# Patient Record
Sex: Female | Born: 1985
Health system: Southern US, Community
[De-identification: ages and names within clinical notes are randomized; demographics above are authoritative.]

## PROBLEM LIST (undated history)

## (undated) DIAGNOSIS — F4310 Post-traumatic stress disorder, unspecified: Secondary | ICD-10-CM

## (undated) DIAGNOSIS — Z9189 Other specified personal risk factors, not elsewhere classified: Secondary | ICD-10-CM

## (undated) DIAGNOSIS — IMO0002 Reserved for concepts with insufficient information to code with codable children: Secondary | ICD-10-CM

## (undated) DIAGNOSIS — F329 Major depressive disorder, single episode, unspecified: Secondary | ICD-10-CM

## (undated) DIAGNOSIS — F32A Depression, unspecified: Secondary | ICD-10-CM

## (undated) DIAGNOSIS — N12 Tubulo-interstitial nephritis, not specified as acute or chronic: Secondary | ICD-10-CM

## (undated) DIAGNOSIS — F419 Anxiety disorder, unspecified: Secondary | ICD-10-CM

## (undated) DIAGNOSIS — R51 Headache: Secondary | ICD-10-CM

## (undated) DIAGNOSIS — R6250 Unspecified lack of expected normal physiological development in childhood: Secondary | ICD-10-CM

## (undated) DIAGNOSIS — F909 Attention-deficit hyperactivity disorder, unspecified type: Secondary | ICD-10-CM

## (undated) DIAGNOSIS — K219 Gastro-esophageal reflux disease without esophagitis: Secondary | ICD-10-CM

## (undated) DIAGNOSIS — G4730 Sleep apnea, unspecified: Secondary | ICD-10-CM

## (undated) HISTORY — DX: Unspecified lack of expected normal physiological development in childhood: R62.50

## (undated) HISTORY — DX: Depression, unspecified: F32.A

## (undated) HISTORY — DX: Gastro-esophageal reflux disease without esophagitis: K21.9

## (undated) HISTORY — DX: Anxiety disorder, unspecified: F41.9

## (undated) HISTORY — DX: Sleep apnea, unspecified: G47.30

## (undated) HISTORY — DX: Major depressive disorder, single episode, unspecified: F32.9

## (undated) HISTORY — PX: CHOLECYSTECTOMY: SHX55

## (undated) HISTORY — DX: Post-traumatic stress disorder, unspecified: F43.10

## (undated) HISTORY — PX: UMBILICAL HERNIA REPAIR: SHX196

## (undated) HISTORY — PX: WISDOM TOOTH EXTRACTION: SHX21

## (undated) HISTORY — DX: Reserved for concepts with insufficient information to code with codable children: IMO0002

## (undated) HISTORY — DX: Headache: R51

## (undated) HISTORY — DX: Other specified personal risk factors, not elsewhere classified: Z91.89

## (undated) HISTORY — DX: Tubulo-interstitial nephritis, not specified as acute or chronic: N12

## (undated) HISTORY — DX: Attention-deficit hyperactivity disorder, unspecified type: F90.9

## (undated) HISTORY — PX: ENDOMETRIAL ABLATION: SHX621

## (undated) HISTORY — PX: TONSILLECTOMY: SUR1361

---
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
## 2005-08-12 ENCOUNTER — Ambulatory Visit: Payer: Self-pay

## 2005-08-20 ENCOUNTER — Ambulatory Visit: Payer: Self-pay

## 2005-09-27 ENCOUNTER — Emergency Department: Payer: Self-pay | Admitting: Emergency Medicine

## 2005-10-01 ENCOUNTER — Emergency Department: Payer: Self-pay | Admitting: Unknown Physician Specialty

## 2005-11-13 ENCOUNTER — Emergency Department: Payer: Self-pay | Admitting: Emergency Medicine

## 2005-11-15 ENCOUNTER — Emergency Department: Payer: Self-pay | Admitting: Internal Medicine

## 2006-11-29 ENCOUNTER — Emergency Department: Payer: Self-pay | Admitting: Internal Medicine

## 2008-04-23 ENCOUNTER — Emergency Department (HOSPITAL_COMMUNITY): Admission: EM | Admit: 2008-04-23 | Discharge: 2008-04-23 | Payer: Self-pay | Admitting: Emergency Medicine

## 2008-06-12 ENCOUNTER — Emergency Department (HOSPITAL_COMMUNITY): Admission: EM | Admit: 2008-06-12 | Discharge: 2008-06-13 | Payer: Self-pay | Admitting: Emergency Medicine

## 2009-07-17 ENCOUNTER — Ambulatory Visit: Payer: Self-pay | Admitting: Gastroenterology

## 2010-09-06 ENCOUNTER — Emergency Department: Payer: Self-pay | Admitting: Emergency Medicine

## 2010-09-10 DIAGNOSIS — K5909 Other constipation: Secondary | ICD-10-CM | POA: Insufficient documentation

## 2010-10-02 DIAGNOSIS — R635 Abnormal weight gain: Secondary | ICD-10-CM | POA: Insufficient documentation

## 2011-01-08 ENCOUNTER — Ambulatory Visit: Payer: Self-pay | Admitting: Nurse Practitioner

## 2011-01-10 DIAGNOSIS — K449 Diaphragmatic hernia without obstruction or gangrene: Secondary | ICD-10-CM | POA: Insufficient documentation

## 2011-01-27 ENCOUNTER — Ambulatory Visit: Payer: Self-pay | Admitting: Gastroenterology

## 2011-01-31 ENCOUNTER — Ambulatory Visit: Payer: Self-pay | Admitting: Gastroenterology

## 2011-04-01 ENCOUNTER — Ambulatory Visit: Payer: Self-pay | Admitting: Gastroenterology

## 2011-04-03 LAB — PATHOLOGY REPORT

## 2011-07-22 ENCOUNTER — Emergency Department: Payer: Self-pay | Admitting: *Deleted

## 2011-09-03 LAB — URINE MICROSCOPIC-ADD ON

## 2011-09-03 LAB — COMPREHENSIVE METABOLIC PANEL
ALT: 19
Alkaline Phosphatase: 82
BUN: 14
CO2: 25
Calcium: 9.9
GFR calc non Af Amer: >60
Glucose, Bld: 109 — ABNORMAL HIGH
Potassium: 4.7
Sodium: 136

## 2011-09-03 LAB — LIPASE, BLOOD: Lipase: 17

## 2011-09-03 LAB — CBC
HCT: 41.8
Hemoglobin: 14.9
MCHC: 35.5
RBC: 4.36

## 2011-09-03 LAB — URINALYSIS, ROUTINE W REFLEX MICROSCOPIC
Ketones, ur: 40 — AB
Leukocytes, UA: NEGATIVE
Nitrite: NEGATIVE
Protein, ur: 30 — AB
pH: 5.5

## 2011-09-03 LAB — DIFFERENTIAL
Basophils Absolute: 0
Basophils Relative: 0
Eosinophils Absolute: 0
Neutro Abs: 13.9 — ABNORMAL HIGH
Neutrophils Relative %: 86 — ABNORMAL HIGH

## 2011-09-04 LAB — POCT PREGNANCY, URINE
Operator id: 211881
Preg Test, Ur: NEGATIVE

## 2011-10-09 ENCOUNTER — Ambulatory Visit: Payer: Self-pay | Admitting: Otolaryngology

## 2011-10-21 DIAGNOSIS — F4310 Post-traumatic stress disorder, unspecified: Secondary | ICD-10-CM | POA: Insufficient documentation

## 2011-12-12 DIAGNOSIS — E229 Hyperfunction of pituitary gland, unspecified: Secondary | ICD-10-CM | POA: Diagnosis not present

## 2011-12-18 DIAGNOSIS — F330 Major depressive disorder, recurrent, mild: Secondary | ICD-10-CM | POA: Diagnosis not present

## 2011-12-18 DIAGNOSIS — F4310 Post-traumatic stress disorder, unspecified: Secondary | ICD-10-CM | POA: Diagnosis not present

## 2011-12-18 DIAGNOSIS — F70 Mild intellectual disabilities: Secondary | ICD-10-CM | POA: Diagnosis not present

## 2011-12-19 DIAGNOSIS — E229 Hyperfunction of pituitary gland, unspecified: Secondary | ICD-10-CM | POA: Diagnosis not present

## 2011-12-19 DIAGNOSIS — E669 Obesity, unspecified: Secondary | ICD-10-CM | POA: Diagnosis not present

## 2011-12-25 ENCOUNTER — Ambulatory Visit: Payer: Self-pay

## 2011-12-25 DIAGNOSIS — E229 Hyperfunction of pituitary gland, unspecified: Secondary | ICD-10-CM | POA: Diagnosis not present

## 2011-12-25 DIAGNOSIS — Z3200 Encounter for pregnancy test, result unknown: Secondary | ICD-10-CM | POA: Diagnosis not present

## 2011-12-25 LAB — HCG, QUANTITATIVE, PREGNANCY: Beta Hcg, Quant.: <1 m[IU]/mL — ABNORMAL LOW

## 2011-12-30 DIAGNOSIS — F4310 Post-traumatic stress disorder, unspecified: Secondary | ICD-10-CM | POA: Diagnosis not present

## 2011-12-30 DIAGNOSIS — F70 Mild intellectual disabilities: Secondary | ICD-10-CM | POA: Diagnosis not present

## 2011-12-30 DIAGNOSIS — F330 Major depressive disorder, recurrent, mild: Secondary | ICD-10-CM | POA: Diagnosis not present

## 2012-01-06 DIAGNOSIS — R350 Frequency of micturition: Secondary | ICD-10-CM | POA: Diagnosis not present

## 2012-01-06 DIAGNOSIS — R609 Edema, unspecified: Secondary | ICD-10-CM | POA: Diagnosis not present

## 2012-01-06 DIAGNOSIS — R0989 Other specified symptoms and signs involving the circulatory and respiratory systems: Secondary | ICD-10-CM | POA: Diagnosis not present

## 2012-01-06 DIAGNOSIS — R52 Pain, unspecified: Secondary | ICD-10-CM | POA: Diagnosis not present

## 2012-01-06 DIAGNOSIS — R0602 Shortness of breath: Secondary | ICD-10-CM | POA: Diagnosis not present

## 2012-01-06 DIAGNOSIS — R0609 Other forms of dyspnea: Secondary | ICD-10-CM | POA: Diagnosis not present

## 2012-01-06 DIAGNOSIS — R000 Tachycardia, unspecified: Secondary | ICD-10-CM | POA: Diagnosis not present

## 2012-01-06 DIAGNOSIS — R069 Unspecified abnormalities of breathing: Secondary | ICD-10-CM | POA: Diagnosis not present

## 2012-01-09 DIAGNOSIS — E669 Obesity, unspecified: Secondary | ICD-10-CM | POA: Diagnosis not present

## 2012-01-09 DIAGNOSIS — L600 Ingrowing nail: Secondary | ICD-10-CM | POA: Diagnosis not present

## 2012-01-09 DIAGNOSIS — E229 Hyperfunction of pituitary gland, unspecified: Secondary | ICD-10-CM | POA: Diagnosis not present

## 2012-01-09 DIAGNOSIS — N915 Oligomenorrhea, unspecified: Secondary | ICD-10-CM | POA: Diagnosis not present

## 2012-01-09 DIAGNOSIS — Z7721 Contact with and (suspected) exposure to potentially hazardous body fluids: Secondary | ICD-10-CM | POA: Diagnosis not present

## 2012-01-12 DIAGNOSIS — R0989 Other specified symptoms and signs involving the circulatory and respiratory systems: Secondary | ICD-10-CM | POA: Diagnosis not present

## 2012-01-12 DIAGNOSIS — R609 Edema, unspecified: Secondary | ICD-10-CM | POA: Diagnosis not present

## 2012-01-12 DIAGNOSIS — I471 Supraventricular tachycardia, unspecified: Secondary | ICD-10-CM | POA: Diagnosis not present

## 2012-01-12 DIAGNOSIS — R635 Abnormal weight gain: Secondary | ICD-10-CM | POA: Diagnosis not present

## 2012-01-12 DIAGNOSIS — R209 Unspecified disturbances of skin sensation: Secondary | ICD-10-CM | POA: Diagnosis not present

## 2012-01-12 DIAGNOSIS — R0609 Other forms of dyspnea: Secondary | ICD-10-CM | POA: Diagnosis not present

## 2012-01-14 DIAGNOSIS — I5022 Chronic systolic (congestive) heart failure: Secondary | ICD-10-CM | POA: Diagnosis not present

## 2012-01-14 DIAGNOSIS — R0602 Shortness of breath: Secondary | ICD-10-CM | POA: Diagnosis not present

## 2012-01-14 DIAGNOSIS — G4730 Sleep apnea, unspecified: Secondary | ICD-10-CM | POA: Diagnosis not present

## 2012-01-14 DIAGNOSIS — I119 Hypertensive heart disease without heart failure: Secondary | ICD-10-CM | POA: Diagnosis not present

## 2012-01-16 DIAGNOSIS — F70 Mild intellectual disabilities: Secondary | ICD-10-CM | POA: Diagnosis not present

## 2012-01-16 DIAGNOSIS — F4310 Post-traumatic stress disorder, unspecified: Secondary | ICD-10-CM | POA: Diagnosis not present

## 2012-01-16 DIAGNOSIS — F330 Major depressive disorder, recurrent, mild: Secondary | ICD-10-CM | POA: Diagnosis not present

## 2012-01-27 ENCOUNTER — Encounter: Payer: Self-pay | Admitting: Internal Medicine

## 2012-01-27 ENCOUNTER — Ambulatory Visit (INDEPENDENT_AMBULATORY_CARE_PROVIDER_SITE_OTHER): Payer: Medicare Other | Admitting: Internal Medicine

## 2012-01-27 DIAGNOSIS — IMO0001 Reserved for inherently not codable concepts without codable children: Secondary | ICD-10-CM | POA: Diagnosis not present

## 2012-01-27 DIAGNOSIS — J309 Allergic rhinitis, unspecified: Secondary | ICD-10-CM | POA: Diagnosis not present

## 2012-01-27 DIAGNOSIS — R6250 Unspecified lack of expected normal physiological development in childhood: Secondary | ICD-10-CM | POA: Diagnosis not present

## 2012-01-27 DIAGNOSIS — H6690 Otitis media, unspecified, unspecified ear: Secondary | ICD-10-CM | POA: Diagnosis not present

## 2012-01-27 DIAGNOSIS — H6693 Otitis media, unspecified, bilateral: Secondary | ICD-10-CM

## 2012-01-27 DIAGNOSIS — M791 Myalgia, unspecified site: Secondary | ICD-10-CM | POA: Insufficient documentation

## 2012-01-27 MED ORDER — AZITHROMYCIN 250 MG PO TABS: ORAL_TABLET | ORAL | Status: AC

## 2012-01-27 MED ORDER — DIPHENHYDRAMINE HCL 25 MG PO TABS: 25.0000 mg | ORAL_TABLET | Freq: Every evening | ORAL | Status: DC | PRN

## 2012-01-27 NOTE — Assessment & Plan Note (Signed)
Parents description of sneezing at home seems most consistent with allergic rhinitis. They report no improvement with the use of Claritin or Zyrtec. She has also tried nasal steroids with no improvement. Will try Benadryl at bedtime to see if this helps. Followup in 2 weeks.
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
## 2012-01-27 NOTE — Progress Notes (Signed)
Subjective:    Patient ID: Jamie Ross, female    DOB: 07-01-1986, 26 y.o.   MRN: [id number]  HPI 26 year old female with history of developmental delay presents to establish care. She is with her adoptive parents today. They report that she has had complains of diffuse myalgia over the last 5-6 months. It was initially thought that she might have influenza, but testing for this is negative. She was then evaluated by both endocrinology and cardiology and they report that workup was unremarkable. They question whether some of her medications, particularly her psychiatric medications may be contributing. They're unsure what lab work has been completed to date. She has not had fever or chills. She has not had weight loss.  She also complains today of a several day history of bilateral ear pain and sore throat. Her parents note that she frequently coughs, but this cough is nonproductive. They also notes frequent sneezing at night time. They have tried using Claritin and Zyrtec with no improvement. She has not had any recent fever or chills. They note that she has had recurrent ear infections in the past and has responded well to treatment with azithromycin.  Outpatient Encounter Prescriptions as of 01/27/2012  Medication Sig Dispense Refill  . azithromycin (ZITHROMAX Z-PAK) 250 MG tablet Take 2 pills day 1, then 1 pill daily days 2-5  6 each  0  . benzonatate (TESSALON) 200 MG capsule Take 100-200 mg by mouth 3 (three) times daily as needed.      . chlorproMAZINE (THORAZINE) 200 MG tablet Take 200 mg by mouth at bedtime.      . chlorproMAZINE (THORAZINE) 25 MG tablet Take by mouth as directed. 2 tab in AM and 4 tabs at bedtime      . clonazePAM (KLONOPIN) 0.5 MG tablet Take 0.5 mg by mouth 2 (two) times daily.      . diphenhydrAMINE (BENADRYL) 25 MG tablet Take 1 tablet (25 mg total) by mouth at bedtime as needed for allergies.  30 tablet  0  . drospirenone-ethinyl estradiol (LORYNA) 3-0.02 MG  tablet Take 1 tablet by mouth daily.      . DULoxetine (CYMBALTA) 60 MG capsule Take 60 mg by mouth daily.      . furosemide (LASIX) 40 MG tablet Take 40 mg by mouth daily.      . hydrochlorothiazide (MICROZIDE) 12.5 MG capsule Take 12.5 mg by mouth daily.      . pantoprazole (PROTONIX) 40 MG tablet Take 40 mg by mouth 2 (two) times daily.      . polyethylene glycol (MIRALAX / GLYCOLAX) packet Take 17 g by mouth 2 (two) times daily.      . potassium chloride SA (K-DUR,KLOR-CON) 20 MEQ tablet Take 20 mEq by mouth daily.      . prazosin (MINIPRESS) 2 MG capsule Take 2 mg by mouth at bedtime.      . psyllium (METAMUCIL) 58.6 % powder Take 1 packet by mouth 2 (two) times daily.      Marland Kitchen topiramate (TOPAMAX) 25 MG capsule Take 25 mg by mouth 2 (two) times daily.      . traMADol (ULTRAM) 50 MG tablet Take 50 mg by mouth every 6 (six) hours as needed.        Review of Systems  Constitutional: Negative for fever, chills, appetite change, fatigue and unexpected weight change.  HENT: Positive for ear pain and sore throat. Negative for congestion, trouble swallowing, neck pain, voice change and sinus pressure.   Eyes:  Negative for visual disturbance.  Respiratory: Positive for cough. Negative for shortness of breath, wheezing and stridor.   Cardiovascular: Negative for chest pain, palpitations and leg swelling.  Gastrointestinal: Negative for nausea, vomiting, abdominal pain, diarrhea, constipation, blood in stool, abdominal distention and anal bleeding.  Genitourinary: Negative for dysuria and flank pain.  Musculoskeletal: Negative for myalgias, arthralgias and gait problem.  Skin: Negative for color change and rash.  Neurological: Negative for dizziness and headaches.  Hematological: Negative for adenopathy. Does not bruise/bleed easily.  Psychiatric/Behavioral: Positive for behavioral problems, confusion, decreased concentration and agitation. Negative for suicidal ideas, sleep disturbance and  dysphoric mood. The patient is not nervous/anxious.    BP 128/82  Pulse 144  Temp(Src) 98 F (36.7 C) (Oral)  Ht 5' 1.5" (1.562 m)  Wt 208 lb (94.348 kg)  BMI 38.66 kg/m2  SpO2 95%     Objective:   Physical Exam  Constitutional: She is oriented to person, place, and time. She appears well-developed and well-nourished. No distress.  HENT:  Head: Normocephalic and atraumatic.  Right Ear: External ear normal. Tympanic membrane is bulging. Tympanic membrane is not erythematous. A middle ear effusion is present.  Left Ear: External ear normal. Tympanic membrane is bulging. Tympanic membrane is not erythematous. A middle ear effusion is present.  Nose: Nose normal.  Mouth/Throat: Oropharynx is clear and moist. No oropharyngeal exudate.  Eyes: Conjunctivae are normal. Pupils are equal, round, and reactive to light. Right eye exhibits no discharge. Left eye exhibits no discharge. No scleral icterus.  Neck: Normal range of motion. Neck supple. No tracheal deviation present. No thyromegaly present.  Cardiovascular: Normal rate, regular rhythm, normal heart sounds and intact distal pulses.  Exam reveals no gallop and no friction rub.   No murmur heard. Pulmonary/Chest: Effort normal and breath sounds normal. No respiratory distress. She has no wheezes. She has no rales. She exhibits no tenderness.  Musculoskeletal: Normal range of motion. She exhibits no edema and no tenderness.  Lymphadenopathy:    She has no cervical adenopathy.  Neurological: She is alert and oriented to person, place, and time. No cranial nerve deficit. She exhibits normal muscle tone. Coordination normal.  Skin: Skin is warm and dry. No rash noted. She is not diaphoretic. No erythema. No pallor.  Psychiatric: She has a normal mood and affect. Her behavior is normal. Judgment and thought content normal.          Assessment & Plan:

## 2012-01-27 NOTE — Assessment & Plan Note (Signed)
Parents describe symptoms of myalgia which have been persistent over 5-6 months. They note that extensive workup has been completed for this by her endocrinologist and cardiologist. Will request records on this. Question if some of her medications might be contributing. Additional workup to be determined by previous evaluation.

## 2012-01-27 NOTE — Assessment & Plan Note (Signed)
Symptoms and exam consistent with bilateral otitis media. Will treat with azithromycin. Patient will followup in 2 weeks. Her parents will call sooner if any worsening of symptoms.

## 2012-02-02 ENCOUNTER — Encounter: Payer: Self-pay | Admitting: *Deleted

## 2012-02-04 ENCOUNTER — Telehealth: Payer: Self-pay | Admitting: Internal Medicine

## 2012-02-04 NOTE — Telephone Encounter (Signed)
Pt mother called wanting to get ms Guinta an appointment for a mammogram they heard on tv that people 25 should get a mammogram.  Someone called from [employer] called stated that ms Cheese  should get a mammogram they didn't know the person name or what company they were from [phone]

## 2012-02-05 DIAGNOSIS — F330 Major depressive disorder, recurrent, mild: Secondary | ICD-10-CM | POA: Diagnosis not present

## 2012-02-05 DIAGNOSIS — F70 Mild intellectual disabilities: Secondary | ICD-10-CM | POA: Diagnosis not present

## 2012-02-05 DIAGNOSIS — F4310 Post-traumatic stress disorder, unspecified: Secondary | ICD-10-CM | POA: Diagnosis not present

## 2012-02-06 NOTE — Telephone Encounter (Signed)
Mother informed.

## 2012-02-06 NOTE — Telephone Encounter (Signed)
Mammograms are not currently recommended for women aged 25YO.  Current guidelines recommend starting at age 13 or 56.  There are some rare exceptions to this, such as for patients who have had cancer, but these would not apply to her.

## 2012-02-12 ENCOUNTER — Telehealth: Payer: Self-pay | Admitting: *Deleted

## 2012-02-12 ENCOUNTER — Encounter: Payer: Self-pay | Admitting: Internal Medicine

## 2012-02-12 ENCOUNTER — Ambulatory Visit (INDEPENDENT_AMBULATORY_CARE_PROVIDER_SITE_OTHER): Payer: Medicare Other | Admitting: Internal Medicine

## 2012-02-12 VITALS — BP 118/72 | HR 150 | Temp 97.8°F | Wt 212.0 lb

## 2012-02-12 DIAGNOSIS — R079 Chest pain, unspecified: Secondary | ICD-10-CM | POA: Diagnosis not present

## 2012-02-12 DIAGNOSIS — N6459 Other signs and symptoms in breast: Secondary | ICD-10-CM | POA: Diagnosis not present

## 2012-02-12 DIAGNOSIS — D510 Vitamin B12 deficiency anemia due to intrinsic factor deficiency: Secondary | ICD-10-CM | POA: Diagnosis not present

## 2012-02-12 DIAGNOSIS — R000 Tachycardia, unspecified: Secondary | ICD-10-CM | POA: Diagnosis not present

## 2012-02-12 DIAGNOSIS — G589 Mononeuropathy, unspecified: Secondary | ICD-10-CM | POA: Diagnosis not present

## 2012-02-12 DIAGNOSIS — M791 Myalgia, unspecified site: Secondary | ICD-10-CM

## 2012-02-12 DIAGNOSIS — R6250 Unspecified lack of expected normal physiological development in childhood: Secondary | ICD-10-CM

## 2012-02-12 DIAGNOSIS — E039 Hypothyroidism, unspecified: Secondary | ICD-10-CM

## 2012-02-12 DIAGNOSIS — G629 Polyneuropathy, unspecified: Secondary | ICD-10-CM | POA: Insufficient documentation

## 2012-02-12 LAB — CBC WITH DIFFERENTIAL/PLATELET
Basophils Relative: 0.2 % (ref 0.0–3.0)
Eosinophils Relative: 2.2 % (ref 0.0–5.0)
HCT: 36.5 % (ref 36.0–46.0)
Hemoglobin: 12.1 g/dL (ref 12.0–15.0)
MCV: 95.8 fl (ref 78.0–100.0)
Monocytes Absolute: 0.5 10*3/uL (ref 0.1–1.0)
Neutro Abs: 5.5 10*3/uL (ref 1.4–7.7)
Neutrophils Relative %: 66.3 % (ref 43.0–77.0)
RBC: 3.81 Mil/uL — ABNORMAL LOW (ref 3.87–5.11)
WBC: 8.3 10*3/uL (ref 4.5–10.5)

## 2012-02-12 LAB — COMPREHENSIVE METABOLIC PANEL
AST: 24 U/L (ref 0–37)
Alkaline Phosphatase: 85 U/L (ref 39–117)
BUN: 15 mg/dL (ref 6–23)
Calcium: 9.4 mg/dL (ref 8.4–10.5)
Creatinine, Ser: 0.9 mg/dL (ref 0.4–1.2)
Total Bilirubin: 0 mg/dL — ABNORMAL LOW (ref 0.3–1.2)

## 2012-02-12 MED ORDER — GABAPENTIN 100 MG PO CAPS: 100.0000 mg | ORAL_CAPSULE | Freq: Three times a day (TID) | ORAL | Status: DC

## 2012-02-12 NOTE — Assessment & Plan Note (Signed)
Patient was noted by her mother to have inverted nipples. Her mother reports this is new. She has chronic galactorrhea. Breast exam was normal today. Patient has a history of hyperprolactinemia and I suspect this is the cause. Will get ultrasound of both of her breasts. Will then order a mammogram. Followup 2 weeks.

## 2012-02-12 NOTE — Progress Notes (Signed)
Subjective:    Patient ID: Jamie Ross, female    DOB: October 29, 1986, 26 y.o.   MRN: [id number]  HPI 26 year old female with history of developmental delay presents for an acute visit complaining of a month long history of burning and numbness in both of her legs and hands. Her mother reports that this burning pain keeps her awake at night. It is not improved with Tylenol or ibuprofen. She denies any weakness in her legs. She reports that the pain in her legs seems to be worse with the use of Cymbalta. Her family notes that she is planning to discuss this with her psychiatrist at a visit tomorrow. She has not had any fever or chills. Family notes that they do have well water at their home. However, they report this to screen for metals including lead.  Their second concern today is bilateral galactorrhea and change in her nipples. Her mother notes that her nipples have become inverted. She is concerned about the possibility of breast cancer. She has had galactorrhea for many months and has been evaluated by endocrinologist who found hyperprolactinemia. This is thought to be secondary to medications. However, her mother reports the inversion of her nipples is a new finding. She has never had imaging of her breasts. She denies any known palpable masses in her breasts. She denies any other overlying skin changes.  Outpatient Encounter Prescriptions as of 02/12/2012  Medication Sig Dispense Refill  . benzonatate (TESSALON) 200 MG capsule Take 100-200 mg by mouth 3 (three) times daily as needed.      . chlorproMAZINE (THORAZINE) 200 MG tablet Take 200 mg by mouth at bedtime.      . chlorproMAZINE (THORAZINE) 25 MG tablet Take by mouth as directed. 2 tab in AM and 4 tabs at bedtime      . clonazePAM (KLONOPIN) 0.5 MG tablet Take 0.5 mg by mouth 2 (two) times daily.      . diphenhydrAMINE (BENADRYL) 25 MG tablet Take 1 tablet (25 mg total) by mouth at bedtime as needed for allergies.  30 tablet  0  .  drospirenone-ethinyl estradiol (LORYNA) 3-0.02 MG tablet Take 1 tablet by mouth daily.      . DULoxetine (CYMBALTA) 60 MG capsule Take 60 mg by mouth daily.      . furosemide (LASIX) 40 MG tablet Take 40 mg by mouth daily.      . hydrochlorothiazide (MICROZIDE) 12.5 MG capsule Take 12.5 mg by mouth daily.      . pantoprazole (PROTONIX) 40 MG tablet Take 40 mg by mouth 2 (two) times daily.      . polyethylene glycol (MIRALAX / GLYCOLAX) packet Take 17 g by mouth 2 (two) times daily.      . potassium chloride SA (K-DUR,KLOR-CON) 20 MEQ tablet Take 20 mEq by mouth daily.      . prazosin (MINIPRESS) 2 MG capsule Take 2 mg by mouth at bedtime.      . psyllium (METAMUCIL) 58.6 % powder Take 1 packet by mouth 2 (two) times daily.      Marland Kitchen topiramate (TOPAMAX) 25 MG capsule Take 25 mg by mouth 2 (two) times daily.      . traMADol (ULTRAM) 50 MG tablet Take 50 mg by mouth every 6 (six) hours as needed.      . gabapentin (NEURONTIN) 100 MG capsule Take 1 capsule (100 mg total) by mouth 3 (three) times daily.  90 capsule  3    Review of Systems  Constitutional: Positive  for fatigue. Negative for fever, chills, appetite change and unexpected weight change.  HENT: Negative for ear pain, congestion, sore throat, trouble swallowing, neck pain, voice change and sinus pressure.   Eyes: Negative for visual disturbance.  Respiratory: Negative for cough, shortness of breath, wheezing and stridor.   Cardiovascular: Negative for chest pain, palpitations and leg swelling.  Gastrointestinal: Negative for nausea, vomiting, abdominal pain, diarrhea, constipation, blood in stool, abdominal distention and anal bleeding.  Genitourinary: Negative for dysuria and flank pain.  Musculoskeletal: Positive for myalgias. Negative for arthralgias and gait problem.  Skin: Negative for color change and rash.  Neurological: Positive for numbness. Negative for dizziness, weakness and headaches.  Hematological: Negative for adenopathy.  Does not bruise/bleed easily.  Psychiatric/Behavioral: Positive for behavioral problems and dysphoric mood. Negative for suicidal ideas and sleep disturbance. The patient is not nervous/anxious.    BP 118/72  Pulse 150  Temp(Src) 97.8 F (36.6 C) (Oral)  Wt 212 lb (96.163 kg)  SpO2 96%     Objective:   Physical Exam  Constitutional: She is oriented to person, place, and time. She appears well-developed and well-nourished. No distress.  HENT:  Head: Normocephalic and atraumatic.  Right Ear: External ear normal.  Left Ear: External ear normal.  Nose: Nose normal.  Mouth/Throat: Oropharynx is clear and moist. No oropharyngeal exudate.  Eyes: Conjunctivae are normal. Pupils are equal, round, and reactive to light. Right eye exhibits no discharge. Left eye exhibits no discharge. No scleral icterus.  Neck: Normal range of motion. Neck supple. No tracheal deviation present. No thyromegaly present.  Cardiovascular: Normal rate, regular rhythm, normal heart sounds and intact distal pulses.  Exam reveals no gallop and no friction rub.   No murmur heard. Pulmonary/Chest: Effort normal and breath sounds normal. No respiratory distress. She has no wheezes. She has no rales. She exhibits no tenderness.  Musculoskeletal: Normal range of motion. She exhibits no edema and no tenderness.  Lymphadenopathy:    She has no cervical adenopathy.  Neurological: She is alert and oriented to person, place, and time. No cranial nerve deficit. She exhibits normal muscle tone. Coordination normal.  Skin: Skin is warm and dry. No rash noted. She is not diaphoretic. No erythema. No pallor.  Psychiatric: Thought content normal. Her speech is delayed. She is withdrawn. She exhibits a depressed mood. She exhibits abnormal recent memory and abnormal remote memory.          Assessment & Plan:

## 2012-02-12 NOTE — Assessment & Plan Note (Signed)
Patient was noted be tachycardic on exam today. EKG showed sinus tachycardia. Suspect that this is related to pain from neuropathy. Will check TSH, CMP, CBC with labs. We'll have her return to clinic in 2 weeks.

## 2012-02-12 NOTE — Assessment & Plan Note (Signed)
Unclear etiology. Exam is difficult because patient complains of diffuse numbness and is not able to delineate location of symptoms well. Will check TSH, B12, CBC, electrolytes. We will also screen for lead level and check urine for heavy metals. Question of her medications may be contributing to symptoms. Family is planning to meet with her psychiatrist tomorrow to see if any reductions in medications or possible. Given that patient is having difficulty sleeping because of burning pain in her legs and hands, will start Neurontin 100 mg. She will first start this at night and then advance to 3 times daily. She will followup in 2 weeks.

## 2012-02-12 NOTE — Telephone Encounter (Signed)
Spoke with [facility] regarding Lead Level whole blood- they can do lab test out of lavender tube. I called and spoke with Darl Pikes at [facility] and they are to send lavender tube from the [facility] that was sent to them over to [facility] for the lead level. I have already faxed [facility] order req to [facility]. Will you check tomorrow to see if it has been done. Thanks.

## 2012-02-13 ENCOUNTER — Other Ambulatory Visit: Payer: Self-pay | Admitting: *Deleted

## 2012-02-13 DIAGNOSIS — F70 Mild intellectual disabilities: Secondary | ICD-10-CM | POA: Diagnosis not present

## 2012-02-13 DIAGNOSIS — F330 Major depressive disorder, recurrent, mild: Secondary | ICD-10-CM | POA: Diagnosis not present

## 2012-02-13 DIAGNOSIS — F4310 Post-traumatic stress disorder, unspecified: Secondary | ICD-10-CM | POA: Diagnosis not present

## 2012-02-13 NOTE — Telephone Encounter (Signed)
Our lab was closed before I was able to check due to heavy pt volume today. Pt is coming in for [facility] Monday for potassium recheck if you need to draw another lead level at that time.

## 2012-02-16 ENCOUNTER — Other Ambulatory Visit (INDEPENDENT_AMBULATORY_CARE_PROVIDER_SITE_OTHER): Payer: Medicare Other | Admitting: *Deleted

## 2012-02-16 ENCOUNTER — Telehealth: Payer: Self-pay | Admitting: Internal Medicine

## 2012-02-16 ENCOUNTER — Ambulatory Visit: Payer: Self-pay | Admitting: Internal Medicine

## 2012-02-16 DIAGNOSIS — D649 Anemia, unspecified: Secondary | ICD-10-CM | POA: Diagnosis not present

## 2012-02-16 DIAGNOSIS — R928 Other abnormal and inconclusive findings on diagnostic imaging of breast: Secondary | ICD-10-CM | POA: Diagnosis not present

## 2012-02-16 DIAGNOSIS — G589 Mononeuropathy, unspecified: Secondary | ICD-10-CM | POA: Diagnosis not present

## 2012-02-16 DIAGNOSIS — N6459 Other signs and symptoms in breast: Secondary | ICD-10-CM | POA: Diagnosis not present

## 2012-02-16 LAB — LEAD, BLOOD: Lead-Whole Blood: 0.1 ug/dL (ref <10.0)

## 2012-02-16 NOTE — Telephone Encounter (Signed)
Waiting on lab results to call pt/family

## 2012-02-16 NOTE — Telephone Encounter (Signed)
[place] of breasts was normal.

## 2012-02-17 ENCOUNTER — Telehealth: Payer: Self-pay | Admitting: Internal Medicine

## 2012-02-17 DIAGNOSIS — G629 Polyneuropathy, unspecified: Secondary | ICD-10-CM

## 2012-02-17 LAB — BASIC METABOLIC PANEL
CO2: 21 mEq/L (ref 19–32)
Calcium: 8.9 mg/dL (ref 8.4–10.5)
Sodium: 139 mEq/L (ref 135–145)

## 2012-02-17 LAB — PROLACTIN: Prolactin: 89.1 ng/mL

## 2012-02-17 MED ORDER — GABAPENTIN 100 MG PO CAPS: 200.0000 mg | ORAL_CAPSULE | Freq: Three times a day (TID) | ORAL | Status: DC

## 2012-02-17 NOTE — Telephone Encounter (Signed)
Father informed.

## 2012-02-17 NOTE — Telephone Encounter (Signed)
[phone] Dad called wanting to get refill on ms Eades gabapintin [facility] [address] Please call pt when this is called in

## 2012-02-17 NOTE — Telephone Encounter (Signed)
Spoke w/pt's dad. Pt has been taking gabapentin tid x 6 days w/no improvement. OK to increase to 200 mg tid per MD. Father informed and will call back if no improvement.

## 2012-02-18 ENCOUNTER — Ambulatory Visit: Payer: Medicare Other | Admitting: Internal Medicine

## 2012-02-23 ENCOUNTER — Telehealth: Payer: Self-pay | Admitting: Internal Medicine

## 2012-02-23 NOTE — Telephone Encounter (Signed)
Didn't we have them increase Neurontin to 200mg  tid last week? We can have her increase to 300mg  tid. If no improvement, we can set up eval at the [facility] pain clinic.

## 2012-02-23 NOTE — Telephone Encounter (Signed)
Jamie Ross is having a lot of pain in her back , the father was told it was neuropathy . He wants to now if there is something that can be called in for her.

## 2012-02-24 ENCOUNTER — Encounter: Payer: Self-pay | Admitting: Internal Medicine

## 2012-02-24 NOTE — Telephone Encounter (Signed)
Patient and her father came in the office today and her father was informed of Dr. Dan Humphreys instructions.

## 2012-02-25 ENCOUNTER — Encounter: Payer: Self-pay | Admitting: Internal Medicine

## 2012-02-25 ENCOUNTER — Ambulatory Visit (INDEPENDENT_AMBULATORY_CARE_PROVIDER_SITE_OTHER)
Admission: RE | Admit: 2012-02-25 | Discharge: 2012-02-25 | Disposition: A | Discharge status: Home / Self Care | Payer: Medicare Other | Source: Ambulatory Visit | Attending: Internal Medicine | Admitting: Internal Medicine

## 2012-02-25 ENCOUNTER — Ambulatory Visit (INDEPENDENT_AMBULATORY_CARE_PROVIDER_SITE_OTHER): Payer: Medicare Other | Admitting: Internal Medicine

## 2012-02-25 VITALS — BP 120/82 | HR 120 | Temp 97.9°F | Resp 16 | Wt 208.8 lb

## 2012-02-25 DIAGNOSIS — M5126 Other intervertebral disc displacement, lumbar region: Secondary | ICD-10-CM | POA: Diagnosis not present

## 2012-02-25 DIAGNOSIS — M5430 Sciatica, unspecified side: Secondary | ICD-10-CM | POA: Diagnosis not present

## 2012-02-25 DIAGNOSIS — M549 Dorsalgia, unspecified: Secondary | ICD-10-CM | POA: Diagnosis not present

## 2012-02-25 DIAGNOSIS — M5440 Lumbago with sciatica, unspecified side: Secondary | ICD-10-CM | POA: Insufficient documentation

## 2012-02-25 MED ORDER — HYDROCODONE-ACETAMINOPHEN 7.5-750 MG PO TABS: 1.0000 | ORAL_TABLET | Freq: Four times a day (QID) | ORAL | Status: AC | PRN

## 2012-02-25 MED ORDER — PREDNISONE 10 MG PO TABS: ORAL_TABLET | ORAL | Status: DC

## 2012-02-25 NOTE — Patient Instructions (Signed)
Your back pain may be coming from a herniated disk.    I am sending you to the [facility] for you rx rays of your spine.   Take the prednisone for the next 8 days.,  Start with 6 tablets daily for the first 3 days,  Then decrease to 5 tablets for Day 4,  4 tablets on Day 5, etc until gone   You may use vicodin or tramadol (up to 4 vicodin daily) for your pain as needed  Take a laxative daily to prevent constipation.

## 2012-02-25 NOTE — Assessment & Plan Note (Addendum)
Progressive , fr the last several weeks radiating to both legs down to the knees.. she has no history of recent trauma or change in activity level. I treated her with prednisone Vicodin and sent her for a plain lumbar spine films which did not show any acute fractures. There was mild spondylosis of L4 on L5 but no evidence of severe severe disc disease.  Wil  recommend that she followup with Dr. Dan Humphreys to 3 weeks and consider physical therapy at that time patient if symptoms are still present.
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
## 2012-02-26 LAB — HEAVY METALS SCREEN, URINE: Mercury 24 Hr Urine: <2 mcg/L (ref <21)

## 2012-03-01 ENCOUNTER — Ambulatory Visit (INDEPENDENT_AMBULATORY_CARE_PROVIDER_SITE_OTHER): Payer: Medicare Other | Admitting: Internal Medicine

## 2012-03-01 ENCOUNTER — Encounter: Payer: Self-pay | Admitting: Internal Medicine

## 2012-03-01 VITALS — BP 138/82 | HR 130 | Temp 98.2°F | Ht 61.5 in | Wt 206.0 lb

## 2012-03-01 DIAGNOSIS — M5430 Sciatica, unspecified side: Secondary | ICD-10-CM | POA: Diagnosis not present

## 2012-03-01 DIAGNOSIS — N390 Urinary tract infection, site not specified: Secondary | ICD-10-CM | POA: Diagnosis not present

## 2012-03-01 DIAGNOSIS — M545 Low back pain, unspecified: Secondary | ICD-10-CM | POA: Diagnosis not present

## 2012-03-01 DIAGNOSIS — G589 Mononeuropathy, unspecified: Secondary | ICD-10-CM | POA: Diagnosis not present

## 2012-03-01 DIAGNOSIS — G629 Polyneuropathy, unspecified: Secondary | ICD-10-CM

## 2012-03-01 DIAGNOSIS — M5440 Lumbago with sciatica, unspecified side: Secondary | ICD-10-CM

## 2012-03-01 LAB — POCT URINALYSIS DIPSTICK
Bilirubin, UA: NEGATIVE
Glucose, UA: 100
Leukocytes, UA: NEGATIVE
Nitrite, UA: NEGATIVE

## 2012-03-01 MED ORDER — CYCLOBENZAPRINE HCL 5 MG PO TABS: 5.0000 mg | ORAL_TABLET | Freq: Three times a day (TID) | ORAL | Status: AC | PRN

## 2012-03-01 MED ORDER — CIPROFLOXACIN HCL 500 MG PO TABS: 500.0000 mg | ORAL_TABLET | Freq: Two times a day (BID) | ORAL | Status: AC

## 2012-03-01 MED ORDER — MELOXICAM 15 MG PO TABS: 15.0000 mg | ORAL_TABLET | Freq: Every day | ORAL | Status: DC

## 2012-03-01 NOTE — Assessment & Plan Note (Signed)
Some improvement in symptoms with use of neurontin. Will continue.

## 2012-03-01 NOTE — Assessment & Plan Note (Signed)
UA pos for blood. Will send for culture and treat empirically with Cipro.

## 2012-03-01 NOTE — Assessment & Plan Note (Signed)
No improvement with prednisone and vicodin. Plain xray was normal.  Will try adding meloxicam and prn flexeril.  Will set up for PT and with Dr. Yves Dill at [facility] for evaluation. Follow up 1 month.

## 2012-03-01 NOTE — Progress Notes (Signed)
Addended by: Jobie Quaker on: 03/01/2012 02:32 PM   Modules accepted: Orders

## 2012-03-01 NOTE — Progress Notes (Signed)
Subjective:    Patient ID: Jamie Ross, female    DOB: 11/13/1986, 26 y.o.   MRN: [id number]  HPI 25YO female with developmental delay presents to follow up lower back pain.  Pt reports no improvement with prednisone and prn vicodin. Also taking Tramadol and Neurontin with no improvement. Pain is located in lower back, radiates to both hips.  Waking her up from sleep several times per night.  No falls, weakness. No fever, chills. Pt does not dysuria.  Outpatient Encounter Prescriptions as of 03/01/2012  Medication Sig Dispense Refill  . chlorproMAZINE (THORAZINE) 25 MG tablet Take by mouth as directed. 2 tab in AM and 4 tabs at bedtime      . clonazePAM (KLONOPIN) 0.5 MG tablet Take 0.5 mg by mouth 2 (two) times daily.      . drospirenone-ethinyl estradiol (LORYNA) 3-0.02 MG tablet Take 1 tablet by mouth daily.      . DULoxetine (CYMBALTA) 60 MG capsule Take 60 mg by mouth daily.      Marland Kitchen gabapentin (NEURONTIN) 100 MG capsule Take 300 mg by mouth 3 (three) times daily.      Marland Kitchen HYDROcodone-acetaminophen (VICODIN ES) 7.5-750 MG per tablet Take 1 tablet by mouth every 6 (six) hours as needed for pain.  30 tablet  0  . pantoprazole (PROTONIX) 40 MG tablet Take 40 mg by mouth 2 (two) times daily.      . polyethylene glycol (MIRALAX / GLYCOLAX) packet Take 17 g by mouth 2 (two) times daily.      . prazosin (MINIPRESS) 2 MG capsule Take 2 mg by mouth at bedtime.      . predniSONE (DELTASONE) 10 MG tablet 6 tablets   Daily for 3 days, then begin to taper by 1 tablet daily until gone ( 5-4-3-2-1-) total of  8 days  32 tablet  0  . topiramate (TOPAMAX) 25 MG capsule Take 25 mg by mouth 2 (two) times daily.      Marland Kitchen DISCONTD: benzonatate (TESSALON) 200 MG capsule Take 100-200 mg by mouth 3 (three) times daily as needed.      Marland Kitchen DISCONTD: traMADol (ULTRAM) 50 MG tablet Take 50 mg by mouth every 6 (six) hours as needed.      . cyclobenzaprine (FLEXERIL) 5 MG tablet Take 1 tablet (5 mg total) by mouth 3  (three) times daily as needed for muscle spasms.  30 tablet  1  . meloxicam (MOBIC) 15 MG tablet Take 1 tablet (15 mg total) by mouth daily.  30 tablet  0    Review of Systems  Constitutional: Negative for fever, chills, appetite change, fatigue and unexpected weight change.  HENT: Negative for neck pain.   Eyes: Negative for visual disturbance.  Respiratory: Negative for shortness of breath.   Cardiovascular: Negative for chest pain, palpitations and leg swelling.  Gastrointestinal: Negative for nausea, vomiting, abdominal pain, diarrhea, constipation, blood in stool, abdominal distention and anal bleeding.  Genitourinary: Positive for dysuria. Negative for urgency, hematuria, flank pain, difficulty urinating and pelvic pain.  Musculoskeletal: Positive for myalgias, back pain and arthralgias. Negative for gait problem.  Skin: Negative for color change and rash.  Neurological: Negative for dizziness, weakness and headaches.  Hematological: Negative for adenopathy. Does not bruise/bleed easily.  Psychiatric/Behavioral: Positive for behavioral problems and decreased concentration. Negative for suicidal ideas, sleep disturbance and dysphoric mood. The patient is nervous/anxious.    BP 138/82  Pulse 130  Temp(Src) 98.2 F (36.8 C) (Oral)  Ht 5' 1.5" (1.562  m)  Wt 206 lb (93.441 kg)  BMI 38.29 kg/m2  SpO2 98%     Objective:   Physical Exam  Constitutional: She is oriented to person, place, and time. She appears well-developed and well-nourished. No distress.  HENT:  Head: Normocephalic and atraumatic.  Right Ear: External ear normal.  Left Ear: External ear normal.  Nose: Nose normal.  Mouth/Throat: Oropharynx is clear and moist. No oropharyngeal exudate.  Eyes: Conjunctivae are normal. Pupils are equal, round, and reactive to light. Right eye exhibits no discharge. Left eye exhibits no discharge. No scleral icterus.  Neck: Normal range of motion. Neck supple. No tracheal deviation  present. No thyromegaly present.  Cardiovascular: Normal rate, regular rhythm, normal heart sounds and intact distal pulses.  Exam reveals no gallop and no friction rub.   No murmur heard. Pulmonary/Chest: Effort normal and breath sounds normal. No respiratory distress. She has no wheezes. She has no rales. She exhibits no tenderness.  Musculoskeletal: She exhibits no edema and no tenderness.       Lumbar back: She exhibits decreased range of motion, tenderness, pain and spasm.  Lymphadenopathy:    She has no cervical adenopathy.  Neurological: She is alert and oriented to person, place, and time. No cranial nerve deficit. She exhibits normal muscle tone. Coordination normal.  Skin: Skin is warm and dry. No rash noted. She is not diaphoretic. No erythema. No pallor.  Psychiatric: She has a normal mood and affect. Her behavior is normal. Judgment and thought content normal.          Assessment & Plan:

## 2012-03-02 ENCOUNTER — Encounter: Payer: Self-pay | Admitting: Internal Medicine

## 2012-03-03 LAB — URINE CULTURE

## 2012-03-04 ENCOUNTER — Other Ambulatory Visit (INDEPENDENT_AMBULATORY_CARE_PROVIDER_SITE_OTHER): Payer: Medicare Other | Admitting: *Deleted

## 2012-03-04 DIAGNOSIS — N390 Urinary tract infection, site not specified: Secondary | ICD-10-CM | POA: Diagnosis not present

## 2012-03-04 LAB — POCT URINALYSIS DIPSTICK
Protein, UA: NEGATIVE
Spec Grav, UA: 1.01
Urobilinogen, UA: 0.2
pH, UA: 6

## 2012-03-08 ENCOUNTER — Telehealth: Payer: Self-pay | Admitting: Internal Medicine

## 2012-03-08 ENCOUNTER — Ambulatory Visit: Payer: Medicare Other | Admitting: Internal Medicine

## 2012-03-08 NOTE — Telephone Encounter (Signed)
Patient has a head cold and her dad would like something called in.  I advised him we didn't typically call anything in, however he states patient has been in recently.  No fever just congestion in her chest.

## 2012-03-08 NOTE — Telephone Encounter (Signed)
I spoke with Jamie Ross and gave him the information that Dr. Dan Humphreys stated about cipro.

## 2012-03-08 NOTE — Telephone Encounter (Signed)
The cipro that we just put her on would generally cover a sinus infection or mild bronchitis.  If symptoms persistent on this, she will need to be seen, as she may need CXR.

## 2012-03-08 NOTE — Telephone Encounter (Signed)
Urinalysis showed blood, but urine culture was negative. Has pt been seen by urology? We should set up referral for evaluation of hematuria.

## 2012-03-12 ENCOUNTER — Telehealth: Payer: Self-pay | Admitting: *Deleted

## 2012-03-12 DIAGNOSIS — N390 Urinary tract infection, site not specified: Secondary | ICD-10-CM

## 2012-03-12 NOTE — Telephone Encounter (Signed)
Referral

## 2012-03-18 DIAGNOSIS — M5137 Other intervertebral disc degeneration, lumbosacral region: Secondary | ICD-10-CM | POA: Diagnosis not present

## 2012-03-18 DIAGNOSIS — IMO0002 Reserved for concepts with insufficient information to code with codable children: Secondary | ICD-10-CM | POA: Diagnosis not present

## 2012-03-22 ENCOUNTER — Telehealth: Payer: Self-pay | Admitting: Internal Medicine

## 2012-03-22 ENCOUNTER — Ambulatory Visit: Payer: Self-pay | Admitting: Physical Medicine and Rehabilitation

## 2012-03-22 DIAGNOSIS — M545 Low back pain, unspecified: Secondary | ICD-10-CM | POA: Diagnosis not present

## 2012-03-22 DIAGNOSIS — M79609 Pain in unspecified limb: Secondary | ICD-10-CM | POA: Diagnosis not present

## 2012-03-22 NOTE — Telephone Encounter (Signed)
i checking with [facility] about [facility] mammogram schedule at same time as her ultra sound.  Per scheduling they didn't do mammogram because no significant mass was found can we cancel order

## 2012-03-22 NOTE — Telephone Encounter (Signed)
Benadryl is available over the counter.  If it is cheaper to get by prescription, we can call in Benadryl 25mg  po tid prn cough, allergies.  She should NOT take this at the same time as atarax (which she was taking in the past) as they are similar medications.

## 2012-03-22 NOTE — Telephone Encounter (Signed)
Spoke w/father. Pt c/o clear sinus drainage and cough. I advised benadryl and or claritin/zyrtec/allegra for allergies and drainage. Also advised she try robitussin or delsym for cough. He will bring pt in or call w/any change in symptoms.

## 2012-03-22 NOTE — Telephone Encounter (Signed)
[phone] Dad called stated pt had cold/cough he wanted to know if you could call in benadryll for her.  He would like to pick this up today  [facility] [address]

## 2012-03-22 NOTE — Telephone Encounter (Signed)
That is fine 

## 2012-03-22 NOTE — Telephone Encounter (Signed)
Left mess to call office back.   

## 2012-03-24 ENCOUNTER — Telehealth: Payer: Self-pay | Admitting: Internal Medicine

## 2012-03-24 NOTE — Telephone Encounter (Signed)
I do not yet have the results of this MRI. Can we request it?

## 2012-03-24 NOTE — Telephone Encounter (Signed)
[phone] Mr silverio wanted to know if the results of the mri done on her back 4/15 [facility]

## 2012-03-25 NOTE — Telephone Encounter (Signed)
Patient's father wanting to know results before you leave today.

## 2012-03-25 NOTE — Telephone Encounter (Signed)
MRI lumbar spine was normal.

## 2012-03-26 DIAGNOSIS — F4310 Post-traumatic stress disorder, unspecified: Secondary | ICD-10-CM | POA: Diagnosis not present

## 2012-03-26 DIAGNOSIS — F330 Major depressive disorder, recurrent, mild: Secondary | ICD-10-CM | POA: Diagnosis not present

## 2012-03-26 DIAGNOSIS — F70 Mild intellectual disabilities: Secondary | ICD-10-CM | POA: Diagnosis not present

## 2012-03-26 NOTE — Telephone Encounter (Signed)
Left detailed message notifying father.

## 2012-03-29 ENCOUNTER — Ambulatory Visit (INDEPENDENT_AMBULATORY_CARE_PROVIDER_SITE_OTHER): Payer: Medicare Other | Admitting: Internal Medicine

## 2012-03-29 ENCOUNTER — Ambulatory Visit: Payer: Self-pay | Admitting: Internal Medicine

## 2012-03-29 ENCOUNTER — Encounter: Payer: Self-pay | Admitting: Internal Medicine

## 2012-03-29 VITALS — BP 118/79 | HR 120 | Temp 97.8°F | Ht 61.5 in | Wt 211.0 lb

## 2012-03-29 DIAGNOSIS — H9209 Otalgia, unspecified ear: Secondary | ICD-10-CM | POA: Diagnosis not present

## 2012-03-29 DIAGNOSIS — R109 Unspecified abdominal pain: Secondary | ICD-10-CM | POA: Diagnosis not present

## 2012-03-29 DIAGNOSIS — R110 Nausea: Secondary | ICD-10-CM | POA: Diagnosis not present

## 2012-03-29 LAB — POCT URINALYSIS DIPSTICK
Bilirubin, UA: NEGATIVE
Glucose, UA: NEGATIVE
Ketones, UA: NEGATIVE
Leukocytes, UA: NEGATIVE
Spec Grav, UA: 1.015

## 2012-03-29 MED ORDER — ONDANSETRON 8 MG PO TBDP: 8.0000 mg | ORAL_TABLET | Freq: Three times a day (TID) | ORAL | Status: AC | PRN

## 2012-03-29 MED ORDER — HYDROCODONE-ACETAMINOPHEN 7.5-325 MG PO TABS: 0.5000 | ORAL_TABLET | Freq: Two times a day (BID) | ORAL | Status: DC | PRN

## 2012-03-29 NOTE — Assessment & Plan Note (Signed)
Likely secondary to viral gastroenteritis given father recently had similar symptoms. Will treat supportively with anti-emetics, increased po fluid as tolerated.  Follow up prn if symptoms persist, worsen, or unable to keep down liquids.

## 2012-03-29 NOTE — Assessment & Plan Note (Signed)
Exam is normal. Perhaps increased middle ear pressure with vomiting and viral syndrome. Will use prn tylenol.

## 2012-03-29 NOTE — Assessment & Plan Note (Signed)
Persistent lower back and bilateral flank pain. Urinalysis remarkable for blood. Will get stat CT abdomen without contrast to look for stone.

## 2012-03-29 NOTE — Progress Notes (Signed)
Subjective:    Patient ID: Jamie Ross, female    DOB: 1986-02-10, 26 y.o.   MRN: [id number]  HPI 26 year old female with developmental delay presents for acute visit complaining of approximately one week history of nausea and vomiting. Her father has been ill with similar symptoms. Her symptoms have been most severe yesterday and today. She has been vomiting food particles with some clear emesis this morning. Her parents deny any blood in her emesis. Her stool is watery and nonbloody. She has not had fever. She does not have abdominal pain. She has been complaining of some clear nasal drainage and bilateral ear pain. She continues to complain of bilateral flank pain and some dysuria. She has not been taking any medications for this.  In regards to her chronic back pain recent MRI of the lumbar spine was normal. Her parents report minimal improvement in her symptoms with use of meloxicam.  Outpatient Encounter Prescriptions as of 03/29/2012  Medication Sig Dispense Refill  . chlorproMAZINE (THORAZINE) 25 MG tablet Take by mouth as directed. 2 tab in AM and 4 tabs at bedtime      . clonazePAM (KLONOPIN) 0.5 MG tablet Take 0.5 mg by mouth 2 (two) times daily.      . diphenhydrAMINE (BENADRYL) 25 MG tablet Take 1 tablet (25 mg total) by mouth at bedtime as needed for allergies.  30 tablet  0  . drospirenone-ethinyl estradiol (LORYNA) 3-0.02 MG tablet Take 1 tablet by mouth daily.      . DULoxetine (CYMBALTA) 60 MG capsule Take 60 mg by mouth daily.      Marland Kitchen gabapentin (NEURONTIN) 100 MG capsule Take 200 mg by mouth 3 (three) times daily.       Marland Kitchen HYDROcodone-acetaminophen (NORCO) 7.5-325 MG per tablet Take 0.5-1 tablets by mouth 2 (two) times daily as needed.  60 tablet  0  . meloxicam (MOBIC) 15 MG tablet Take 1 tablet (15 mg total) by mouth daily.  30 tablet  0  . pantoprazole (PROTONIX) 40 MG tablet Take 40 mg by mouth 2 (two) times daily.      . polyethylene glycol (MIRALAX / GLYCOLAX)  packet Take 17 g by mouth 2 (two) times daily.      . prazosin (MINIPRESS) 2 MG capsule Take 3 mg by mouth at bedtime.       . topiramate (TOPAMAX) 25 MG capsule Take 25 mg by mouth 2 (two) times daily.      Marland Kitchen DISCONTD: HYDROcodone-acetaminophen (NORCO) 7.5-325 MG per tablet Take 0.5-1 tablets by mouth 2 (two) times daily as needed.      . ondansetron (ZOFRAN-ODT) 8 MG disintegrating tablet Take 1 tablet (8 mg total) by mouth every 8 (eight) hours as needed for nausea.  20 tablet  0  . DISCONTD: predniSONE (DELTASONE) 10 MG tablet 6 tablets   Daily for 3 days, then begin to taper by 1 tablet daily until gone ( 5-4-3-2-1-) total of  8 days  32 tablet  0    Review of Systems  Constitutional: Negative for fever, chills, appetite change, fatigue and unexpected weight change.  HENT: Positive for ear pain, congestion, rhinorrhea and postnasal drip. Negative for sore throat, trouble swallowing, neck pain, voice change and sinus pressure.   Eyes: Negative for visual disturbance.  Respiratory: Negative for cough, shortness of breath, wheezing and stridor.   Cardiovascular: Negative for chest pain, palpitations and leg swelling.  Gastrointestinal: Positive for nausea, vomiting and diarrhea. Negative for abdominal pain, constipation, blood in  stool, abdominal distention and anal bleeding.  Genitourinary: Positive for flank pain. Negative for dysuria.  Musculoskeletal: Positive for back pain. Negative for myalgias, arthralgias and gait problem.  Skin: Negative for color change and rash.  Neurological: Negative for dizziness and headaches.  Hematological: Negative for adenopathy. Does not bruise/bleed easily.  Psychiatric/Behavioral: Negative for suicidal ideas, sleep disturbance and dysphoric mood. The patient is not nervous/anxious.    BP 118/79  Pulse 120  Temp(Src) 97.8 F (36.6 C) (Oral)  Ht 5' 1.5" (1.562 m)  Wt 211 lb (95.709 kg)  BMI 39.22 kg/m2     Objective:   Physical Exam    Constitutional: She is oriented to person, place, and time. She appears well-developed and well-nourished. No distress.  HENT:  Head: Normocephalic and atraumatic.  Right Ear: External ear normal.  Left Ear: External ear normal.  Nose: Nose normal.  Mouth/Throat: Oropharynx is clear and moist. No oropharyngeal exudate.  Eyes: Conjunctivae are normal. Pupils are equal, round, and reactive to light. Right eye exhibits no discharge. Left eye exhibits no discharge. No scleral icterus.  Neck: Normal range of motion. Neck supple. No tracheal deviation present. No thyromegaly present.  Cardiovascular: Normal rate, regular rhythm, normal heart sounds and intact distal pulses.  Exam reveals no gallop and no friction rub.   No murmur heard. Pulmonary/Chest: Effort normal and breath sounds normal. No respiratory distress. She has no wheezes. She has no rales. She exhibits no tenderness.  Abdominal: Soft. Bowel sounds are normal. She exhibits no distension. There is no tenderness.  Musculoskeletal: Normal range of motion. She exhibits no edema and no tenderness.       Lumbar back: She exhibits tenderness and pain.  Lymphadenopathy:    She has no cervical adenopathy.  Neurological: She is alert and oriented to person, place, and time. No cranial nerve deficit. She exhibits normal muscle tone. Coordination normal.  Skin: Skin is warm and dry. No rash noted. She is not diaphoretic. No erythema. No pallor.  Psychiatric: Thought content normal. Her mood appears anxious. She is withdrawn. Cognition and memory are impaired. She expresses inappropriate judgment.          Assessment & Plan:

## 2012-03-30 ENCOUNTER — Encounter: Payer: Self-pay | Admitting: Internal Medicine

## 2012-03-31 ENCOUNTER — Encounter: Payer: Self-pay | Admitting: Internal Medicine

## 2012-03-31 LAB — URINE CULTURE: Colony Count: 25000

## 2012-04-01 DIAGNOSIS — IMO0002 Reserved for concepts with insufficient information to code with codable children: Secondary | ICD-10-CM | POA: Diagnosis not present

## 2012-04-01 DIAGNOSIS — R110 Nausea: Secondary | ICD-10-CM | POA: Diagnosis not present

## 2012-04-01 DIAGNOSIS — N390 Urinary tract infection, site not specified: Secondary | ICD-10-CM | POA: Diagnosis not present

## 2012-04-01 DIAGNOSIS — M545 Low back pain, unspecified: Secondary | ICD-10-CM | POA: Diagnosis not present

## 2012-04-01 NOTE — Progress Notes (Signed)
Addended by: Jobie Quaker on: 04/01/2012 10:43 AM   Modules accepted: Orders

## 2012-04-02 ENCOUNTER — Telehealth: Payer: Self-pay | Admitting: Internal Medicine

## 2012-04-02 ENCOUNTER — Encounter: Payer: Self-pay | Admitting: Internal Medicine

## 2012-04-02 NOTE — Telephone Encounter (Signed)
[phone] Pt dad called to see if you received results of urine test done 4/25/[facility]

## 2012-04-02 NOTE — Telephone Encounter (Signed)
We have not received this. I left message stating that we will call once we receive results.

## 2012-04-03 LAB — URINE CULTURE

## 2012-04-07 ENCOUNTER — Telehealth: Payer: Self-pay | Admitting: Internal Medicine

## 2012-04-07 ENCOUNTER — Ambulatory Visit: Payer: Medicare Other | Admitting: Internal Medicine

## 2012-04-07 DIAGNOSIS — F70 Mild intellectual disabilities: Secondary | ICD-10-CM | POA: Diagnosis not present

## 2012-04-07 DIAGNOSIS — F4310 Post-traumatic stress disorder, unspecified: Secondary | ICD-10-CM | POA: Diagnosis not present

## 2012-04-07 DIAGNOSIS — F330 Major depressive disorder, recurrent, mild: Secondary | ICD-10-CM | POA: Diagnosis not present

## 2012-04-07 NOTE — Telephone Encounter (Signed)
Pt dad called  Dr [facility] gave them a rx for lidorm patch pt didn't not mg  They are having a hard time getting the approved through insurance and mr aldriged wanted to know if he could use his fental patch 25mg  for vicotoria Please advise

## 2012-04-07 NOTE — Telephone Encounter (Signed)
No (we discussed this at her last visit). It is not safe for her to use a fentanyl patch.

## 2012-04-07 NOTE — Telephone Encounter (Signed)
Patients father notified.

## 2012-04-07 NOTE — Telephone Encounter (Signed)
Patient notified

## 2012-04-09 DIAGNOSIS — N915 Oligomenorrhea, unspecified: Secondary | ICD-10-CM | POA: Diagnosis not present

## 2012-04-09 DIAGNOSIS — E229 Hyperfunction of pituitary gland, unspecified: Secondary | ICD-10-CM | POA: Diagnosis not present

## 2012-04-09 DIAGNOSIS — Z3200 Encounter for pregnancy test, result unknown: Secondary | ICD-10-CM | POA: Diagnosis not present

## 2012-04-13 ENCOUNTER — Ambulatory Visit: Payer: Self-pay | Admitting: Physical Medicine and Rehabilitation

## 2012-04-13 DIAGNOSIS — M545 Low back pain, unspecified: Secondary | ICD-10-CM | POA: Diagnosis not present

## 2012-04-13 DIAGNOSIS — M79609 Pain in unspecified limb: Secondary | ICD-10-CM | POA: Diagnosis not present

## 2012-04-14 ENCOUNTER — Ambulatory Visit: Payer: Medicare Other | Admitting: Internal Medicine

## 2012-04-14 ENCOUNTER — Telehealth: Payer: Self-pay | Admitting: Internal Medicine

## 2012-04-14 DIAGNOSIS — R319 Hematuria, unspecified: Secondary | ICD-10-CM | POA: Diagnosis not present

## 2012-04-14 NOTE — Telephone Encounter (Signed)
[phone] Pt dad checking on ct scan report done yesterday [facility] location

## 2012-04-14 NOTE — Telephone Encounter (Signed)
i faxed a request to [facility] to get results

## 2012-04-14 NOTE — Telephone Encounter (Signed)
Have you see the results?

## 2012-04-14 NOTE — Telephone Encounter (Signed)
I have not seen this report.

## 2012-04-15 NOTE — Telephone Encounter (Signed)
We received the report, but it was from April and they were already notified of these results. I called and spoke with Dad and he said that this was ordered by a Dr. At [facility] clinic. I advised that he call their office for the results.

## 2012-04-16 ENCOUNTER — Encounter: Payer: Self-pay | Admitting: *Deleted

## 2012-04-16 DIAGNOSIS — N390 Urinary tract infection, site not specified: Secondary | ICD-10-CM | POA: Insufficient documentation

## 2012-04-19 DIAGNOSIS — E669 Obesity, unspecified: Secondary | ICD-10-CM | POA: Diagnosis not present

## 2012-04-19 DIAGNOSIS — N915 Oligomenorrhea, unspecified: Secondary | ICD-10-CM | POA: Diagnosis not present

## 2012-04-19 DIAGNOSIS — E229 Hyperfunction of pituitary gland, unspecified: Secondary | ICD-10-CM | POA: Diagnosis not present

## 2012-04-20 ENCOUNTER — Encounter: Payer: Self-pay | Admitting: Internal Medicine

## 2012-04-20 ENCOUNTER — Ambulatory Visit (INDEPENDENT_AMBULATORY_CARE_PROVIDER_SITE_OTHER): Payer: Medicare Other | Admitting: Internal Medicine

## 2012-04-20 VITALS — BP 110/75 | HR 104 | Temp 98.3°F | Resp 16 | Wt 215.8 lb

## 2012-04-20 DIAGNOSIS — R609 Edema, unspecified: Secondary | ICD-10-CM | POA: Diagnosis not present

## 2012-04-20 DIAGNOSIS — E669 Obesity, unspecified: Secondary | ICD-10-CM | POA: Diagnosis not present

## 2012-04-20 DIAGNOSIS — M5430 Sciatica, unspecified side: Secondary | ICD-10-CM | POA: Diagnosis not present

## 2012-04-20 DIAGNOSIS — J309 Allergic rhinitis, unspecified: Secondary | ICD-10-CM | POA: Diagnosis not present

## 2012-04-20 DIAGNOSIS — R197 Diarrhea, unspecified: Secondary | ICD-10-CM

## 2012-04-20 DIAGNOSIS — M5440 Lumbago with sciatica, unspecified side: Secondary | ICD-10-CM

## 2012-04-20 DIAGNOSIS — J302 Other seasonal allergic rhinitis: Secondary | ICD-10-CM | POA: Insufficient documentation

## 2012-04-20 MED ORDER — LORATADINE 10 MG PO TABS: 10.0000 mg | ORAL_TABLET | Freq: Every day | ORAL | Status: DC

## 2012-04-20 MED ORDER — FUROSEMIDE 20 MG PO TABS: 20.0000 mg | ORAL_TABLET | Freq: Every day | ORAL | Status: DC

## 2012-04-20 MED ORDER — TOPIRAMATE 25 MG PO CPSP: 50.0000 mg | ORAL_CAPSULE | Freq: Two times a day (BID) | ORAL | Status: DC

## 2012-04-20 NOTE — Assessment & Plan Note (Signed)
Likely secondary to chronic venous insufficiency, exacerbated by obesity and medications. Review of recent echocardiogram is normal. Will add Lasix daily for the next 3 days. Her parents will monitor her weight closely. We will recheck electrolytes in 3 days.

## 2012-04-20 NOTE — Assessment & Plan Note (Signed)
Chronic. Will get report on recent CT of the abdomen. Will set up followup with her GI physician. Question if she may need colonoscopy for further evaluation.

## 2012-04-20 NOTE — Progress Notes (Signed)
Subjective:    Patient ID: Jamie Ross, female    DOB: August 28, 1986, 26 y.o.   MRN: [id number]  HPI 26 year old female with history of developmental delay presents for followup. Her parents has several concerns today. First, they report that her back pain has been persistent. She underwent MRI of the lumbar spine which was normal. She is now being followed by orthopedic specialist which has tried some new medications to help with pain. She reports minimal improvement with hydrocodone. She complains of pain with minimal exertion such as walking.  They are also concerned about progressive swelling in her lower extremities. He reports that this is worse in the afternoons. They're concerned about congestive heart failure, however we've reviewed recent echocardiogram which was normal. She does not have shortness of breath. She does not have chest pain.  There also concerned about persistent diarrhea. He reports that she frequently has loose stools. Her stools are nonbloody. She has been evaluated by GI physician who performed CT of the abdomen which was normal. She has never had a colonoscopy. She occasionally has some abdominal distention and bloating.  Outpatient Encounter Prescriptions as of 04/20/2012  Medication Sig Dispense Refill  . albuterol (PROVENTIL HFA;VENTOLIN HFA) 108 (90 BASE) MCG/ACT inhaler Inhale 2 puffs into the lungs every 6 (six) hours as needed.      Marland Kitchen albuterol (PROVENTIL) (2.5 MG/3ML) 0.083% nebulizer solution Take 2.5 mg by nebulization every 6 (six) hours as needed.      . chlorproMAZINE (THORAZINE) 25 MG tablet Take by mouth as directed. 2 tab in AM and 4 tabs at bedtime      . clonazePAM (KLONOPIN) 0.5 MG tablet Take 0.5 mg by mouth 2 (two) times daily.      . diclofenac (FLECTOR) 1.3 % PTCH Place 1 patch onto the skin as directed.      . diphenhydrAMINE (BENADRYL) 25 MG tablet Take 1 tablet (25 mg total) by mouth at bedtime as needed for allergies.  30 tablet  0  .  drospirenone-ethinyl estradiol (LORYNA) 3-0.02 MG tablet Take 1 tablet by mouth daily.      . DULoxetine (CYMBALTA) 60 MG capsule Take 60 mg by mouth daily.      Marland Kitchen gabapentin (NEURONTIN) 100 MG capsule Take 200 mg by mouth 3 (three) times daily.       Marland Kitchen HYDROcodone-acetaminophen (NORCO) 7.5-325 MG per tablet Take 0.5-1 tablets by mouth 2 (two) times daily as needed.  60 tablet  0  . meloxicam (MOBIC) 15 MG tablet Take 1 tablet (15 mg total) by mouth daily.  30 tablet  0  . pantoprazole (PROTONIX) 40 MG tablet Take 40 mg by mouth 2 (two) times daily.      . polyethylene glycol (MIRALAX / GLYCOLAX) packet Take 17 g by mouth 2 (two) times daily.      . prazosin (MINIPRESS) 2 MG capsule Take 3 mg by mouth at bedtime.       . topiramate (TOPAMAX) 25 MG capsule Take 2 capsules (50 mg total) by mouth 2 (two) times daily.  120 capsule  3  . traMADol (ULTRAM) 50 MG tablet Take 50 mg by mouth every 6 (six) hours as needed.      Marland Kitchen DISCONTD: topiramate (TOPAMAX) 25 MG capsule Take 25 mg by mouth 2 (two) times daily.      . furosemide (LASIX) 20 MG tablet Take 1 tablet (20 mg total) by mouth daily.  30 tablet  3  . loratadine (CLARITIN) 10 MG tablet  Take 1 tablet (10 mg total) by mouth daily.  30 tablet  11   Review of Systems  Constitutional: Negative for fever, chills, appetite change, fatigue and unexpected weight change.  HENT: Negative for ear pain, congestion, sore throat, trouble swallowing, neck pain, voice change and sinus pressure.   Eyes: Negative for visual disturbance.  Respiratory: Negative for cough, shortness of breath, wheezing and stridor.   Cardiovascular: Positive for leg swelling. Negative for chest pain and palpitations.  Gastrointestinal: Positive for diarrhea and abdominal distention. Negative for nausea, vomiting, abdominal pain, constipation, blood in stool and anal bleeding.  Genitourinary: Negative for dysuria and flank pain.  Musculoskeletal: Positive for myalgias, back pain and  arthralgias. Negative for gait problem.  Skin: Negative for color change and rash.  Neurological: Negative for dizziness and headaches.  Hematological: Negative for adenopathy. Does not bruise/bleed easily.  Psychiatric/Behavioral: Negative for suicidal ideas, sleep disturbance and dysphoric mood. The patient is not nervous/anxious.    BP 110/75  Pulse 104  Temp(Src) 98.3 F (36.8 C) (Oral)  Resp 16  Wt 215 lb 12 oz (97.864 kg)  SpO2 98%  LMP 03/13/2012     Objective:   Physical Exam  Constitutional: She is oriented to person, place, and time. She appears well-developed and well-nourished. No distress.  HENT:  Head: Normocephalic and atraumatic.  Right Ear: External ear normal.  Left Ear: External ear normal.  Nose: Nose normal.  Mouth/Throat: Oropharynx is clear and moist. No oropharyngeal exudate.  Eyes: Conjunctivae are normal. Pupils are equal, round, and reactive to light. Right eye exhibits no discharge. Left eye exhibits no discharge. No scleral icterus.  Neck: Normal range of motion. Neck supple. No tracheal deviation present. No thyromegaly present.  Cardiovascular: Normal rate, regular rhythm, normal heart sounds and intact distal pulses.  Exam reveals no gallop and no friction rub.   No murmur heard. Pulmonary/Chest: Effort normal and breath sounds normal. No respiratory distress. She has no wheezes. She has no rales. She exhibits no tenderness.  Abdominal: Soft. Bowel sounds are normal.  Musculoskeletal: Normal range of motion. She exhibits no edema and no tenderness.       Lumbar back: She exhibits tenderness and pain. She exhibits normal range of motion.  Lymphadenopathy:    She has no cervical adenopathy.  Neurological: She is alert and oriented to person, place, and time. No cranial nerve deficit. She exhibits normal muscle tone. Coordination normal.  Skin: Skin is warm and dry. No rash noted. She is not diaphoretic. No erythema. No pallor.  Psychiatric: She has a  normal mood and affect. Her behavior is normal. Thought content normal. Her speech is delayed. Cognition and memory are impaired. She expresses impulsivity and inappropriate judgment. She exhibits abnormal recent memory and abnormal remote memory.          Assessment & Plan:

## 2012-04-20 NOTE — Patient Instructions (Signed)
Start Lasix 20mg  daily. Take in the morning. Monitor weight daily. Call if weight changes more than 3lbs.  Increase Topamax to 50mg  twice daily.  Labwork on Thursday.  Follow up in 2-4 weeks.

## 2012-04-20 NOTE — Assessment & Plan Note (Signed)
Symptoms are persistent despite treatment with nonsteroidals and hydrocodone. Encouraged her to continue to follow with orthopedics specialist. There is no finding on MRI lumbar spine to explain significant pain she complains of with exertion. Given her developmental delay, it is difficult to engage her in a discussion of this. Question if she may be complaining of pain to help avoid physical activity.

## 2012-04-20 NOTE — Assessment & Plan Note (Signed)
Weight continues to be elevated. Parents deny any intake of high sugar or high fat foods. Endocrine evaluation has been unremarkable. Question if she may be getting food from another source. Will try increasing Topamax to 50 mg twice daily to help with appetite. Followup one month.

## 2012-04-21 ENCOUNTER — Telehealth: Payer: Self-pay | Admitting: Internal Medicine

## 2012-04-21 NOTE — Telephone Encounter (Signed)
Scheduled patient with Dr. Marva Panda at [facility] on 6.26.13 @ 10:00. Patient's father is aware of appointment.

## 2012-04-22 ENCOUNTER — Other Ambulatory Visit (INDEPENDENT_AMBULATORY_CARE_PROVIDER_SITE_OTHER): Payer: Medicare Other | Admitting: *Deleted

## 2012-04-22 DIAGNOSIS — R609 Edema, unspecified: Secondary | ICD-10-CM | POA: Diagnosis not present

## 2012-04-22 LAB — BASIC METABOLIC PANEL
BUN: 10 mg/dL (ref 6–23)
CO2: 24 mEq/L (ref 19–32)
Calcium: 9.3 mg/dL (ref 8.4–10.5)
Chloride: 104 mEq/L (ref 96–112)
Creatinine, Ser: 0.9 mg/dL (ref 0.4–1.2)

## 2012-04-23 DIAGNOSIS — F4310 Post-traumatic stress disorder, unspecified: Secondary | ICD-10-CM | POA: Diagnosis not present

## 2012-04-23 DIAGNOSIS — F330 Major depressive disorder, recurrent, mild: Secondary | ICD-10-CM | POA: Diagnosis not present

## 2012-04-23 DIAGNOSIS — F70 Mild intellectual disabilities: Secondary | ICD-10-CM | POA: Diagnosis not present

## 2012-04-26 DIAGNOSIS — Q043 Other reduction deformities of brain: Secondary | ICD-10-CM | POA: Diagnosis not present

## 2012-04-26 DIAGNOSIS — IMO0002 Reserved for concepts with insufficient information to code with codable children: Secondary | ICD-10-CM | POA: Diagnosis not present

## 2012-04-26 DIAGNOSIS — M545 Low back pain, unspecified: Secondary | ICD-10-CM | POA: Diagnosis not present

## 2012-04-27 ENCOUNTER — Ambulatory Visit: Payer: Medicare Other | Admitting: Internal Medicine

## 2012-05-05 DIAGNOSIS — M545 Low back pain, unspecified: Secondary | ICD-10-CM | POA: Diagnosis not present

## 2012-05-07 ENCOUNTER — Ambulatory Visit (INDEPENDENT_AMBULATORY_CARE_PROVIDER_SITE_OTHER): Payer: Medicare Other | Admitting: Internal Medicine

## 2012-05-07 ENCOUNTER — Encounter: Payer: Self-pay | Admitting: Internal Medicine

## 2012-05-07 VITALS — BP 141/89 | HR 100 | Temp 97.7°F | Resp 18 | Wt 208.8 lb

## 2012-05-07 DIAGNOSIS — G589 Mononeuropathy, unspecified: Secondary | ICD-10-CM | POA: Diagnosis not present

## 2012-05-07 DIAGNOSIS — R197 Diarrhea, unspecified: Secondary | ICD-10-CM | POA: Diagnosis not present

## 2012-05-07 DIAGNOSIS — J0100 Acute maxillary sinusitis, unspecified: Secondary | ICD-10-CM | POA: Diagnosis not present

## 2012-05-07 DIAGNOSIS — G629 Polyneuropathy, unspecified: Secondary | ICD-10-CM

## 2012-05-07 MED ORDER — SULFAMETHOXAZOLE-TRIMETHOPRIM 800-160 MG PO TABS: 1.0000 | ORAL_TABLET | Freq: Two times a day (BID) | ORAL | Status: AC

## 2012-05-07 MED ORDER — GABAPENTIN 300 MG PO CAPS: 300.0000 mg | ORAL_CAPSULE | Freq: Three times a day (TID) | ORAL | Status: DC

## 2012-05-07 NOTE — Assessment & Plan Note (Addendum)
Symptoms most consistent with acute maxillary sinusitis. Will treat with Bactrim. Patient will call if symptoms are not improving over the next 72 hours.

## 2012-05-07 NOTE — Patient Instructions (Signed)
1. Start new prescription for Neurontin 300mg  three times daily for nerve pain.  2. We will set up referral to Neurology.  3. Follow up with Dr. Ricki Rodriguez in GI Medicine.  4. Start antibiotics for sinus infection.  5. Follow up here in 1 month.

## 2012-05-07 NOTE — Assessment & Plan Note (Signed)
Recent CT of the abdomen was normal. Will set up a followup with her GI physician. Question if she may need colonoscopy for further evaluation. We'll also try to obtain report on stool culture.

## 2012-05-07 NOTE — Assessment & Plan Note (Signed)
Symptoms of burning in the lower extremities are consistent with neuropathy. Evaluation including B12 level, thyroid function, blood sugar level, and testing for heavy metal toxicity were normal. Will set up urology evaluation. Question if she would benefit from EMG testing. Will increase dose of Neurontin to 300 mg 3 times daily. Followup one month.

## 2012-05-07 NOTE — Progress Notes (Signed)
Subjective:    Patient ID: Jamie Ross, female    DOB: [birth year], 26 y.o.   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 She has several concerns today. First, she reports week long history of nasal congestion, sinus pressure, headache, cough productive of purulent sputum. She denies any shortness of breath, fever, chills. She does have chest tightness with cough. She has not taken any medication for this.  She also has persistent watery diarrhea. She has been evaluated by GI medicine and had CT scan which was normal. She also had stool studies which she reports were normal. She does not have followup scheduled with GI physician. She denies any blood in her stool or abdominal pain. Watery stools typically occur several times per day.  She is also concerned today about persistent burning pain in her feet. This is slightly improved with the use of Neurontin, however continues to bother her. She has not been evaluated by neurology. She has had evaluation including testing for B12 deficiency, thyroid dysfunction, and heavy metal toxicity all of which have been normal.  She also notes some persistent swelling in her legs and hands. This has been unchanged with the use of diuretics.  Outpatient Encounter Prescriptions as of 05/07/2012  Medication Sig Dispense Refill  . albuterol (PROVENTIL HFA;VENTOLIN HFA) 108 (90 BASE) MCG/ACT inhaler Inhale 2 puffs into the lungs every 6 (six) hours as needed.      Marland Kitchen albuterol (PROVENTIL) (2.5 MG/3ML) 0.083% nebulizer solution Take 2.5 mg by nebulization every 6 (six) hours as needed.      . chlorproMAZINE (THORAZINE) 25 MG tablet Take by mouth as directed. 2 tab in AM and 4 tabs at bedtime      . clonazePAM (KLONOPIN) 0.5 MG tablet Take 0.5 mg by mouth 2 (two) times daily.      . diclofenac (FLECTOR) 1.3 % PTCH Place 1 patch onto the skin as  directed.      . diphenhydrAMINE (BENADRYL) 25 MG tablet Take 1 tablet (25 mg total) by mouth at bedtime as needed for allergies.  30 tablet  0  . DULoxetine (CYMBALTA) 60 MG capsule Take 60 mg by mouth daily.      . furosemide (LASIX) 20 MG tablet Take 1 tablet (20 mg total) by mouth daily.  30 tablet  3  . gabapentin (NEURONTIN) 300 MG capsule Take 1 capsule (300 mg total) by mouth 3 (three) times daily.  90 capsule  1  . HYDROcodone-acetaminophen (NORCO) 7.5-325 MG per tablet Take 0.5-1 tablets by mouth 2 (two) times daily as needed.  60 tablet  0  . loratadine (CLARITIN) 10 MG tablet Take 1 tablet (10 mg total) by mouth daily.  30 tablet  11  . meloxicam (MOBIC) 15 MG tablet Take 1 tablet (15 mg total) by mouth daily.  30 tablet  0  . pantoprazole (PROTONIX) 40 MG tablet Take 40 mg by mouth 2 (two) times daily.      . polyethylene glycol (MIRALAX / GLYCOLAX) packet Take 17 g by mouth 2 (two) times daily.      . prazosin (MINIPRESS) 2 MG capsule Take 3 mg by mouth at bedtime.       . topiramate (TOPAMAX) 25 MG capsule Take 2 capsules (50 mg total) by mouth 2 (two) times daily.  120 capsule  3  . traMADol (ULTRAM) 50 MG tablet Take 50 mg by mouth every  6 (six) hours as needed.      Marland Kitchen DISCONTD: gabapentin (NEURONTIN) 100 MG capsule Take 200 mg by mouth 3 (three) times daily.       . drospirenone-ethinyl estradiol (LORYNA) 3-0.02 MG tablet Take 1 tablet by mouth daily.      Marland Kitchen sulfamethoxazole-trimethoprim (BACTRIM DS,SEPTRA DS) 800-160 MG per tablet Take 1 tablet by mouth 2 (two) times daily.  20 tablet  0   BP 141/89  Pulse 100  Temp(Src) 97.7 F (36.5 C) (Oral)  Resp 18  Wt 208 lb 12 oz (94.688 kg)  SpO2 97%  LMP 04/22/2012  Review of Systems  Constitutional: Negative for fever, chills, appetite change, fatigue and unexpected weight change.  HENT: Positive for congestion, rhinorrhea, postnasal drip and sinus pressure. Negative for ear pain, sore throat, trouble swallowing, neck pain and  voice change.   Eyes: Negative for visual disturbance.  Respiratory: Positive for cough. Negative for shortness of breath, wheezing and stridor.   Cardiovascular: Positive for leg swelling. Negative for chest pain and palpitations.  Gastrointestinal: Positive for diarrhea. Negative for nausea, vomiting, abdominal pain, constipation, blood in stool, abdominal distention and anal bleeding.  Genitourinary: Negative for dysuria and flank pain.  Musculoskeletal: Positive for myalgias, back pain and arthralgias. Negative for gait problem.  Skin: Negative for color change and rash.  Neurological: Negative for dizziness and headaches.  Hematological: Negative for adenopathy. Does not bruise/bleed easily.  Psychiatric/Behavioral: Negative for suicidal ideas, sleep disturbance and dysphoric mood. The patient is not nervous/anxious.        Objective:   Physical Exam  Constitutional: She is oriented to person, place, and time. She appears well-developed and well-nourished. No distress.  HENT:  Head: Normocephalic and atraumatic.  Right Ear: External ear normal. Tympanic membrane is erythematous and bulging. A middle ear effusion is present.  Left Ear: External ear normal. Tympanic membrane is erythematous and bulging. A middle ear effusion is present.  Nose: Mucosal edema present.  Mouth/Throat: Oropharynx is clear and moist. No oropharyngeal exudate or posterior oropharyngeal erythema.  Eyes: Conjunctivae are normal. Pupils are equal, round, and reactive to light. Right eye exhibits no discharge. Left eye exhibits no discharge. No scleral icterus.  Neck: Normal range of motion. Neck supple. No tracheal deviation present. No thyromegaly present.  Cardiovascular: Normal rate, regular rhythm, normal heart sounds and intact distal pulses.  Exam reveals no gallop and no friction rub.   No murmur heard. Pulmonary/Chest: Effort normal and breath sounds normal. No respiratory distress. She has no wheezes.  She has no rales. She exhibits no tenderness.  Musculoskeletal: Normal range of motion. She exhibits no edema and no tenderness.  Lymphadenopathy:    She has no cervical adenopathy.  Neurological: She is alert and oriented to person, place, and time. No cranial nerve deficit. She exhibits normal muscle tone. Coordination normal.  Skin: Skin is warm and dry. No rash noted. She is not diaphoretic. No erythema. No pallor.  Psychiatric: She has a normal mood and affect. Her behavior is normal. Judgment and thought content normal.          Assessment & Plan:

## 2012-05-10 DIAGNOSIS — M545 Low back pain, unspecified: Secondary | ICD-10-CM | POA: Diagnosis not present

## 2012-05-17 DIAGNOSIS — M545 Low back pain, unspecified: Secondary | ICD-10-CM | POA: Diagnosis not present

## 2012-05-17 DIAGNOSIS — M47817 Spondylosis without myelopathy or radiculopathy, lumbosacral region: Secondary | ICD-10-CM | POA: Diagnosis not present

## 2012-05-17 DIAGNOSIS — IMO0002 Reserved for concepts with insufficient information to code with codable children: Secondary | ICD-10-CM | POA: Diagnosis not present

## 2012-05-24 DIAGNOSIS — F4310 Post-traumatic stress disorder, unspecified: Secondary | ICD-10-CM | POA: Diagnosis not present

## 2012-05-24 DIAGNOSIS — F330 Major depressive disorder, recurrent, mild: Secondary | ICD-10-CM | POA: Diagnosis not present

## 2012-05-24 DIAGNOSIS — F70 Mild intellectual disabilities: Secondary | ICD-10-CM | POA: Diagnosis not present

## 2012-05-25 DIAGNOSIS — M549 Dorsalgia, unspecified: Secondary | ICD-10-CM | POA: Diagnosis not present

## 2012-05-25 DIAGNOSIS — G20 Parkinson's disease: Secondary | ICD-10-CM | POA: Diagnosis not present

## 2012-05-25 DIAGNOSIS — G609 Hereditary and idiopathic neuropathy, unspecified: Secondary | ICD-10-CM | POA: Diagnosis not present

## 2012-05-25 DIAGNOSIS — R209 Unspecified disturbances of skin sensation: Secondary | ICD-10-CM | POA: Diagnosis not present

## 2012-05-25 DIAGNOSIS — G20A1 Parkinson's disease without dyskinesia, without mention of fluctuations: Secondary | ICD-10-CM | POA: Diagnosis not present

## 2012-05-31 DIAGNOSIS — M545 Low back pain, unspecified: Secondary | ICD-10-CM | POA: Diagnosis not present

## 2012-06-02 DIAGNOSIS — R197 Diarrhea, unspecified: Secondary | ICD-10-CM | POA: Diagnosis not present

## 2012-06-02 DIAGNOSIS — M549 Dorsalgia, unspecified: Secondary | ICD-10-CM | POA: Diagnosis not present

## 2012-06-02 DIAGNOSIS — M79609 Pain in unspecified limb: Secondary | ICD-10-CM | POA: Diagnosis not present

## 2012-06-02 DIAGNOSIS — G20 Parkinson's disease: Secondary | ICD-10-CM | POA: Diagnosis not present

## 2012-06-02 DIAGNOSIS — G20A1 Parkinson's disease without dyskinesia, without mention of fluctuations: Secondary | ICD-10-CM | POA: Diagnosis not present

## 2012-06-07 ENCOUNTER — Encounter: Payer: Self-pay | Admitting: Internal Medicine

## 2012-06-07 ENCOUNTER — Ambulatory Visit: Payer: Medicare Other | Admitting: Internal Medicine

## 2012-06-07 ENCOUNTER — Ambulatory Visit (INDEPENDENT_AMBULATORY_CARE_PROVIDER_SITE_OTHER): Payer: Medicare Other | Admitting: Internal Medicine

## 2012-06-07 VITALS — BP 130/80 | HR 86 | Temp 98.1°F | Ht 61.5 in | Wt 206.5 lb

## 2012-06-07 DIAGNOSIS — S81809A Unspecified open wound, unspecified lower leg, initial encounter: Secondary | ICD-10-CM | POA: Diagnosis not present

## 2012-06-07 DIAGNOSIS — Z1211 Encounter for screening for malignant neoplasm of colon: Secondary | ICD-10-CM | POA: Diagnosis not present

## 2012-06-07 DIAGNOSIS — M545 Low back pain, unspecified: Secondary | ICD-10-CM | POA: Diagnosis not present

## 2012-06-07 DIAGNOSIS — Z23 Encounter for immunization: Secondary | ICD-10-CM | POA: Diagnosis not present

## 2012-06-07 DIAGNOSIS — Z1212 Encounter for screening for malignant neoplasm of rectum: Secondary | ICD-10-CM | POA: Diagnosis not present

## 2012-06-07 IMAGING — US US BREAST BILAT
1 series · 18 of 25 positions shown · non-contrast
Comparison: none

REASON FOR EXAM: BILAT NIPPLE INVERT
COMMENTS:

PROCEDURE:     US  - US BREAST BILATERAL  - February 16, 2012  [DATE]
RESULT:     Bilateral periareolar ultrasound was performed. No mass is seen
on either side. A few ducts are visualized bilaterally but no marked ductal
ectasia is identified on either side. No skin thickening is seen.

[Series 1: us breast bilat · 18 of 31 slices shown]
[im 1/31]
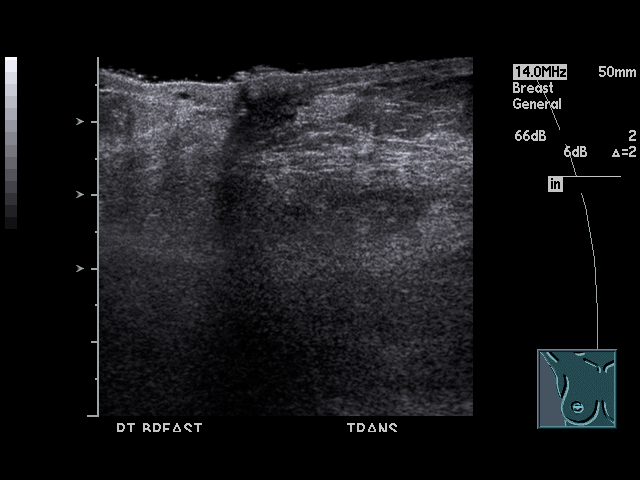
[im 3/31]
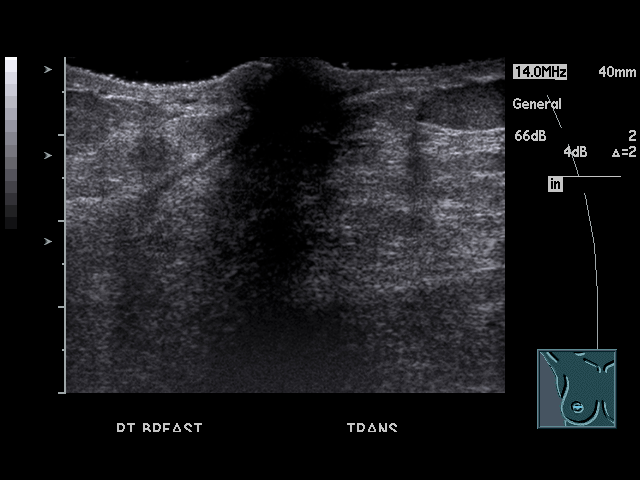
[im 4/31]
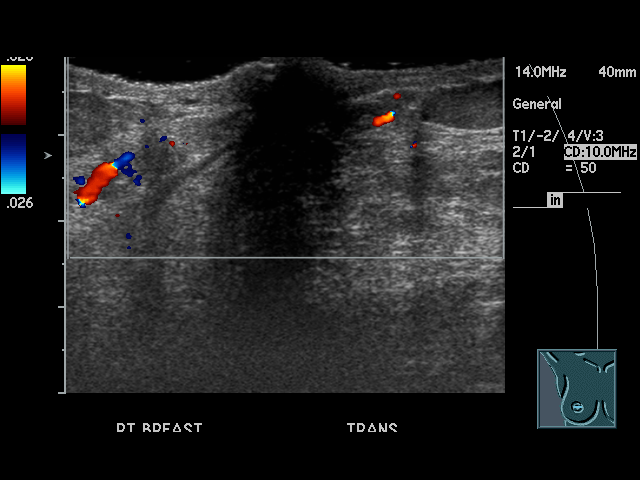
[im 6/31]
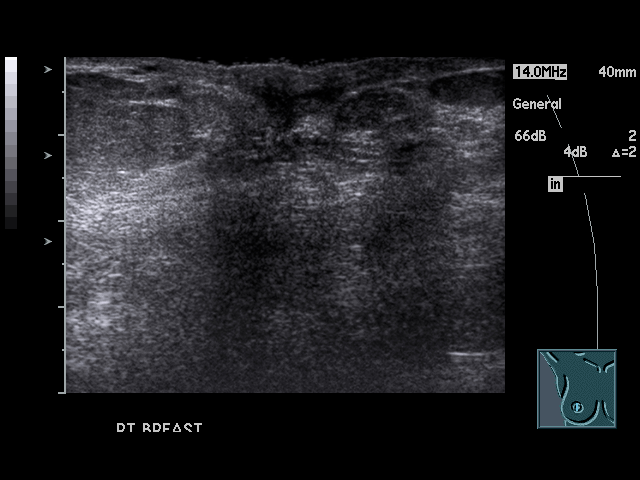
[im 8/31]
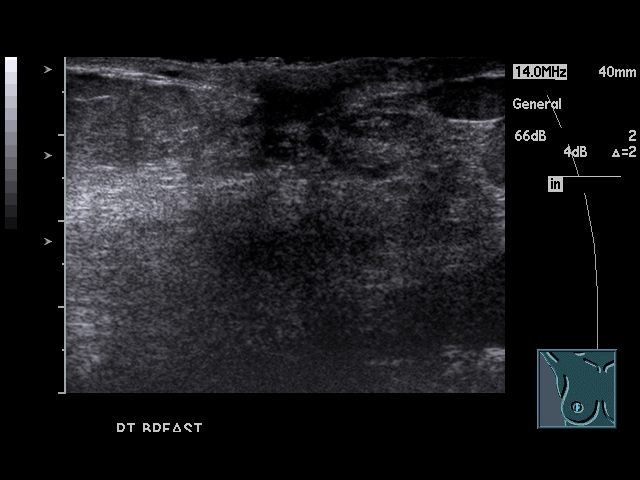
[im 9/31]
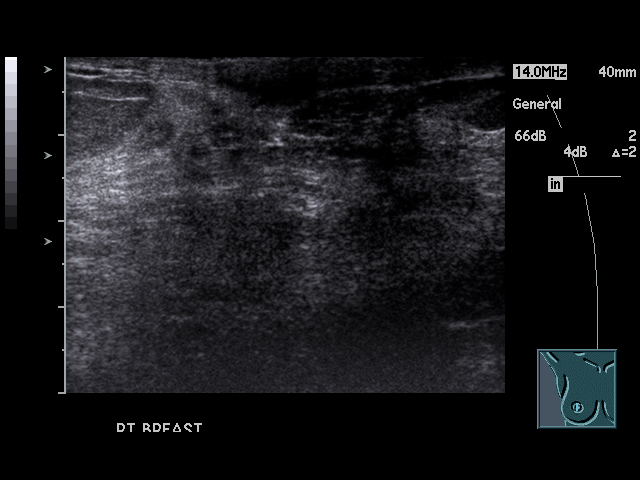
[im 12/31]
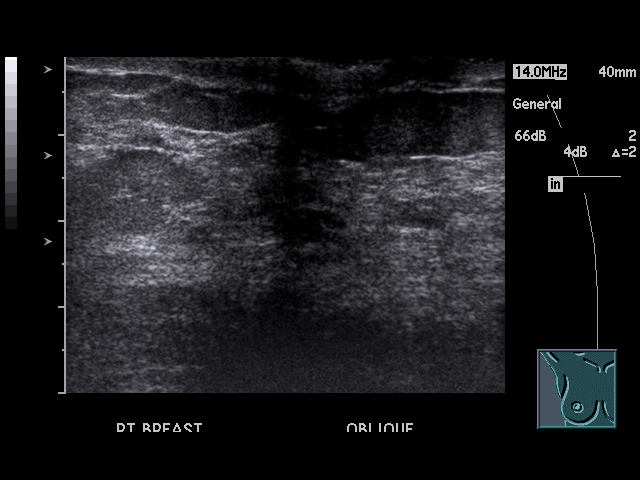
[im 13/31]
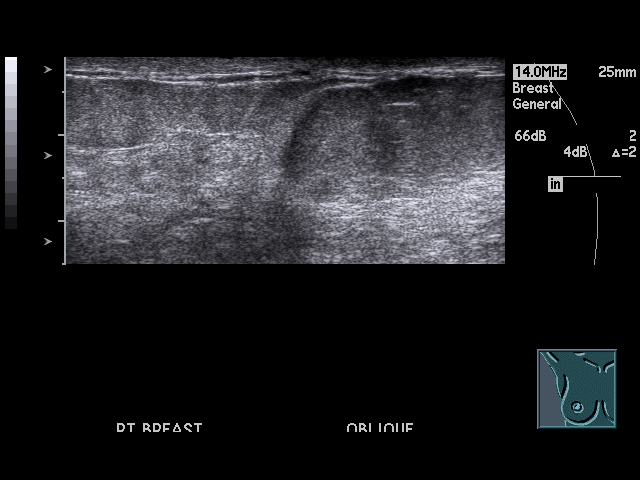
[im 14/31]
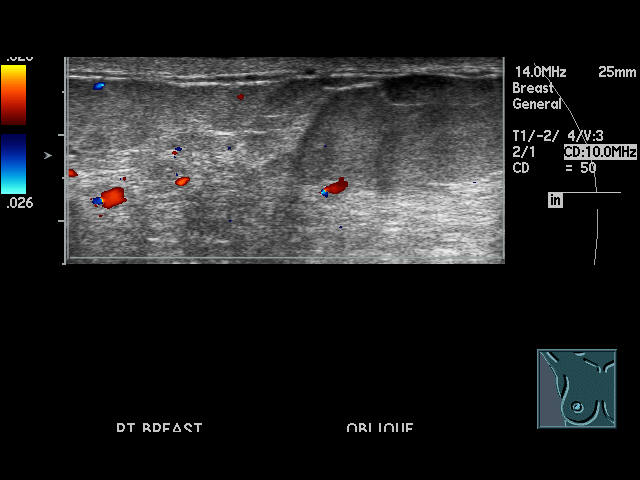
[im 17/31]
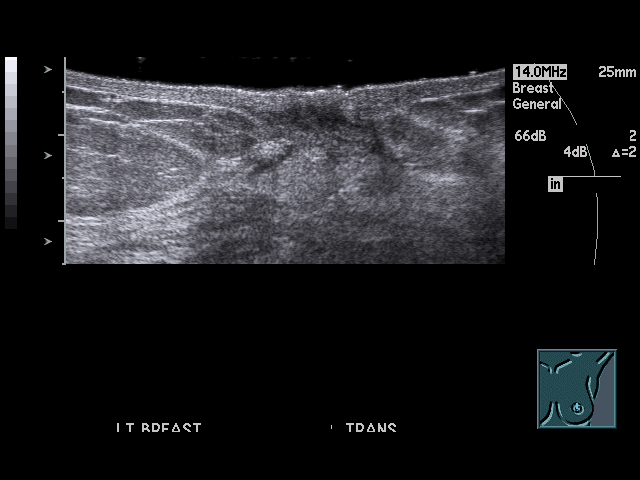
[im 18/31]
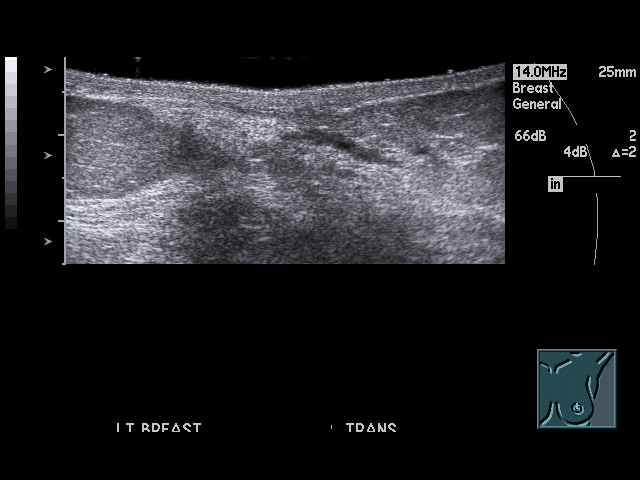
[im 19/31]
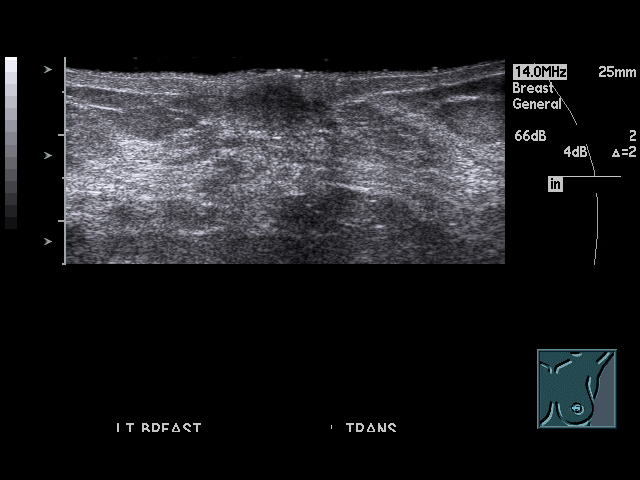
[im 22/31]
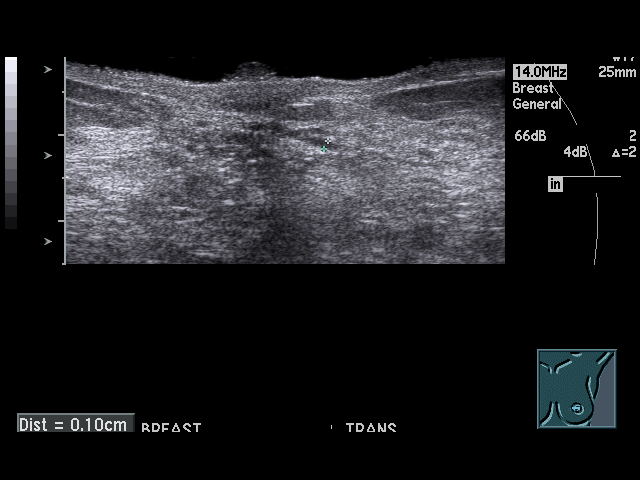
[im 23/31]
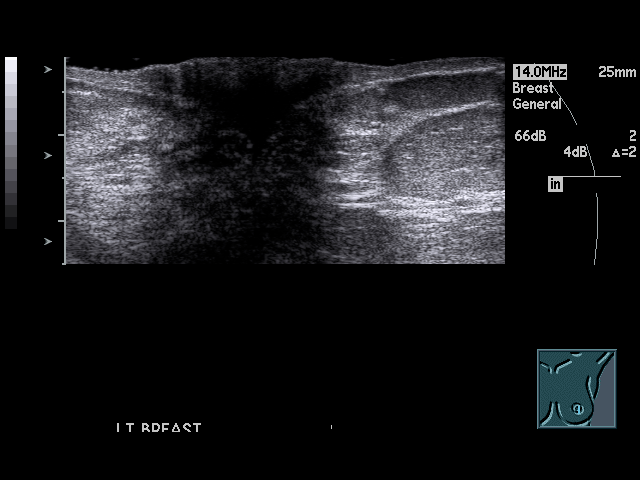
[im 26/31]
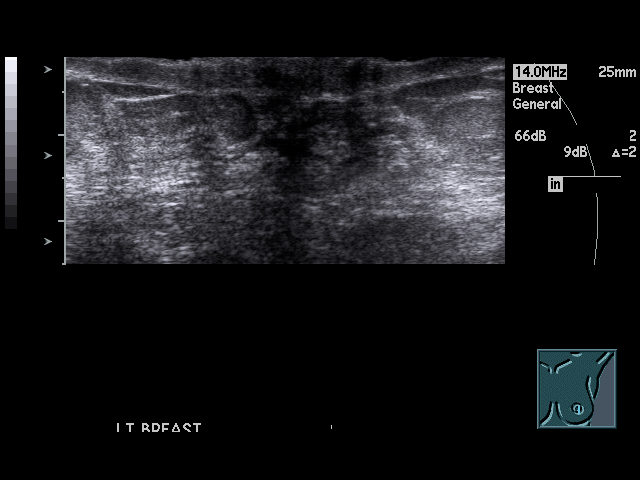
[im 27/31]
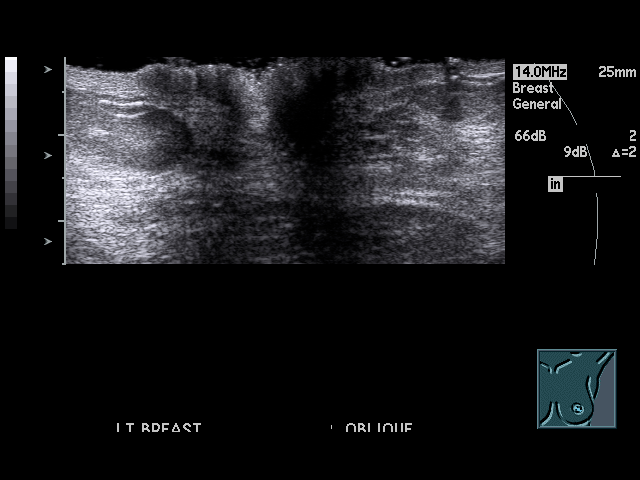
[im 28/31]
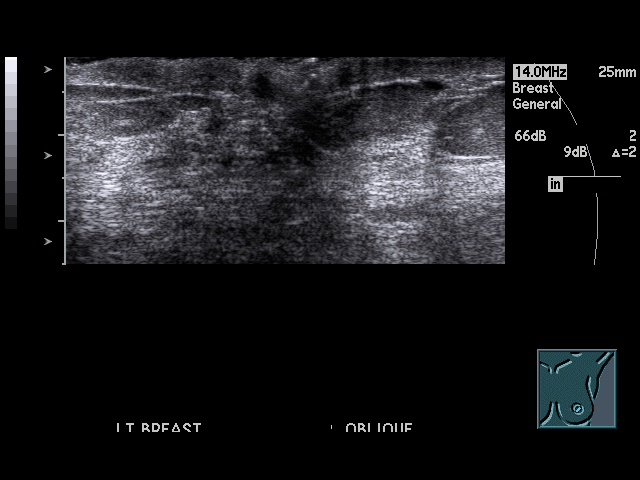
[im 31/31]
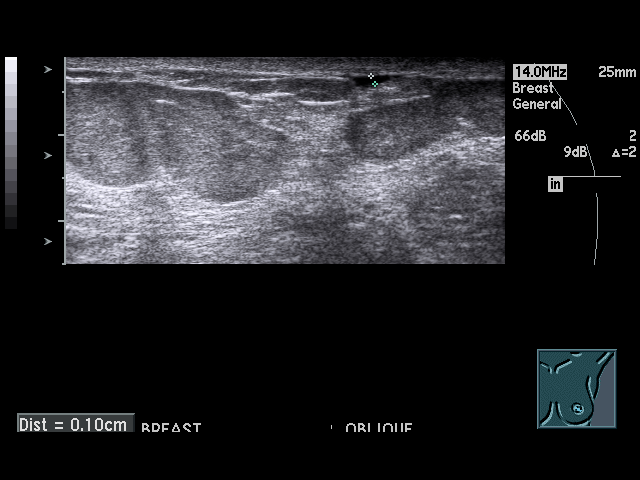

[18 of 25 positions shown; findings below may reference images not displayed]

IMPRESSION: 1.     No significant sonographic abnormalities are identified.

## 2012-06-07 MED ORDER — CIPROFLOXACIN HCL 500 MG PO TABS: 500.0000 mg | ORAL_TABLET | Freq: Two times a day (BID) | ORAL | Status: AC

## 2012-06-07 NOTE — Assessment & Plan Note (Signed)
Wounds of her bilateral lower extremities consistent with abrasions. Applied Telfa and Tegaderm. Will set up with wound healing Center for further care. Tetanus shot given today.

## 2012-06-07 NOTE — Progress Notes (Signed)
Subjective:    Patient ID: Jamie Ross, female    DOB: 02-15-1986, 26 y.o.   MRN: [id number]  HPI 26 YO female with history of cognitive delay, neuropathy, chronic back pain, and chronic diarrhea presents for followup. Her primary concern today is wounds on her knees and ankles. Her parents report that she was walking on a treadmill when she tripped and fell scraping her knees and ankles. They note that this happened Friday, June 26. They report that they have been applying bandages and Ace wraps over the lesions. The lesions have been draining some clear yellow fluid. She has not had any fever or chills. She reports the wounds are painful.  Outpatient Encounter Prescriptions as of 06/07/2012  Medication Sig Dispense Refill  . albuterol (PROVENTIL HFA;VENTOLIN HFA) 108 (90 BASE) MCG/ACT inhaler Inhale 2 puffs into the lungs every 6 (six) hours as needed.      Marland Kitchen albuterol (PROVENTIL) (2.5 MG/3ML) 0.083% nebulizer solution Take 2.5 mg by nebulization every 6 (six) hours as needed.      . chlorproMAZINE (THORAZINE) 25 MG tablet Take by mouth as directed. 2 tab in AM and 4 tabs at bedtime      . clonazePAM (KLONOPIN) 0.5 MG tablet Take 0.5 mg by mouth 2 (two) times daily.      . diclofenac (FLECTOR) 1.3 % PTCH Place 1 patch onto the skin as directed.      . diphenhydrAMINE (BENADRYL) 25 MG tablet Take 1 tablet (25 mg total) by mouth at bedtime as needed for allergies.  30 tablet  0  . drospirenone-ethinyl estradiol (LORYNA) 3-0.02 MG tablet Take 1 tablet by mouth daily.      . DULoxetine (CYMBALTA) 60 MG capsule Take 60 mg by mouth daily.      . furosemide (LASIX) 20 MG tablet Take 1 tablet (20 mg total) by mouth daily.  30 tablet  3  . gabapentin (NEURONTIN) 300 MG capsule Take 1 capsule (300 mg total) by mouth 3 (three) times daily.  90 capsule  1  . HYDROcodone-acetaminophen (NORCO) 7.5-325 MG per tablet Take 0.5-1 tablets by mouth 2 (two) times daily as needed.  60 tablet  0  . loratadine  (CLARITIN) 10 MG tablet Take 1 tablet (10 mg total) by mouth daily.  30 tablet  11  . meloxicam (MOBIC) 15 MG tablet Take 1 tablet (15 mg total) by mouth daily.  30 tablet  0  . pantoprazole (PROTONIX) 40 MG tablet Take 40 mg by mouth 2 (two) times daily.      . polyethylene glycol (MIRALAX / GLYCOLAX) packet Take 17 g by mouth 2 (two) times daily.      . prazosin (MINIPRESS) 2 MG capsule Take 3 mg by mouth at bedtime.       . topiramate (TOPAMAX) 25 MG capsule Take 2 capsules (50 mg total) by mouth 2 (two) times daily.  120 capsule  3  . traMADol (ULTRAM) 50 MG tablet Take 50 mg by mouth every 6 (six) hours as needed.      . ciprofloxacin (CIPRO) 500 MG tablet Take 1 tablet (500 mg total) by mouth 2 (two) times daily.  20 tablet  0    Review of Systems  Constitutional: Negative for fever, chills, appetite change, fatigue and unexpected weight change.  HENT: Negative for ear pain, congestion, sore throat, trouble swallowing, neck pain, voice change and sinus pressure.   Eyes: Negative for visual disturbance.  Respiratory: Negative for cough, shortness of breath, wheezing  and stridor.   Cardiovascular: Negative for chest pain, palpitations and leg swelling.  Gastrointestinal: Positive for diarrhea. Negative for nausea, vomiting, abdominal pain, constipation, blood in stool, abdominal distention and anal bleeding.  Genitourinary: Negative for dysuria and flank pain.  Musculoskeletal: Positive for myalgias, back pain and arthralgias. Negative for gait problem.  Skin: Positive for color change and wound. Negative for rash.  Neurological: Negative for dizziness and headaches.  Hematological: Negative for adenopathy. Does not bruise/bleed easily.  Psychiatric/Behavioral: Negative for suicidal ideas, disturbed wake/sleep cycle and dysphoric mood. The patient is not nervous/anxious.    BP 130/80  Pulse 86  Temp 98.1 F (36.7 C) (Oral)  Ht 5' 1.5" (1.562 m)  Wt 206 lb 8 oz (93.668 kg)  BMI 38.39  kg/m2  SpO2 97%  LMP 05/27/2012     Objective:   Physical Exam  Constitutional: She is oriented to person, place, and time. She appears well-developed and well-nourished. No distress.  HENT:  Head: Normocephalic and atraumatic.  Right Ear: External ear normal.  Left Ear: External ear normal.  Nose: Nose normal.  Mouth/Throat: Oropharynx is clear and moist. No oropharyngeal exudate.  Eyes: Conjunctivae are normal. Pupils are equal, round, and reactive to light. Right eye exhibits no discharge. Left eye exhibits no discharge. No scleral icterus.  Neck: Normal range of motion. Neck supple. No tracheal deviation present. No thyromegaly present.  Cardiovascular: Normal rate, regular rhythm, normal heart sounds and intact distal pulses.  Exam reveals no gallop and no friction rub.   No murmur heard. Pulmonary/Chest: Effort normal and breath sounds normal. No respiratory distress. She has no wheezes. She has no rales. She exhibits no tenderness.  Musculoskeletal: Normal range of motion. She exhibits no edema and no tenderness.  Lymphadenopathy:    She has no cervical adenopathy.  Neurological: She is alert and oriented to person, place, and time. No cranial nerve deficit. She exhibits normal muscle tone. Coordination normal.  Skin: Skin is warm and dry. Abrasion noted. No rash noted. She is not diaphoretic. There is erythema. No pallor.     Psychiatric: She has a normal mood and affect. Her behavior is normal. Judgment and thought content normal.          Assessment & Plan:

## 2012-06-08 ENCOUNTER — Other Ambulatory Visit: Payer: Self-pay | Admitting: Internal Medicine

## 2012-06-08 DIAGNOSIS — S81809A Unspecified open wound, unspecified lower leg, initial encounter: Secondary | ICD-10-CM | POA: Diagnosis not present

## 2012-06-09 ENCOUNTER — Telehealth: Payer: Self-pay | Admitting: Internal Medicine

## 2012-06-09 ENCOUNTER — Encounter (HOSPITAL_BASED_OUTPATIENT_CLINIC_OR_DEPARTMENT_OTHER): Payer: Medicare Other

## 2012-06-09 NOTE — Telephone Encounter (Signed)
Pt dad called checking on referral to the wound center.  Pt dad wanted something in [place].  Would like appointment ASAP

## 2012-06-11 DIAGNOSIS — R197 Diarrhea, unspecified: Secondary | ICD-10-CM | POA: Diagnosis not present

## 2012-06-11 DIAGNOSIS — Z1211 Encounter for screening for malignant neoplasm of colon: Secondary | ICD-10-CM | POA: Diagnosis not present

## 2012-06-11 LAB — WOUND CULTURE

## 2012-06-11 NOTE — Telephone Encounter (Signed)
[facility] will call patient to schedule today.

## 2012-06-14 NOTE — Telephone Encounter (Signed)
Patient's father called back. He cannot get anyone on the phone at the wound center to make an appt, he is requesting that we make an appt for his daughter next week. His best day is Monday 06/21/12 any time if you cannot get one on that day he said any day after the 15th will be okay. You can reach him at [phone].

## 2012-06-15 NOTE — Telephone Encounter (Signed)
Patient scheduled see referral.

## 2012-06-23 ENCOUNTER — Encounter: Payer: Self-pay | Admitting: Nurse Practitioner

## 2012-06-23 ENCOUNTER — Encounter: Payer: Self-pay | Admitting: Cardiothoracic Surgery

## 2012-06-23 DIAGNOSIS — F848 Other pervasive developmental disorders: Secondary | ICD-10-CM | POA: Diagnosis not present

## 2012-06-23 DIAGNOSIS — M545 Low back pain, unspecified: Secondary | ICD-10-CM | POA: Diagnosis not present

## 2012-06-23 DIAGNOSIS — R197 Diarrhea, unspecified: Secondary | ICD-10-CM | POA: Diagnosis not present

## 2012-06-23 DIAGNOSIS — G4730 Sleep apnea, unspecified: Secondary | ICD-10-CM | POA: Diagnosis not present

## 2012-06-23 DIAGNOSIS — E669 Obesity, unspecified: Secondary | ICD-10-CM | POA: Diagnosis not present

## 2012-06-23 DIAGNOSIS — S81009A Unspecified open wound, unspecified knee, initial encounter: Secondary | ICD-10-CM | POA: Diagnosis not present

## 2012-06-23 DIAGNOSIS — S81809A Unspecified open wound, unspecified lower leg, initial encounter: Secondary | ICD-10-CM | POA: Diagnosis not present

## 2012-06-23 DIAGNOSIS — H9120 Sudden idiopathic hearing loss, unspecified ear: Secondary | ICD-10-CM | POA: Diagnosis not present

## 2012-06-28 DIAGNOSIS — M47817 Spondylosis without myelopathy or radiculopathy, lumbosacral region: Secondary | ICD-10-CM | POA: Diagnosis not present

## 2012-06-28 DIAGNOSIS — IMO0002 Reserved for concepts with insufficient information to code with codable children: Secondary | ICD-10-CM | POA: Diagnosis not present

## 2012-07-04 ENCOUNTER — Other Ambulatory Visit: Payer: Self-pay | Admitting: Internal Medicine

## 2012-07-05 DIAGNOSIS — R109 Unspecified abdominal pain: Secondary | ICD-10-CM | POA: Diagnosis not present

## 2012-07-05 DIAGNOSIS — Z79899 Other long term (current) drug therapy: Secondary | ICD-10-CM | POA: Diagnosis not present

## 2012-07-05 DIAGNOSIS — Z5181 Encounter for therapeutic drug level monitoring: Secondary | ICD-10-CM | POA: Diagnosis not present

## 2012-07-05 DIAGNOSIS — F70 Mild intellectual disabilities: Secondary | ICD-10-CM | POA: Diagnosis not present

## 2012-07-05 DIAGNOSIS — F4310 Post-traumatic stress disorder, unspecified: Secondary | ICD-10-CM | POA: Diagnosis not present

## 2012-07-05 DIAGNOSIS — F330 Major depressive disorder, recurrent, mild: Secondary | ICD-10-CM | POA: Diagnosis not present

## 2012-07-05 DIAGNOSIS — Z1339 Encounter for screening examination for other mental health and behavioral disorders: Secondary | ICD-10-CM | POA: Diagnosis not present

## 2012-07-07 ENCOUNTER — Ambulatory Visit: Payer: Medicare Other | Admitting: Internal Medicine

## 2012-07-07 DIAGNOSIS — M545 Low back pain, unspecified: Secondary | ICD-10-CM | POA: Diagnosis not present

## 2012-07-07 DIAGNOSIS — S91009A Unspecified open wound, unspecified ankle, initial encounter: Secondary | ICD-10-CM | POA: Diagnosis not present

## 2012-07-07 DIAGNOSIS — F848 Other pervasive developmental disorders: Secondary | ICD-10-CM | POA: Diagnosis not present

## 2012-07-07 DIAGNOSIS — R109 Unspecified abdominal pain: Secondary | ICD-10-CM | POA: Diagnosis not present

## 2012-07-07 DIAGNOSIS — G4730 Sleep apnea, unspecified: Secondary | ICD-10-CM | POA: Diagnosis not present

## 2012-07-07 DIAGNOSIS — E669 Obesity, unspecified: Secondary | ICD-10-CM | POA: Diagnosis not present

## 2012-07-07 DIAGNOSIS — H9120 Sudden idiopathic hearing loss, unspecified ear: Secondary | ICD-10-CM | POA: Diagnosis not present

## 2012-07-07 DIAGNOSIS — R197 Diarrhea, unspecified: Secondary | ICD-10-CM | POA: Diagnosis not present

## 2012-07-07 DIAGNOSIS — S81009A Unspecified open wound, unspecified knee, initial encounter: Secondary | ICD-10-CM | POA: Diagnosis not present

## 2012-07-08 ENCOUNTER — Encounter: Payer: Self-pay | Admitting: Cardiothoracic Surgery

## 2012-07-08 ENCOUNTER — Encounter: Payer: Self-pay | Admitting: Nurse Practitioner

## 2012-07-08 DIAGNOSIS — E669 Obesity, unspecified: Secondary | ICD-10-CM | POA: Diagnosis not present

## 2012-07-08 DIAGNOSIS — S81009A Unspecified open wound, unspecified knee, initial encounter: Secondary | ICD-10-CM | POA: Diagnosis not present

## 2012-07-08 DIAGNOSIS — G8929 Other chronic pain: Secondary | ICD-10-CM | POA: Diagnosis not present

## 2012-07-08 DIAGNOSIS — R197 Diarrhea, unspecified: Secondary | ICD-10-CM | POA: Diagnosis not present

## 2012-07-08 DIAGNOSIS — G4730 Sleep apnea, unspecified: Secondary | ICD-10-CM | POA: Diagnosis not present

## 2012-07-08 DIAGNOSIS — F848 Other pervasive developmental disorders: Secondary | ICD-10-CM | POA: Diagnosis not present

## 2012-07-09 ENCOUNTER — Ambulatory Visit: Payer: Medicare Other | Admitting: Internal Medicine

## 2012-07-14 ENCOUNTER — Encounter (HOSPITAL_BASED_OUTPATIENT_CLINIC_OR_DEPARTMENT_OTHER): Payer: Medicare Other

## 2012-07-16 DIAGNOSIS — R197 Diarrhea, unspecified: Secondary | ICD-10-CM | POA: Diagnosis not present

## 2012-07-16 DIAGNOSIS — G4730 Sleep apnea, unspecified: Secondary | ICD-10-CM | POA: Diagnosis not present

## 2012-07-16 DIAGNOSIS — G8929 Other chronic pain: Secondary | ICD-10-CM | POA: Diagnosis not present

## 2012-07-16 DIAGNOSIS — S91009A Unspecified open wound, unspecified ankle, initial encounter: Secondary | ICD-10-CM | POA: Diagnosis not present

## 2012-07-16 DIAGNOSIS — E669 Obesity, unspecified: Secondary | ICD-10-CM | POA: Diagnosis not present

## 2012-07-16 DIAGNOSIS — S81809A Unspecified open wound, unspecified lower leg, initial encounter: Secondary | ICD-10-CM | POA: Diagnosis not present

## 2012-07-16 DIAGNOSIS — F848 Other pervasive developmental disorders: Secondary | ICD-10-CM | POA: Diagnosis not present

## 2012-07-16 DIAGNOSIS — S81009A Unspecified open wound, unspecified knee, initial encounter: Secondary | ICD-10-CM | POA: Diagnosis not present

## 2012-07-27 ENCOUNTER — Telehealth: Payer: Self-pay | Admitting: Internal Medicine

## 2012-07-27 NOTE — Telephone Encounter (Signed)
Spoke with patients dad Harvie Heck) and he stated that his daughter has been having nausea spells on and off for over a year now.  It usually happens in the morning when she first wakes up.  She has dry heaves, vomiting at times, and nausea.  Does she need an appt or can we call in something for nausea?  Please advise.

## 2012-07-27 NOTE — Telephone Encounter (Signed)
Needs appointment to discuss.

## 2012-07-27 NOTE — Telephone Encounter (Signed)
Patient's mom advised as instructed via telephone, appt scheduled for Thursday at 10:30 with Dr. Dan Humphreys.

## 2012-07-27 NOTE — Telephone Encounter (Signed)
Patient's father wants [name] to have an appointment for Thursday or call her something in for nausea.

## 2012-07-29 ENCOUNTER — Encounter: Payer: Self-pay | Admitting: Internal Medicine

## 2012-07-29 ENCOUNTER — Ambulatory Visit (INDEPENDENT_AMBULATORY_CARE_PROVIDER_SITE_OTHER): Payer: Medicare Other | Admitting: Internal Medicine

## 2012-07-29 VITALS — BP 120/80 | HR 100 | Temp 98.7°F | Ht 61.5 in | Wt 198.5 lb

## 2012-07-29 DIAGNOSIS — R110 Nausea: Secondary | ICD-10-CM | POA: Diagnosis not present

## 2012-07-29 DIAGNOSIS — M7750 Other enthesopathy of unspecified foot: Secondary | ICD-10-CM | POA: Diagnosis not present

## 2012-07-29 DIAGNOSIS — L600 Ingrowing nail: Secondary | ICD-10-CM | POA: Diagnosis not present

## 2012-07-29 DIAGNOSIS — L0100 Impetigo, unspecified: Secondary | ICD-10-CM | POA: Diagnosis not present

## 2012-07-29 MED ORDER — GENTAMICIN SULFATE 0.1 % EX OINT: TOPICAL_OINTMENT | Freq: Three times a day (TID) | CUTANEOUS | Status: DC

## 2012-07-29 MED ORDER — ONDANSETRON 8 MG PO TBDP: 8.0000 mg | ORAL_TABLET | Freq: Three times a day (TID) | ORAL | Status: DC | PRN

## 2012-07-29 MED ORDER — ONDANSETRON 8 MG PO TBDP: 8.0000 mg | ORAL_TABLET | Freq: Three times a day (TID) | ORAL | Status: AC | PRN

## 2012-07-29 NOTE — Assessment & Plan Note (Signed)
Patient with nausea associated with menstrual cycle. Nausea may also be exacerbated by current medications. Patient has had a recent CT abdomen and GI evaluation which has been unremarkable. Will start Zofran for use as needed for nausea. Patient will call if symptoms are persistent.

## 2012-07-29 NOTE — Assessment & Plan Note (Signed)
Rash over her chin and nose are consistent with impetigo. Will treat with topical gentamicin. Patient will call if symptoms are persistent.

## 2012-07-29 NOTE — Progress Notes (Signed)
Subjective:    Patient ID: Jamie Ross, female    DOB: 1986/01/03, 26 y.o.   MRN: [id number]  HPI  26 year old female with history of cognitive impairment and ongoing behavioral issues presents for acute visit complaining of nausea. Her parents report that around the time of her menstrual cycle she often has nausea and vomiting in the mornings. The emesis consist of clear liquid or occasionally food products. She denies any persistent abdominal pain. She occasionally does complain of crampy pain just before her menses. Parents deny any ongoing issues with constipation or diarrhea. They note that some medications, including her Carafate seem to exacerbate symptoms of nausea. In the past, she had use Zofran for this with significant improvement.  Parents are also concerned about rash over her chin and nose. Should they report that the symptoms first began with use of CPAP. The mass seems to scratch her chin and nose leading to blisterlike lesions. He has not been using any medication for this.  Outpatient Encounter Prescriptions as of 07/29/2012  Medication Sig Dispense Refill  . albuterol (PROVENTIL HFA;VENTOLIN HFA) 108 (90 BASE) MCG/ACT inhaler Inhale 2 puffs into the lungs every 6 (six) hours as needed.      Marland Kitchen albuterol (PROVENTIL) (2.5 MG/3ML) 0.083% nebulizer solution Take 2.5 mg by nebulization every 6 (six) hours as needed.      . chlorproMAZINE (THORAZINE) 25 MG tablet Take by mouth as directed. 2 tab in AM and 4 tabs at bedtime      . clonazePAM (KLONOPIN) 0.5 MG tablet Take 0.5 mg by mouth 2 (two) times daily.      . diclofenac (FLECTOR) 1.3 % PTCH Place 1 patch onto the skin as directed.      . diphenhydrAMINE (BENADRYL) 25 MG tablet Take 1 tablet (25 mg total) by mouth at bedtime as needed for allergies.  30 tablet  0  . drospirenone-ethinyl estradiol (LORYNA) 3-0.02 MG tablet Take 1 tablet by mouth daily.      . DULoxetine (CYMBALTA) 60 MG capsule Take 60 mg by mouth daily.        . furosemide (LASIX) 20 MG tablet Take 1 tablet (20 mg total) by mouth daily.  30 tablet  3  . gabapentin (NEURONTIN) 300 MG capsule TAKE ONE CAPSULE BY MOUTH THREE TIMES DAILY  90 capsule  0  . HYDROcodone-acetaminophen (NORCO) 7.5-325 MG per tablet Take 0.5-1 tablets by mouth 2 (two) times daily as needed.  60 tablet  0  . loratadine (CLARITIN) 10 MG tablet Take 1 tablet (10 mg total) by mouth daily.  30 tablet  11  . meloxicam (MOBIC) 15 MG tablet Take 1 tablet (15 mg total) by mouth daily.  30 tablet  0  . pantoprazole (PROTONIX) 40 MG tablet Take 40 mg by mouth 2 (two) times daily.      . polyethylene glycol (MIRALAX / GLYCOLAX) packet Take 17 g by mouth 2 (two) times daily.      . prazosin (MINIPRESS) 2 MG capsule Take 3 mg by mouth at bedtime.       . topiramate (TOPAMAX) 25 MG capsule Take 2 capsules (50 mg total) by mouth 2 (two) times daily.  120 capsule  3  . traMADol (ULTRAM) 50 MG tablet Take 50 mg by mouth every 6 (six) hours as needed.      Marland Kitchen gentamicin ointment (GARAMYCIN) 0.1 % Apply topically 3 (three) times daily.  15 g  0  . ondansetron (ZOFRAN-ODT) 8 MG disintegrating tablet Take  1 tablet (8 mg total) by mouth every 8 (eight) hours as needed for nausea.  90 tablet  3  . DISCONTD: ondansetron (ZOFRAN-ODT) 8 MG disintegrating tablet Take 1 tablet (8 mg total) by mouth every 8 (eight) hours as needed for nausea.  20 tablet  0   BP 120/80  Pulse 100  Temp 98.7 F (37.1 C) (Oral)  Ht 5' 1.5" (1.562 m)  Wt 198 lb 8 oz (90.039 kg)  BMI 36.90 kg/m2  SpO2 94%  LMP 07/02/2012  Review of Systems  Constitutional: Positive for fatigue. Negative for fever, chills, appetite change and unexpected weight change.  HENT: Negative for ear pain, congestion, sore throat, trouble swallowing, neck pain, voice change and sinus pressure.   Eyes: Negative for visual disturbance.  Respiratory: Negative for cough, shortness of breath, wheezing and stridor.   Cardiovascular: Negative for chest  pain, palpitations and leg swelling.  Gastrointestinal: Positive for nausea and vomiting. Negative for abdominal pain, diarrhea, constipation, blood in stool, abdominal distention and anal bleeding.  Genitourinary: Negative for dysuria and flank pain.  Musculoskeletal: Negative for myalgias, arthralgias and gait problem.  Skin: Positive for rash. Negative for color change.  Neurological: Negative for dizziness and headaches.  Hematological: Negative for adenopathy. Does not bruise/bleed easily.  Psychiatric/Behavioral: Positive for behavioral problems. Negative for suicidal ideas, disturbed wake/sleep cycle and dysphoric mood. The patient is not nervous/anxious.        Objective:   Physical Exam  Constitutional: She is oriented to person, place, and time. She appears well-developed and well-nourished. No distress.  HENT:  Head: Normocephalic and atraumatic.  Right Ear: External ear normal.  Left Ear: External ear normal.  Nose: Nose normal.  Mouth/Throat: Oropharynx is clear and moist. No oropharyngeal exudate.  Eyes: Conjunctivae are normal. Pupils are equal, round, and reactive to light. Right eye exhibits no discharge. Left eye exhibits no discharge. No scleral icterus.  Neck: Normal range of motion. Neck supple. No tracheal deviation present. No thyromegaly present.  Cardiovascular: Normal rate, regular rhythm, normal heart sounds and intact distal pulses.  Exam reveals no gallop and no friction rub.   No murmur heard. Pulmonary/Chest: Effort normal and breath sounds normal. No respiratory distress. She has no wheezes. She has no rales. She exhibits no tenderness.  Abdominal: Soft. Bowel sounds are normal. She exhibits no distension and no mass. There is no tenderness. There is no guarding.  Musculoskeletal: Normal range of motion. She exhibits no edema and no tenderness.  Lymphadenopathy:    She has no cervical adenopathy.  Neurological: She is alert and oriented to person, place,  and time. No cranial nerve deficit. She exhibits normal muscle tone. Coordination normal.  Skin: Skin is warm and dry. Rash noted. Rash is pustular (over chin and bridge of nose). She is not diaphoretic. No erythema. No pallor.  Psychiatric: She has a normal mood and affect. Judgment and thought content normal. Her speech is slurred. She is slowed.          Assessment & Plan:

## 2012-08-07 ENCOUNTER — Other Ambulatory Visit: Payer: Self-pay | Admitting: Internal Medicine

## 2012-08-13 DIAGNOSIS — L600 Ingrowing nail: Secondary | ICD-10-CM | POA: Diagnosis not present

## 2012-08-13 DIAGNOSIS — L03039 Cellulitis of unspecified toe: Secondary | ICD-10-CM | POA: Diagnosis not present

## 2012-08-14 ENCOUNTER — Other Ambulatory Visit: Payer: Self-pay | Admitting: Internal Medicine

## 2012-08-24 DIAGNOSIS — E669 Obesity, unspecified: Secondary | ICD-10-CM | POA: Diagnosis not present

## 2012-08-24 DIAGNOSIS — N915 Oligomenorrhea, unspecified: Secondary | ICD-10-CM | POA: Diagnosis not present

## 2012-08-24 DIAGNOSIS — E229 Hyperfunction of pituitary gland, unspecified: Secondary | ICD-10-CM | POA: Diagnosis not present

## 2012-08-31 DIAGNOSIS — J019 Acute sinusitis, unspecified: Secondary | ICD-10-CM | POA: Diagnosis not present

## 2012-08-31 DIAGNOSIS — H109 Unspecified conjunctivitis: Secondary | ICD-10-CM | POA: Diagnosis not present

## 2012-08-31 DIAGNOSIS — R05 Cough: Secondary | ICD-10-CM | POA: Diagnosis not present

## 2012-08-31 DIAGNOSIS — R51 Headache: Secondary | ICD-10-CM | POA: Diagnosis not present

## 2012-08-31 DIAGNOSIS — R059 Cough, unspecified: Secondary | ICD-10-CM | POA: Diagnosis not present

## 2012-09-03 ENCOUNTER — Ambulatory Visit: Payer: Medicare Other | Admitting: Internal Medicine

## 2012-09-09 DIAGNOSIS — Z79899 Other long term (current) drug therapy: Secondary | ICD-10-CM | POA: Diagnosis not present

## 2012-09-09 DIAGNOSIS — F4310 Post-traumatic stress disorder, unspecified: Secondary | ICD-10-CM | POA: Diagnosis not present

## 2012-09-09 DIAGNOSIS — F330 Major depressive disorder, recurrent, mild: Secondary | ICD-10-CM | POA: Diagnosis not present

## 2012-09-09 DIAGNOSIS — Z1339 Encounter for screening examination for other mental health and behavioral disorders: Secondary | ICD-10-CM | POA: Diagnosis not present

## 2012-09-09 DIAGNOSIS — F70 Mild intellectual disabilities: Secondary | ICD-10-CM | POA: Diagnosis not present

## 2012-09-10 ENCOUNTER — Ambulatory Visit (INDEPENDENT_AMBULATORY_CARE_PROVIDER_SITE_OTHER): Payer: Medicare Other | Admitting: Internal Medicine

## 2012-09-10 ENCOUNTER — Encounter: Payer: Self-pay | Admitting: Internal Medicine

## 2012-09-10 VITALS — BP 130/82 | HR 91 | Temp 97.7°F | Ht 61.5 in | Wt 199.5 lb

## 2012-09-10 DIAGNOSIS — H6690 Otitis media, unspecified, unspecified ear: Secondary | ICD-10-CM | POA: Diagnosis not present

## 2012-09-10 DIAGNOSIS — B373 Candidiasis of vulva and vagina: Secondary | ICD-10-CM | POA: Diagnosis not present

## 2012-09-10 DIAGNOSIS — L600 Ingrowing nail: Secondary | ICD-10-CM | POA: Diagnosis not present

## 2012-09-10 DIAGNOSIS — H6691 Otitis media, unspecified, right ear: Secondary | ICD-10-CM

## 2012-09-10 DIAGNOSIS — B3731 Acute candidiasis of vulva and vagina: Secondary | ICD-10-CM | POA: Diagnosis not present

## 2012-09-10 MED ORDER — AZITHROMYCIN 250 MG PO TABS: ORAL_TABLET | ORAL | Status: DC

## 2012-09-10 MED ORDER — FLUCONAZOLE 150 MG PO TABS: 150.0000 mg | ORAL_TABLET | Freq: Every day | ORAL | Status: DC

## 2012-09-10 NOTE — Assessment & Plan Note (Signed)
Symptoms and exam are consistent with vaginal candidiasis. Will treat with diflucan. Follow up 1 month and prn.

## 2012-09-10 NOTE — Assessment & Plan Note (Signed)
Symptoms and exam consistent with right otitis media. Will treat with augmentin. Pt will RTC if symptoms are persistent or if fever, worsening pain. Otherwise, follow up 1 month for recheck.

## 2012-09-10 NOTE — Progress Notes (Signed)
Subjective:    Patient ID: Jamie Ross, female    DOB: [birth year], 26 y.o.   MRN: [id number]  HPI 26 YO female with developmental delay presents for follow up. Complains of right>left ear pain, clear nasal congestion and cough over last couple of weeks. Denies fever, chills, dyspnea, chest pain.  Has some chest tightness. Has been using inhaled albuterol with no improvement.  Also notes several weeks of vaginal itching. Denies vaginal discharge, fever, pelvic pain.  Outpatient Encounter Prescriptions as of 09/10/2012  Medication Sig Dispense Refill  . albuterol (PROVENTIL HFA;VENTOLIN HFA) 108 (90 BASE) MCG/ACT inhaler Inhale 2 puffs into the lungs every 6 (six) hours as needed.      Marland Kitchen albuterol (PROVENTIL) (2.5 MG/3ML) 0.083% nebulizer solution Take 2.5 mg by nebulization every 6 (six) hours as needed.      . chlorproMAZINE (THORAZINE) 25 MG tablet Take by mouth as directed. 2 tab in AM and 4 tabs at bedtime      . clonazePAM (KLONOPIN) 0.5 MG tablet Take 0.5 mg by mouth 2 (two) times daily.      . diclofenac (FLECTOR) 1.3 % PTCH Place 1 patch onto the skin as directed.      . diphenhydrAMINE (BENADRYL) 25 MG tablet Take 1 tablet (25 mg total) by mouth at bedtime as needed for allergies.  30 tablet  0  . drospirenone-ethinyl estradiol (LORYNA) 3-0.02 MG tablet Take 1 tablet by mouth daily.      . DULoxetine (CYMBALTA) 60 MG capsule Take 60 mg by mouth daily.      . furosemide (LASIX) 20 MG tablet TAKE ONE TABLET BY MOUTH EVERY DAY  30 tablet  3  . gabapentin (NEURONTIN) 300 MG capsule TAKE ONE CAPSULE BY MOUTH THREE TIMES DAILY  90 capsule  5  . gentamicin ointment (GARAMYCIN) 0.1 % Apply topically 3 (three) times daily.  15 g  0  . HYDROcodone-acetaminophen (NORCO) 7.5-325 MG per tablet Take 0.5-1 tablets by mouth 2 (two) times daily as needed.  60 tablet  0  . loratadine (CLARITIN) 10 MG tablet Take 1 tablet (10 mg total) by mouth daily.  30 tablet  11  . meloxicam (MOBIC) 15 MG  tablet Take 1 tablet (15 mg total) by mouth daily.  30 tablet  0  . pantoprazole (PROTONIX) 40 MG tablet Take 40 mg by mouth 2 (two) times daily.      . polyethylene glycol (MIRALAX / GLYCOLAX) packet Take 17 g by mouth 2 (two) times daily.      . prazosin (MINIPRESS) 2 MG capsule Take 3 mg by mouth at bedtime.       . topiramate (TOPAMAX) 25 MG capsule Take 2 capsules (50 mg total) by mouth 2 (two) times daily.  120 capsule  3  . traMADol (ULTRAM) 50 MG tablet Take 50 mg by mouth every 6 (six) hours as needed.      Marland Kitchen azithromycin (ZITHROMAX Z-PAK) 250 MG tablet Take 2 pills day 1, then 1 pill daily days 2-5  6 each  0  . fluconazole (DIFLUCAN) 150 MG tablet Take 1 tablet (150 mg total) by mouth daily.  3 tablet  0   BP 130/82  Pulse 91  Temp 97.7 F (36.5 C) (Oral)  Ht 5' 1.5" (1.562 m)  Wt 199 lb 8 oz (90.493 kg)  BMI 37.08 kg/m2  SpO2 97%  LMP 07/30/2012  Review of Systems  Constitutional: Negative for fever, chills, appetite change, fatigue and unexpected weight change.  HENT: Positive for ear pain, congestion, rhinorrhea and sneezing. Negative for sore throat, trouble swallowing, neck pain, voice change and sinus pressure.   Eyes: Negative for visual disturbance.  Respiratory: Positive for cough. Negative for shortness of breath, wheezing and stridor.   Cardiovascular: Negative for chest pain, palpitations and leg swelling.  Gastrointestinal: Negative for nausea, vomiting, abdominal pain, diarrhea, constipation, blood in stool, abdominal distention and anal bleeding.  Genitourinary: Negative for dysuria, flank pain, vaginal bleeding, vaginal discharge, vaginal pain and pelvic pain.  Musculoskeletal: Negative for myalgias, arthralgias and gait problem.  Skin: Negative for color change and rash.  Neurological: Negative for dizziness and headaches.  Hematological: Negative for adenopathy. Does not bruise/bleed easily.  Psychiatric/Behavioral: Negative for suicidal ideas, disturbed  wake/sleep cycle and dysphoric mood. The patient is not nervous/anxious.        Objective:   Physical Exam  Constitutional: She is oriented to person, place, and time. She appears well-developed and well-nourished. No distress.  HENT:  Head: Normocephalic and atraumatic.  Right Ear: External ear normal. Tympanic membrane is erythematous and bulging. A middle ear effusion is present.  Left Ear: External ear normal. Tympanic membrane is not erythematous and not bulging.  No middle ear effusion.  Nose: Nose normal.  Mouth/Throat: Oropharynx is clear and moist. No oropharyngeal exudate.  Eyes: Conjunctivae normal are normal. Pupils are equal, round, and reactive to light. Right eye exhibits no discharge. Left eye exhibits no discharge. No scleral icterus.  Neck: Normal range of motion. Neck supple. No tracheal deviation present. No thyromegaly present.  Cardiovascular: Normal rate, regular rhythm, normal heart sounds and intact distal pulses.  Exam reveals no gallop and no friction rub.   No murmur heard. Pulmonary/Chest: Effort normal and breath sounds normal. No respiratory distress. She has no wheezes. She has no rales. She exhibits no tenderness.  Genitourinary: There is rash on the right labia. There is rash on the left labia. There is erythema (erythematous rash c/w yeast) around the vagina.  Musculoskeletal: Normal range of motion. She exhibits no edema and no tenderness.  Lymphadenopathy:    She has no cervical adenopathy.  Neurological: She is alert and oriented to person, place, and time. No cranial nerve deficit. She exhibits normal muscle tone. Coordination normal.  Skin: Skin is warm and dry. No rash noted. She is not diaphoretic. No erythema. No pallor.  Psychiatric: She has a normal mood and affect. Her behavior is normal. Judgment and thought content normal.          Assessment & Plan:

## 2012-09-28 ENCOUNTER — Telehealth: Payer: Self-pay | Admitting: Internal Medicine

## 2012-09-28 NOTE — Telephone Encounter (Signed)
Pt's father is calling upset because they received a bill. Pt has Medicaid and Medicare she is disabled. Pt's father called Medicaid social worker and Medicaid office in [place]. They told him he should have never received the bill and then he called [facility] billing and they told he it was filed wrong and they were supposed to get a bill. He is upset because they both were giving him the run around and wouldn't help him he says. He wasn't sure what to do ???

## 2012-09-29 ENCOUNTER — Other Ambulatory Visit: Payer: Self-pay | Admitting: *Deleted

## 2012-09-29 MED ORDER — PANTOPRAZOLE SODIUM 40 MG PO TBEC: 40.0000 mg | DELAYED_RELEASE_TABLET | Freq: Two times a day (BID) | ORAL | Status: DC

## 2012-09-30 NOTE — Telephone Encounter (Signed)
I called and spoke with Jamie Ross, I advised him that I couldn't see why they were being billed and asked that I contact Annabelle and see if she could help me figure this out.  He said that would be fine, and I advised him that I would contact him back once I heard back from her.

## 2012-10-05 ENCOUNTER — Other Ambulatory Visit: Payer: Self-pay | Admitting: Internal Medicine

## 2012-10-05 NOTE — Telephone Encounter (Signed)
Refill request for chlorpromaz 200 mg tab Qty: 30 Sig: take one tablet by mouth at bedtime

## 2012-10-06 MED ORDER — DULOXETINE HCL 60 MG PO CPEP: 60.0000 mg | ORAL_CAPSULE | Freq: Every day | ORAL | Status: DC

## 2012-10-06 MED ORDER — CHLORPROMAZINE HCL 25 MG PO TABS: 25.0000 mg | ORAL_TABLET | ORAL | Status: DC

## 2012-10-06 NOTE — Telephone Encounter (Signed)
Refill request for cymbalta 60 mg cap Qty: 30 Sig: take one capsule by mouth every day

## 2012-10-14 ENCOUNTER — Telehealth: Payer: Self-pay | Admitting: Internal Medicine

## 2012-10-14 NOTE — Telephone Encounter (Signed)
Will need to go to urgent care

## 2012-10-14 NOTE — Telephone Encounter (Signed)
Pt's son is saying that Pt has burning when she urinates and believes she has a UTI. There are no appointments until next Wednesday and they aren't sure what to do. She uses [facility] [address]

## 2012-10-14 NOTE — Telephone Encounter (Signed)
Patients father advised via telephone.

## 2012-10-15 DIAGNOSIS — R300 Dysuria: Secondary | ICD-10-CM | POA: Diagnosis not present

## 2012-10-15 DIAGNOSIS — R109 Unspecified abdominal pain: Secondary | ICD-10-CM | POA: Diagnosis not present

## 2012-10-15 DIAGNOSIS — IMO0001 Reserved for inherently not codable concepts without codable children: Secondary | ICD-10-CM | POA: Diagnosis not present

## 2012-10-15 DIAGNOSIS — R809 Proteinuria, unspecified: Secondary | ICD-10-CM | POA: Diagnosis not present

## 2012-10-15 DIAGNOSIS — M545 Low back pain, unspecified: Secondary | ICD-10-CM | POA: Diagnosis not present

## 2012-10-18 ENCOUNTER — Ambulatory Visit: Payer: Medicare Other | Admitting: Internal Medicine

## 2012-10-22 ENCOUNTER — Encounter: Payer: Self-pay | Admitting: Internal Medicine

## 2012-10-22 ENCOUNTER — Ambulatory Visit (INDEPENDENT_AMBULATORY_CARE_PROVIDER_SITE_OTHER): Payer: Medicare Other | Admitting: Internal Medicine

## 2012-10-22 VITALS — BP 122/80 | HR 106 | Temp 97.9°F | Ht 61.5 in | Wt 201.5 lb

## 2012-10-22 DIAGNOSIS — G589 Mononeuropathy, unspecified: Secondary | ICD-10-CM | POA: Diagnosis not present

## 2012-10-22 DIAGNOSIS — R109 Unspecified abdominal pain: Secondary | ICD-10-CM | POA: Diagnosis not present

## 2012-10-22 DIAGNOSIS — N390 Urinary tract infection, site not specified: Secondary | ICD-10-CM | POA: Diagnosis not present

## 2012-10-22 DIAGNOSIS — G629 Polyneuropathy, unspecified: Secondary | ICD-10-CM

## 2012-10-22 LAB — POCT URINALYSIS DIPSTICK
Bilirubin, UA: NEGATIVE
Ketones, UA: NEGATIVE
Leukocytes, UA: NEGATIVE
Spec Grav, UA: 1.01
pH, UA: 6

## 2012-10-22 MED ORDER — HYDROCODONE-ACETAMINOPHEN 7.5-325 MG PO TABS: 0.5000 | ORAL_TABLET | Freq: Two times a day (BID) | ORAL | Status: DC | PRN

## 2012-10-22 MED ORDER — CIPROFLOXACIN HCL 500 MG PO TABS: 500.0000 mg | ORAL_TABLET | Freq: Two times a day (BID) | ORAL | Status: DC

## 2012-10-22 MED ORDER — GABAPENTIN 300 MG PO CAPS: 300.0000 mg | ORAL_CAPSULE | Freq: Four times a day (QID) | ORAL | Status: DC

## 2012-10-22 NOTE — Progress Notes (Signed)
Subjective:    Patient ID: Jamie Ross, female    DOB: 1986-01-25, 26 y.o.   MRN: [id number]  HPI 26 year old female with history of developmental delay presents for acute visit complaining of several days of urinary frequency, urgency, dysuria, flank and suprapubic pain, and hematuria. She denies fever or chills. She was seen at urgent care and started on Septra. She has taken this medication for 2 days and has had nausea and vomiting with the medication. She reports urinary symptoms have been unchanged.  She is also concerned about worsening neuropathic pain. She describes aching and burning in her legs and arms which limits her ability to sleep. Her parents have been giving her Neurontin 300 mg 3 times daily however she often wakes during the night complaining of pain. He occasionally uses hydrocodone for breakthrough pain with some improvement.  Outpatient Prescriptions Prior to Visit  Medication Sig Dispense Refill  . albuterol (PROVENTIL HFA;VENTOLIN HFA) 108 (90 BASE) MCG/ACT inhaler Inhale 2 puffs into the lungs every 6 (six) hours as needed.      Marland Kitchen albuterol (PROVENTIL) (2.5 MG/3ML) 0.083% nebulizer solution Take 2.5 mg by nebulization every 6 (six) hours as needed.      . chlorproMAZINE (THORAZINE) 25 MG tablet Take 1 tablet (25 mg total) by mouth as directed. 2 tab in AM and 4 tabs at bedtime  180 tablet  3  . clonazePAM (KLONOPIN) 0.5 MG tablet Take 0.5 mg by mouth 2 (two) times daily.      . diclofenac (FLECTOR) 1.3 % PTCH Place 1 patch onto the skin as directed.      . diphenhydrAMINE (BENADRYL) 25 MG tablet Take 1 tablet (25 mg total) by mouth at bedtime as needed for allergies.  30 tablet  0  . drospirenone-ethinyl estradiol (LORYNA) 3-0.02 MG tablet Take 1 tablet by mouth daily.      . DULoxetine (CYMBALTA) 60 MG capsule Take 1 capsule (60 mg total) by mouth daily.  30 capsule  6  . furosemide (LASIX) 20 MG tablet TAKE ONE TABLET BY MOUTH EVERY DAY  30 tablet  3  .  gentamicin ointment (GARAMYCIN) 0.1 % Apply topically 3 (three) times daily.  15 g  0  . loratadine (CLARITIN) 10 MG tablet Take 1 tablet (10 mg total) by mouth daily.  30 tablet  11  . meloxicam (MOBIC) 15 MG tablet Take 1 tablet (15 mg total) by mouth daily.  30 tablet  0  . pantoprazole (PROTONIX) 40 MG tablet Take 1 tablet (40 mg total) by mouth 2 (two) times daily.  180 tablet  1  . polyethylene glycol (MIRALAX / GLYCOLAX) packet Take 17 g by mouth 2 (two) times daily.      . prazosin (MINIPRESS) 2 MG capsule Take 3 mg by mouth at bedtime.       . topiramate (TOPAMAX) 25 MG capsule Take 2 capsules (50 mg total) by mouth 2 (two) times daily.  120 capsule  3  . traMADol (ULTRAM) 50 MG tablet Take 50 mg by mouth every 6 (six) hours as needed.      . gabapentin (NEURONTIN) 300 MG capsule TAKE ONE CAPSULE BY MOUTH THREE TIMES DAILY  90 capsule  5  . HYDROcodone-acetaminophen (NORCO) 7.5-325 MG per tablet Take 0.5-1 tablets by mouth 2 (two) times daily as needed.  60 tablet  0  . azithromycin (ZITHROMAX Z-PAK) 250 MG tablet Take 2 pills day 1, then 1 pill daily days 2-5  6 each  0  .  fluconazole (DIFLUCAN) 150 MG tablet Take 1 tablet (150 mg total) by mouth daily.  3 tablet  0   BP 122/80  Pulse 106  Temp 97.9 F (36.6 C) (Oral)  Ht 5' 1.5" (1.562 m)  Wt 201 lb 8 oz (91.4 kg)  BMI 37.46 kg/m2  SpO2 97%  LMP 10/08/2012   Review of Systems  Constitutional: Negative for fever, chills, appetite change, fatigue and unexpected weight change.  HENT: Negative for ear pain, congestion, sore throat, trouble swallowing, neck pain, voice change and sinus pressure.   Eyes: Negative for visual disturbance.  Respiratory: Negative for cough, shortness of breath, wheezing and stridor.   Cardiovascular: Negative for chest pain, palpitations and leg swelling.  Gastrointestinal: Negative for nausea, vomiting, abdominal pain, diarrhea, constipation, blood in stool, abdominal distention and anal bleeding.    Genitourinary: Positive for dysuria, urgency, frequency, hematuria and flank pain.  Musculoskeletal: Positive for myalgias. Negative for arthralgias and gait problem.  Skin: Negative for color change and rash.  Neurological: Negative for dizziness, weakness, numbness and headaches.  Hematological: Negative for adenopathy. Does not bruise/bleed easily.  Psychiatric/Behavioral: Negative for suicidal ideas, sleep disturbance and dysphoric mood. The patient is not nervous/anxious.        Objective:   Physical Exam  Constitutional: She is oriented to person, place, and time. She appears well-developed and well-nourished. No distress.  HENT:  Head: Normocephalic and atraumatic.  Right Ear: External ear normal.  Left Ear: External ear normal.  Nose: Nose normal.  Mouth/Throat: Oropharynx is clear and moist. No oropharyngeal exudate.  Eyes: Conjunctivae normal are normal. Pupils are equal, round, and reactive to light. Right eye exhibits no discharge. Left eye exhibits no discharge. No scleral icterus.  Neck: Normal range of motion. Neck supple. No tracheal deviation present. No thyromegaly present.  Cardiovascular: Normal rate, regular rhythm, normal heart sounds and intact distal pulses.  Exam reveals no gallop and no friction rub.   No murmur heard. Pulmonary/Chest: Effort normal and breath sounds normal. No respiratory distress. She has no wheezes. She has no rales. She exhibits no tenderness.  Abdominal: Soft. Bowel sounds are normal. She exhibits no distension. There is tenderness (suprapubic and bilateral flank).  Musculoskeletal: Normal range of motion. She exhibits no edema and no tenderness.  Lymphadenopathy:    She has no cervical adenopathy.  Neurological: She is alert and oriented to person, place, and time. No cranial nerve deficit. She exhibits normal muscle tone. Coordination normal.  Skin: Skin is warm and dry. No rash noted. She is not diaphoretic. No erythema. No pallor.   Psychiatric: She has a normal mood and affect. Her behavior is normal. Judgment and thought content normal.          Assessment & Plan:

## 2012-10-22 NOTE — Assessment & Plan Note (Signed)
Symptoms consistent with UTI. Pt had possible allergy to Sulfa. Will change to Cipro. Will send urine for culture. Parents will monitor pt for worsening symptoms, fever. They will call or go to urgent care or ED over weekend if any concerns. They will continue increased fluid intake and Azo for pain.

## 2012-10-22 NOTE — Assessment & Plan Note (Signed)
Symptoms recently worsening. Will try increasing dose of Neurontin to 300mg  qam and qnoon, then 600mg  at bedtime. Parents will watch for oversedation with increased dose. They will call if any problems with the medication. Follow up in 4 weeks or sooner as needed.

## 2012-10-24 LAB — URINE CULTURE: Colony Count: 10000

## 2012-11-01 ENCOUNTER — Telehealth: Payer: Self-pay | Admitting: General Practice

## 2012-11-01 NOTE — Telephone Encounter (Signed)
Pt dad notified

## 2012-11-01 NOTE — Telephone Encounter (Signed)
Pt father called stating that pt is all congested in the chest and nose. Also has unproductive cough and body aches pain scale of 10. Symptoms have been there for 2 days. Father also advised that Pt is taking OTC Benadryl, and has finished CIPRO from UTI. Please advise.

## 2012-11-01 NOTE — Telephone Encounter (Signed)
Symptoms likely from viral infection. Does pt have fever?  If yes, then should be swabbed for flu. If no, then would continue supportive care, OTC tylenol for myalgia, claritin for congestion. Follow up if symptoms not improving by Friday.

## 2012-11-03 ENCOUNTER — Telehealth: Payer: Self-pay | Admitting: Internal Medicine

## 2012-11-03 NOTE — Telephone Encounter (Signed)
Appt made for 11/29

## 2012-11-03 NOTE — Telephone Encounter (Signed)
We can work in a slot on Friday if that is acceptable to them. Otherwise, will need to go to urgent care.

## 2012-11-03 NOTE — Telephone Encounter (Signed)
Patient Information:  Caller Name: Harvie Heck  Phone: [phone]  Patient: Jamie, Ross  Gender: Female  DOB: 11-Jul-1986  Age: 26 Years  PCP: Ronna Polio (Adults only)  Pregnant: No   Symptoms  Reason For Call & Symptoms: muscle aches, sinus pain; afebrile  Reviewed Health History In EMR: Yes  Reviewed Medications In EMR: Yes  Reviewed Allergies In EMR: Yes  Date of Onset of Symptoms: 11/01/2012 OB:  LMP: Unknown  Guideline(s) Used:  Sinus Pain and Congestion  Disposition Per Guideline:   See Today or Tomorrow in Office  Reason For Disposition Reached:   Using nasal washes and pain medicine > 24 hours and sinus pain (lower forehead, cheekbone, or eye) persists  Advice Given:  N/A  Office Follow Up:  Does the office need to follow up with this patient?: Yes  Instructions For The Office: appt need; none available in epic  RN Note:  Appts unavailable in Epic; info to office for staff management of appt need.  krs/can

## 2012-11-05 ENCOUNTER — Encounter: Payer: Self-pay | Admitting: Internal Medicine

## 2012-11-05 ENCOUNTER — Ambulatory Visit (INDEPENDENT_AMBULATORY_CARE_PROVIDER_SITE_OTHER): Payer: Medicare Other | Admitting: Internal Medicine

## 2012-11-05 ENCOUNTER — Ambulatory Visit (INDEPENDENT_AMBULATORY_CARE_PROVIDER_SITE_OTHER)
Admission: RE | Admit: 2012-11-05 | Discharge: 2012-11-05 | Disposition: A | Discharge status: Home / Self Care | Payer: Medicare Other | Source: Ambulatory Visit | Attending: Internal Medicine | Admitting: Internal Medicine

## 2012-11-05 VITALS — BP 122/78 | HR 98 | Temp 98.3°F | Resp 16 | Wt 201.5 lb

## 2012-11-05 DIAGNOSIS — N926 Irregular menstruation, unspecified: Secondary | ICD-10-CM | POA: Diagnosis not present

## 2012-11-05 DIAGNOSIS — N390 Urinary tract infection, site not specified: Secondary | ICD-10-CM | POA: Diagnosis not present

## 2012-11-05 DIAGNOSIS — R05 Cough: Secondary | ICD-10-CM | POA: Diagnosis not present

## 2012-11-05 DIAGNOSIS — K219 Gastro-esophageal reflux disease without esophagitis: Secondary | ICD-10-CM | POA: Diagnosis not present

## 2012-11-05 DIAGNOSIS — N912 Amenorrhea, unspecified: Secondary | ICD-10-CM | POA: Diagnosis not present

## 2012-11-05 DIAGNOSIS — G589 Mononeuropathy, unspecified: Secondary | ICD-10-CM | POA: Diagnosis not present

## 2012-11-05 DIAGNOSIS — R059 Cough, unspecified: Secondary | ICD-10-CM | POA: Diagnosis not present

## 2012-11-05 DIAGNOSIS — G629 Polyneuropathy, unspecified: Secondary | ICD-10-CM

## 2012-11-05 LAB — COMPREHENSIVE METABOLIC PANEL
ALT: 15 U/L (ref 0–35)
AST: 19 U/L (ref 0–37)
Albumin: 3.9 g/dL (ref 3.5–5.2)
CO2: 24 mEq/L (ref 19–32)
Calcium: 8.9 mg/dL (ref 8.4–10.5)
Chloride: 105 mEq/L (ref 96–112)
GFR: 75.32 mL/min (ref >60.00)
Potassium: 3.5 mEq/L (ref 3.5–5.1)
Total Protein: 7.4 g/dL (ref 6.0–8.3)

## 2012-11-05 LAB — CBC WITH DIFFERENTIAL/PLATELET
Basophils Absolute: 0 10*3/uL (ref 0.0–0.1)
Eosinophils Absolute: 0.1 10*3/uL (ref 0.0–0.7)
Lymphocytes Relative: 23.9 % (ref 12.0–46.0)
MCHC: 32.6 g/dL (ref 30.0–36.0)
Neutro Abs: 6.6 10*3/uL (ref 1.4–7.7)
Neutrophils Relative %: 69.1 % (ref 43.0–77.0)
Platelets: 292 10*3/uL (ref 150.0–400.0)
RDW: 14.4 % (ref 11.5–14.6)

## 2012-11-05 LAB — POCT URINALYSIS DIPSTICK
Glucose, UA: NEGATIVE
Leukocytes, UA: NEGATIVE
Nitrite, UA: NEGATIVE
Urobilinogen, UA: 0.2

## 2012-11-05 MED ORDER — DIPHENHYDRAMINE HCL 25 MG PO TABS: 25.0000 mg | ORAL_TABLET | Freq: Every evening | ORAL | Status: DC | PRN

## 2012-11-05 NOTE — Progress Notes (Signed)
Subjective:    Patient ID: Jamie Ross, female    DOB: Dec 22, 1985, 26 y.o.   MRN: [id number]  HPI 26 YO female with history of developmental delay and chronic pain presents for acute visit. She is complaining today of irregular menses, epigastric abdominal pain, and nausea. She reports that despite being on the birth control pill she has not yet started her period and this one weekly. She is not sexually active. She has had issues with irregular menses for some time. she has been followed by endocrinology. Her caregivers report that this is extremely anxiety coping with them because they're unable to determine whether her symptoms of nausea, abdominal pain are related to oncoming menses or other causes. They have trouble dealing with her unpredictable bleeding. The patient is frequently complaining of abdominal pain, back pain, leg pain which has been evaluated extensively and is currently treated with several medications including Cymbalta, Klonopin, Neurontin. She also occasionally takes Vicodin at night for severe pain. Caregiver sometimes reports they're concerned that they're over treating her but they feel nervous about ignoring any of her symptoms. They deny any recent fever or chills. Patient has had some clear nasal congestion and cough. She also complains of nausea but has not had vomiting. Her appetite normal.   Outpatient Encounter Prescriptions as of 11/05/2012  Medication Sig Dispense Refill  . albuterol (PROVENTIL HFA;VENTOLIN HFA) 108 (90 BASE) MCG/ACT inhaler Inhale 2 puffs into the lungs every 6 (six) hours as needed.      Marland Kitchen albuterol (PROVENTIL) (2.5 MG/3ML) 0.083% nebulizer solution Take 2.5 mg by nebulization every 6 (six) hours as needed.      . chlorproMAZINE (THORAZINE) 25 MG tablet Take 1 tablet (25 mg total) by mouth as directed. 2 tab in AM and 4 tabs at bedtime  180 tablet  3  . clonazePAM (KLONOPIN) 0.5 MG tablet Take 0.5 mg by mouth 2 (two) times daily.      .  diphenhydrAMINE (BENADRYL) 25 MG tablet Take 1 tablet (25 mg total) by mouth at bedtime as needed for allergies.  30 tablet  6  . drospirenone-ethinyl estradiol (LORYNA) 3-0.02 MG tablet Take 1 tablet by mouth daily.      . DULoxetine (CYMBALTA) 60 MG capsule Take 1 capsule (60 mg total) by mouth daily.  30 capsule  6  . furosemide (LASIX) 20 MG tablet TAKE ONE TABLET BY MOUTH EVERY DAY  30 tablet  3  . gabapentin (NEURONTIN) 300 MG capsule Take 1 capsule (300 mg total) by mouth 4 (four) times daily.  120 capsule  3  . gentamicin ointment (GARAMYCIN) 0.1 % Apply topically 3 (three) times daily.  15 g  0  . HYDROcodone-acetaminophen (NORCO) 7.5-325 MG per tablet Take 0.5-1 tablets by mouth 2 (two) times daily as needed.  60 tablet  0  . loratadine (CLARITIN) 10 MG tablet Take 1 tablet (10 mg total) by mouth daily.  30 tablet  11  . meloxicam (MOBIC) 15 MG tablet Take 1 tablet (15 mg total) by mouth daily.  30 tablet  0  . pantoprazole (PROTONIX) 40 MG tablet Take 1 tablet (40 mg total) by mouth 2 (two) times daily.  180 tablet  1  . polyethylene glycol (MIRALAX / GLYCOLAX) packet Take 17 g by mouth 2 (two) times daily.      . prazosin (MINIPRESS) 2 MG capsule Take 3 mg by mouth at bedtime.       . topiramate (TOPAMAX) 25 MG capsule Take 2 capsules (  50 mg total) by mouth 2 (two) times daily.  120 capsule  3  . traMADol (ULTRAM) 50 MG tablet Take 50 mg by mouth every 6 (six) hours as needed.      . [DISCONTINUED] ciprofloxacin (CIPRO) 500 MG tablet Take 1 tablet (500 mg total) by mouth 2 (two) times daily.  14 tablet  0  . [DISCONTINUED] diclofenac (FLECTOR) 1.3 % PTCH Place 1 patch onto the skin as directed.      . [DISCONTINUED] diphenhydrAMINE (BENADRYL) 25 MG tablet Take 1 tablet (25 mg total) by mouth at bedtime as needed for allergies.  30 tablet  0  . [DISCONTINUED] sulfamethoxazole-trimethoprim (BACTRIM DS) 800-160 MG per tablet        BP 122/78  Pulse 98  Temp 98.3 F (36.8 C) (Oral)   Resp 16  Wt 201 lb 8 oz (91.4 kg)  SpO2 97%  LMP 10/08/2012  Review of Systems  Constitutional: Negative for fever, chills, appetite change, fatigue and unexpected weight change.  HENT: Negative for ear pain, congestion, sore throat, trouble swallowing, neck pain, voice change and sinus pressure.   Eyes: Negative for visual disturbance.  Respiratory: Positive for cough. Negative for shortness of breath, wheezing and stridor.   Cardiovascular: Negative for chest pain, palpitations and leg swelling.  Gastrointestinal: Positive for abdominal pain. Negative for nausea, vomiting, diarrhea, constipation, blood in stool, abdominal distention and anal bleeding.  Genitourinary: Positive for menstrual problem. Negative for dysuria, frequency, hematuria and flank pain.  Musculoskeletal: Positive for myalgias, back pain and arthralgias. Negative for gait problem.  Skin: Negative for color change and rash.  Neurological: Negative for dizziness and headaches.  Hematological: Negative for adenopathy. Does not bruise/bleed easily.  Psychiatric/Behavioral: Negative for suicidal ideas, sleep disturbance and dysphoric mood. The patient is not nervous/anxious.        Objective:   Physical Exam  Constitutional: She is oriented to person, place, and time. She appears well-developed and well-nourished. No distress.  HENT:  Head: Normocephalic and atraumatic.  Right Ear: External ear normal.  Left Ear: External ear normal.  Nose: Nose normal.  Mouth/Throat: Oropharynx is clear and moist. No oropharyngeal exudate.  Eyes: Conjunctivae normal are normal. Pupils are equal, round, and reactive to light. Right eye exhibits no discharge. Left eye exhibits no discharge. No scleral icterus.  Neck: Normal range of motion. Neck supple. No tracheal deviation present. No thyromegaly present.  Cardiovascular: Normal rate, regular rhythm, normal heart sounds and intact distal pulses.  Exam reveals no gallop and no friction  rub.   No murmur heard. Pulmonary/Chest: Effort normal and breath sounds normal. No respiratory distress. She has no wheezes. She has no rales. She exhibits no tenderness.  Musculoskeletal: Normal range of motion. She exhibits no edema and no tenderness.  Lymphadenopathy:    She has no cervical adenopathy.  Neurological: She is alert and oriented to person, place, and time. No cranial nerve deficit. She exhibits normal muscle tone. Coordination normal.  Skin: Skin is warm and dry. No rash noted. She is not diaphoretic. No erythema. No pallor.  Psychiatric: She has a normal mood and affect. Thought content normal. Her speech is delayed. She is withdrawn. Cognition and memory are impaired. She expresses impulsivity and inappropriate judgment.          Assessment & Plan:

## 2012-11-05 NOTE — Assessment & Plan Note (Signed)
Symptoms of epigastric abdominal pain, nausea persistent despite use of pantoprazole BID. Will check for H. Pylori. If symptoms are persistent, will set up follow up with GI.

## 2012-11-05 NOTE — Assessment & Plan Note (Signed)
Symptoms of cough. Question if this may related to allergies. Exam is normal today. Will request results on recent allergy testing. Will continue Claritin and Benadryl. Given persistence of symptoms, will check CXR. Follow up 2 weeks.

## 2012-11-05 NOTE — Assessment & Plan Note (Signed)
Pt with irregular menses despite use of OCP. Discussion with caregivers about stress related to irregular menses and extensive workup with multiple physicians including endocrinologist. Difficult to assess if nausea and pain related to menses at times.  Will set up referral to OB. Question if this pt might benefit from hysterectomy given ongoing issue with irregular menses, nausea, and chronic abdominal pain.

## 2012-11-05 NOTE — Assessment & Plan Note (Signed)
Pt continues to complain of diffuse pain. On neurontin, with use of hydrocodone prn. Will continue for now. Discussion with caregivers about how difficult it is to assess extent of pt pain given developmental delay. Exam is normal. Will try to limit use of hydrocodone as much as possible.

## 2012-11-06 LAB — URINE CULTURE: Colony Count: 35000

## 2012-11-08 ENCOUNTER — Telehealth: Payer: Self-pay | Admitting: Internal Medicine

## 2012-11-08 DIAGNOSIS — F4310 Post-traumatic stress disorder, unspecified: Secondary | ICD-10-CM | POA: Diagnosis not present

## 2012-11-08 DIAGNOSIS — F70 Mild intellectual disabilities: Secondary | ICD-10-CM | POA: Diagnosis not present

## 2012-11-08 DIAGNOSIS — F330 Major depressive disorder, recurrent, mild: Secondary | ICD-10-CM | POA: Diagnosis not present

## 2012-11-08 NOTE — Telephone Encounter (Signed)
Returning your call. °

## 2012-11-09 LAB — HELICOBACTER PYLORI  ANTIBODY, IGM: Helicobacter pylori, IgM: 1.2 U/mL (ref <9.0)

## 2012-11-10 ENCOUNTER — Other Ambulatory Visit: Payer: Self-pay | Admitting: Internal Medicine

## 2012-11-10 ENCOUNTER — Other Ambulatory Visit (INDEPENDENT_AMBULATORY_CARE_PROVIDER_SITE_OTHER): Payer: Medicare Other

## 2012-11-10 DIAGNOSIS — R300 Dysuria: Secondary | ICD-10-CM | POA: Diagnosis not present

## 2012-11-10 LAB — POCT URINALYSIS DIPSTICK
Leukocytes, UA: NEGATIVE
Nitrite, UA: NEGATIVE
Protein, UA: NEGATIVE
Urobilinogen, UA: 1
pH, UA: 6.5

## 2012-11-12 LAB — URINE CULTURE

## 2012-11-16 DIAGNOSIS — N926 Irregular menstruation, unspecified: Secondary | ICD-10-CM | POA: Diagnosis not present

## 2012-11-16 DIAGNOSIS — N946 Dysmenorrhea, unspecified: Secondary | ICD-10-CM | POA: Diagnosis not present

## 2012-11-19 ENCOUNTER — Ambulatory Visit: Payer: Medicare Other | Admitting: Internal Medicine

## 2012-11-22 DIAGNOSIS — N926 Irregular menstruation, unspecified: Secondary | ICD-10-CM | POA: Diagnosis not present

## 2012-11-22 DIAGNOSIS — N949 Unspecified condition associated with female genital organs and menstrual cycle: Secondary | ICD-10-CM | POA: Diagnosis not present

## 2012-11-22 DIAGNOSIS — N946 Dysmenorrhea, unspecified: Secondary | ICD-10-CM | POA: Diagnosis not present

## 2012-11-22 DIAGNOSIS — N938 Other specified abnormal uterine and vaginal bleeding: Secondary | ICD-10-CM | POA: Diagnosis not present

## 2012-11-24 ENCOUNTER — Telehealth: Payer: Self-pay | Admitting: Internal Medicine

## 2012-11-24 MED ORDER — FLUTICASONE PROPIONATE 50 MCG/ACT NA SUSP: 2.0000 | Freq: Every day | NASAL | Status: DC

## 2012-11-24 NOTE — Telephone Encounter (Signed)
We could try adding Flonase 1 spray each nostril twice daily. Disp 1 bottle. Refill 3. If no improvement, may ultimately need referral to ENT>

## 2012-11-24 NOTE — Telephone Encounter (Signed)
Med filled.  

## 2012-11-24 NOTE — Telephone Encounter (Signed)
When patient came in isn't helping her was the E-Q allergy tab dad states patient is still stopped up, nose running and headache, he wanted to know if we could call something into her pharmacy.  Patient uses [facility] [facility]. Please advise.

## 2012-12-03 ENCOUNTER — Encounter: Payer: Self-pay | Admitting: Internal Medicine

## 2012-12-03 ENCOUNTER — Ambulatory Visit (INDEPENDENT_AMBULATORY_CARE_PROVIDER_SITE_OTHER): Payer: Medicare Other | Admitting: Internal Medicine

## 2012-12-03 VITALS — BP 130/78 | HR 100 | Temp 98.1°F | Resp 16 | Wt 196.2 lb

## 2012-12-03 DIAGNOSIS — J40 Bronchitis, not specified as acute or chronic: Secondary | ICD-10-CM | POA: Diagnosis not present

## 2012-12-03 DIAGNOSIS — H6690 Otitis media, unspecified, unspecified ear: Secondary | ICD-10-CM | POA: Diagnosis not present

## 2012-12-03 DIAGNOSIS — H6691 Otitis media, unspecified, right ear: Secondary | ICD-10-CM

## 2012-12-03 MED ORDER — CEFDINIR 300 MG PO CAPS: 300.0000 mg | ORAL_CAPSULE | Freq: Two times a day (BID) | ORAL | Status: DC

## 2012-12-03 MED ORDER — BENZONATATE 200 MG PO CAPS: 200.0000 mg | ORAL_CAPSULE | Freq: Three times a day (TID) | ORAL | Status: DC | PRN

## 2012-12-03 NOTE — Assessment & Plan Note (Signed)
Symptoms are most consistent with viral upper respiratory infection with early secondary bronchitis. Will treat with Omnicef x 10 days. Will add tessalon for cough. Pt will call or RTC if symptoms are persistent or worsening.  Follow up for recheck 4 weeks.

## 2012-12-03 NOTE — Progress Notes (Signed)
Subjective:    Patient ID: Jamie Ross, female    DOB: 12/06/1986, 26 y.o.   MRN: [id number]  HPI 26YO female with h/o developmental delay, chronic pain, asthma, presents for acute visit c/o 1 week h/o fever, chills, malaise, nasal congestion, sore throat, ear pain, cough productive of purulent sputum, wheezing, dyspnea.  Pt has been taking OTC Dayquil with no improvement. Father is sick with similar symptoms.  Outpatient Encounter Prescriptions as of 12/03/2012  Medication Sig Dispense Refill  . albuterol (PROVENTIL HFA;VENTOLIN HFA) 108 (90 BASE) MCG/ACT inhaler Inhale 2 puffs into the lungs every 6 (six) hours as needed.      Marland Kitchen albuterol (PROVENTIL) (2.5 MG/3ML) 0.083% nebulizer solution Take 2.5 mg by nebulization every 6 (six) hours as needed.      . chlorproMAZINE (THORAZINE) 25 MG tablet Take 1 tablet (25 mg total) by mouth as directed. 2 tab in AM and 4 tabs at bedtime  180 tablet  3  . clonazePAM (KLONOPIN) 0.5 MG tablet Take 0.5 mg by mouth 2 (two) times daily.      . diphenhydrAMINE (BENADRYL) 25 MG tablet Take 1 tablet (25 mg total) by mouth at bedtime as needed for allergies.  30 tablet  6  . drospirenone-ethinyl estradiol (LORYNA) 3-0.02 MG tablet Take 1 tablet by mouth daily.      . DULoxetine (CYMBALTA) 60 MG capsule Take 1 capsule (60 mg total) by mouth daily.  30 capsule  6  . fluticasone (FLONASE) 50 MCG/ACT nasal spray Place 2 sprays into the nose daily.  16 g  3  . furosemide (LASIX) 20 MG tablet TAKE ONE TABLET BY MOUTH EVERY DAY  30 tablet  3  . gabapentin (NEURONTIN) 300 MG capsule Take 1 capsule (300 mg total) by mouth 4 (four) times daily.  120 capsule  3  . gentamicin ointment (GARAMYCIN) 0.1 % Apply topically 3 (three) times daily.  15 g  0  . HYDROcodone-acetaminophen (NORCO) 7.5-325 MG per tablet Take 0.5-1 tablets by mouth 2 (two) times daily as needed.  60 tablet  0  . loratadine (CLARITIN) 10 MG tablet Take 1 tablet (10 mg total) by mouth daily.  30  tablet  11  . meloxicam (MOBIC) 15 MG tablet Take 1 tablet (15 mg total) by mouth daily.  30 tablet  0  . pantoprazole (PROTONIX) 40 MG tablet Take 1 tablet (40 mg total) by mouth 2 (two) times daily.  180 tablet  1  . polyethylene glycol (MIRALAX / GLYCOLAX) packet Take 17 g by mouth 2 (two) times daily.      . prazosin (MINIPRESS) 2 MG capsule Take 3 mg by mouth at bedtime.       . topiramate (TOPAMAX) 25 MG capsule Take 2 capsules (50 mg total) by mouth 2 (two) times daily.  120 capsule  3  . traMADol (ULTRAM) 50 MG tablet Take 50 mg by mouth every 6 (six) hours as needed.       BP 130/78  Pulse 100  Temp 98.1 F (36.7 C) (Oral)  Resp 16  Wt 196 lb 4 oz (89.018 kg)  SpO2 95%  Review of Systems  Constitutional: Positive for fever and chills. Negative for appetite change, fatigue and unexpected weight change.  HENT: Positive for ear pain, congestion, rhinorrhea, postnasal drip and sinus pressure. Negative for sore throat, trouble swallowing, neck pain and voice change.   Eyes: Negative for visual disturbance.  Respiratory: Positive for cough, shortness of breath and wheezing. Negative for  stridor.   Cardiovascular: Negative for chest pain, palpitations and leg swelling.  Gastrointestinal: Negative for nausea, vomiting, abdominal pain, diarrhea, constipation, blood in stool, abdominal distention and anal bleeding.  Genitourinary: Negative for dysuria and flank pain.  Musculoskeletal: Negative for myalgias, arthralgias and gait problem.  Skin: Negative for color change and rash.  Neurological: Negative for dizziness and headaches.  Hematological: Negative for adenopathy. Does not bruise/bleed easily.  Psychiatric/Behavioral: Negative for suicidal ideas, sleep disturbance and dysphoric mood. The patient is not nervous/anxious.        Objective:   Physical Exam  Constitutional: She is oriented to person, place, and time. She appears well-developed and well-nourished. No distress.    HENT:  Head: Normocephalic and atraumatic.  Right Ear: External ear normal. Tympanic membrane is erythematous and bulging. A middle ear effusion is present.  Left Ear: External ear normal. Tympanic membrane is not erythematous and not bulging.  No middle ear effusion.  Nose: Nose normal.  Mouth/Throat: Posterior oropharyngeal erythema present. No oropharyngeal exudate.  Eyes: Conjunctivae normal are normal. Pupils are equal, round, and reactive to light. Right eye exhibits no discharge. Left eye exhibits no discharge. No scleral icterus.  Neck: Normal range of motion. Neck supple. No tracheal deviation present. No thyromegaly present.  Cardiovascular: Normal rate, regular rhythm, normal heart sounds and intact distal pulses.  Exam reveals no gallop and no friction rub.   No murmur heard. Pulmonary/Chest: Effort normal. No accessory muscle usage. Not tachypneic. No respiratory distress. She has no decreased breath sounds. She has no wheezes. She has rhonchi (few scattered). She has no rales. She exhibits no tenderness.  Musculoskeletal: Normal range of motion. She exhibits no edema and no tenderness.  Lymphadenopathy:    She has no cervical adenopathy.  Neurological: She is alert and oriented to person, place, and time. No cranial nerve deficit. She exhibits normal muscle tone. Coordination normal.  Skin: Skin is warm and dry. No rash noted. She is not diaphoretic. No erythema. No pallor.  Psychiatric: She has a normal mood and affect. Her behavior is normal. Judgment and thought content normal.          Assessment & Plan:

## 2012-12-03 NOTE — Assessment & Plan Note (Signed)
Exam is most consistent with right otitis media. Will treat with Omnicef. Pt will use Tylenol prn pain. Pt will RTC in 4 weeks for recheck or earlier as needed if symptoms are persistent.

## 2012-12-13 DIAGNOSIS — F70 Mild intellectual disabilities: Secondary | ICD-10-CM | POA: Diagnosis not present

## 2012-12-13 DIAGNOSIS — F330 Major depressive disorder, recurrent, mild: Secondary | ICD-10-CM | POA: Diagnosis not present

## 2012-12-13 DIAGNOSIS — F4310 Post-traumatic stress disorder, unspecified: Secondary | ICD-10-CM | POA: Diagnosis not present

## 2012-12-14 ENCOUNTER — Telehealth: Payer: Self-pay | Admitting: Internal Medicine

## 2012-12-14 NOTE — Telephone Encounter (Signed)
Patient is still having headaches her father wants Vicodin called into the pharmacy for her and he wants a call when it is done.

## 2012-12-14 NOTE — Telephone Encounter (Signed)
Pt should use Tylenol as needed for headaches.  Vicodin is a narcotic and can make headaches worse. If pt has need for long term narcotics, we will need to make referral to pain clinic.

## 2012-12-14 NOTE — Telephone Encounter (Signed)
Patients dad advised as instructed via telephone, he stated that he will wait to see Dr. Dan Humphreys on 01/03/2013 during Floris's appt.

## 2012-12-25 ENCOUNTER — Other Ambulatory Visit: Payer: Self-pay | Admitting: Internal Medicine

## 2013-01-01 ENCOUNTER — Emergency Department: Payer: Self-pay | Admitting: Emergency Medicine

## 2013-01-01 DIAGNOSIS — N390 Urinary tract infection, site not specified: Secondary | ICD-10-CM | POA: Diagnosis not present

## 2013-01-01 DIAGNOSIS — Z79899 Other long term (current) drug therapy: Secondary | ICD-10-CM | POA: Diagnosis not present

## 2013-01-01 DIAGNOSIS — F909 Attention-deficit hyperactivity disorder, unspecified type: Secondary | ICD-10-CM | POA: Diagnosis not present

## 2013-01-01 DIAGNOSIS — F4310 Post-traumatic stress disorder, unspecified: Secondary | ICD-10-CM | POA: Diagnosis not present

## 2013-01-01 DIAGNOSIS — F329 Major depressive disorder, single episode, unspecified: Secondary | ICD-10-CM | POA: Diagnosis not present

## 2013-01-01 DIAGNOSIS — R4586 Emotional lability: Secondary | ICD-10-CM | POA: Diagnosis not present

## 2013-01-01 DIAGNOSIS — F79 Unspecified intellectual disabilities: Secondary | ICD-10-CM | POA: Diagnosis not present

## 2013-01-01 DIAGNOSIS — Z9089 Acquired absence of other organs: Secondary | ICD-10-CM | POA: Diagnosis not present

## 2013-01-01 DIAGNOSIS — F3289 Other specified depressive episodes: Secondary | ICD-10-CM | POA: Diagnosis not present

## 2013-01-01 LAB — DRUG SCREEN, URINE
Amphetamines, Ur Screen: NEGATIVE (ref ?–1000)
Barbiturates, Ur Screen: NEGATIVE (ref ?–200)
Cannabinoid 50 Ng, Ur ~~LOC~~: NEGATIVE (ref ?–50)
Methadone, Ur Screen: NEGATIVE (ref ?–300)
Tricyclic, Ur Screen: NEGATIVE (ref ?–1000)

## 2013-01-01 LAB — URINALYSIS, COMPLETE
Bilirubin,UR: NEGATIVE
Blood: NEGATIVE
Ketone: NEGATIVE
Ph: 7 (ref 4.5–8.0)
RBC,UR: 5 /HPF (ref 0–5)
Specific Gravity: 1.018 (ref 1.003–1.030)
WBC UR: 20 /HPF (ref 0–5)

## 2013-01-01 LAB — CBC
HCT: 37.9 % (ref 35.0–47.0)
HGB: 12.5 g/dL (ref 12.0–16.0)
MCH: 31.5 pg (ref 26.0–34.0)
MCV: 95 fL (ref 80–100)
RBC: 3.98 10*6/uL (ref 3.80–5.20)
WBC: 14.7 10*3/uL — ABNORMAL HIGH (ref 3.6–11.0)

## 2013-01-01 LAB — COMPREHENSIVE METABOLIC PANEL
Albumin: 3.7 g/dL (ref 3.4–5.0)
Anion Gap: 9 (ref 7–16)
BUN: 8 mg/dL (ref 7–18)
Chloride: 109 mmol/L — ABNORMAL HIGH (ref 98–107)
Co2: 23 mmol/L (ref 21–32)
Creatinine: 0.82 mg/dL (ref 0.60–1.30)
EGFR (African American): >60
Glucose: 112 mg/dL — ABNORMAL HIGH (ref 65–99)
Osmolality: 280 (ref 275–301)
Potassium: 3.7 mmol/L (ref 3.5–5.1)
SGOT(AST): 17 U/L (ref 15–37)
Total Protein: 7.7 g/dL (ref 6.4–8.2)

## 2013-01-01 LAB — ETHANOL
Ethanol %: <0.003 % (ref 0.000–0.080)
Ethanol: <3 mg/dL

## 2013-01-01 LAB — PREGNANCY, URINE: Pregnancy Test, Urine: NEGATIVE m[IU]/mL

## 2013-01-03 ENCOUNTER — Encounter: Payer: Self-pay | Admitting: Internal Medicine

## 2013-01-03 ENCOUNTER — Ambulatory Visit (INDEPENDENT_AMBULATORY_CARE_PROVIDER_SITE_OTHER): Payer: Medicare Other | Admitting: Internal Medicine

## 2013-01-03 ENCOUNTER — Ambulatory Visit: Payer: Self-pay | Admitting: Internal Medicine

## 2013-01-03 ENCOUNTER — Telehealth: Payer: Self-pay | Admitting: Internal Medicine

## 2013-01-03 VITALS — BP 112/72 | Temp 99.5°F | Ht 61.5 in | Wt 197.8 lb

## 2013-01-03 DIAGNOSIS — R51 Headache: Secondary | ICD-10-CM | POA: Diagnosis not present

## 2013-01-03 DIAGNOSIS — N390 Urinary tract infection, site not specified: Secondary | ICD-10-CM | POA: Diagnosis not present

## 2013-01-03 DIAGNOSIS — R519 Headache, unspecified: Secondary | ICD-10-CM | POA: Insufficient documentation

## 2013-01-03 LAB — POCT URINALYSIS DIPSTICK
Nitrite, UA: NEGATIVE
Urobilinogen, UA: 0.2
pH, UA: 5

## 2013-01-03 MED ORDER — IBUPROFEN 800 MG PO TABS: 800.0000 mg | ORAL_TABLET | Freq: Three times a day (TID) | ORAL | Status: DC | PRN

## 2013-01-03 NOTE — Telephone Encounter (Signed)
Patient's dad Harvie Heck) notified as instructed by telephone. Advised dad that Amber will be in touch regarding the MRI.

## 2013-01-03 NOTE — Telephone Encounter (Signed)
See below, also can you ask her to return to clinic for Prolactin level as this was NOT performed at the hospital.

## 2013-01-03 NOTE — Telephone Encounter (Signed)
CT head was normal. The radiologist recommended MRI for further evaluation.  Last MRI was 12/2011. Can you ask her parents if they think she would be able to lie still for another MRI to re-look at her pituitary

## 2013-01-03 NOTE — Progress Notes (Signed)
Subjective:    Patient ID: Jamie Ross, female    DOB: 1986/09/28, 27 y.o.   MRN: [id number]  HPI 27 year old female with history of developmental delay presents for acute visit complaining of approximately 5 day history of severe diffuse headache. She describes this headache as the worst in her life. She went to the ER on Saturday and had evaluation including lab work and urinalysis which showed urinary tract infection. She was treated with Cipro and sent home. She was told that she might be having a reaction to Depo-Provera shot. She was given ibuprofen 800 mg to take up to 3 times daily. She reports some improvement with this. She denies visual changes. She does have occasional nausea. She notes some urinary urgency, but no flank pain, fever/chills. She notes recent leakage of fluid from her breasts, which has occurred in the past. She also notes some "strange sexual feelings." She attributes this to the use of Depo.  Outpatient Encounter Prescriptions as of 01/03/2013  Medication Sig Dispense Refill  . albuterol (PROVENTIL HFA;VENTOLIN HFA) 108 (90 BASE) MCG/ACT inhaler Inhale 2 puffs into the lungs every 6 (six) hours as needed.      Marland Kitchen albuterol (PROVENTIL) (2.5 MG/3ML) 0.083% nebulizer solution Take 2.5 mg by nebulization every 6 (six) hours as needed.      . ciprofloxacin (CIPRO) 500 MG tablet Take 500 mg by mouth 2 (two) times daily.      . clonazePAM (KLONOPIN) 0.5 MG tablet Take 0.5 mg by mouth 2 (two) times daily.      . diphenhydrAMINE (BENADRYL) 25 MG tablet Take 1 tablet (25 mg total) by mouth at bedtime as needed for allergies.  30 tablet  6  . drospirenone-ethinyl estradiol (LORYNA) 3-0.02 MG tablet Take 1 tablet by mouth daily.      . DULoxetine (CYMBALTA) 60 MG capsule Take 1 capsule (60 mg total) by mouth daily.  30 capsule  6  . fluticasone (FLONASE) 50 MCG/ACT nasal spray Place 2 sprays into the nose daily.  16 g  3  . furosemide (LASIX) 20 MG tablet TAKE ONE TABLET BY  MOUTH EVERY DAY  30 tablet  2  . gabapentin (NEURONTIN) 300 MG capsule Take 1 capsule (300 mg total) by mouth 4 (four) times daily.  120 capsule  3  . HYDROcodone-acetaminophen (NORCO) 7.5-325 MG per tablet Take 0.5-1 tablets by mouth 2 (two) times daily as needed.  60 tablet  0  . loratadine (CLARITIN) 10 MG tablet Take 10 mg by mouth daily as needed.      . pantoprazole (PROTONIX) 40 MG tablet Take 40 mg by mouth daily.      . polyethylene glycol (MIRALAX / GLYCOLAX) packet Take 17 g by mouth 2 (two) times daily.      . prazosin (MINIPRESS) 2 MG capsule Take 3 mg by mouth at bedtime.       . topiramate (TOPAMAX) 25 MG capsule Take 2 capsules (50 mg total) by mouth 2 (two) times daily.  120 capsule  3  . chlorproMAZINE (THORAZINE) 25 MG tablet Take 1 tablet (25 mg total) by mouth as directed. 2 tab in AM and 4 tabs at bedtime  180 tablet  3  . ibuprofen (ADVIL,MOTRIN) 800 MG tablet Take 1 tablet (800 mg total) by mouth every 8 (eight) hours as needed for pain.  60 tablet  2   BP 112/72  Temp 99.5 F (37.5 C) (Oral)  Ht 5' 1.5" (1.562 m)  Wt 197 lb 12  oz (89.699 kg)  BMI 36.76 kg/m2  SpO2 97%  LMP 01/03/2013  Review of Systems  Constitutional: Negative for fever, chills, appetite change, fatigue and unexpected weight change.  HENT: Negative for ear pain, congestion, sore throat, trouble swallowing, neck pain, voice change and sinus pressure.   Eyes: Negative for visual disturbance.  Respiratory: Negative for cough, shortness of breath, wheezing and stridor.   Cardiovascular: Negative for chest pain, palpitations and leg swelling.  Gastrointestinal: Negative for nausea, vomiting, abdominal pain, diarrhea, constipation, blood in stool, abdominal distention and anal bleeding.  Genitourinary: Positive for dysuria and urgency. Negative for flank pain and pelvic pain.  Musculoskeletal: Negative for myalgias, arthralgias and gait problem.  Skin: Negative for color change and rash.    Neurological: Positive for headaches. Negative for dizziness, tremors, seizures, speech difficulty, weakness and numbness.  Hematological: Negative for adenopathy. Does not bruise/bleed easily.  Psychiatric/Behavioral: Negative for suicidal ideas, sleep disturbance and dysphoric mood. The patient is not nervous/anxious.        Objective:   Physical Exam  Constitutional: She is oriented to person, place, and time. She appears well-developed and well-nourished. No distress.  HENT:  Head: Normocephalic and atraumatic.  Right Ear: External ear normal.  Left Ear: External ear normal.  Nose: Nose normal.  Mouth/Throat: Oropharynx is clear and moist. No oropharyngeal exudate.  Eyes: Conjunctivae normal are normal. Pupils are equal, round, and reactive to light. Right eye exhibits no discharge. Left eye exhibits no discharge. No scleral icterus.  Neck: Normal range of motion. Neck supple. No tracheal deviation present. No thyromegaly present.  Cardiovascular: Normal rate, regular rhythm, normal heart sounds and intact distal pulses.  Exam reveals no gallop and no friction rub.   No murmur heard. Pulmonary/Chest: Effort normal and breath sounds normal. No respiratory distress. She has no wheezes. She has no rales. She exhibits no tenderness.  Musculoskeletal: Normal range of motion. She exhibits no edema and no tenderness.  Lymphadenopathy:    She has no cervical adenopathy.  Neurological: She is alert and oriented to person, place, and time. No cranial nerve deficit. She exhibits normal muscle tone. Coordination normal.  Skin: Skin is warm and dry. No rash noted. She is not diaphoretic. No erythema. No pallor.  Psychiatric: She has a normal mood and affect. Her speech is normal and behavior is normal. Thought content normal. Cognition and memory are impaired. She expresses impulsivity and inappropriate judgment. She exhibits abnormal recent memory and abnormal remote memory.           Assessment & Plan:

## 2013-01-03 NOTE — Telephone Encounter (Signed)
Spoke to patient's dad and was advised that he does think that patient can lie still for another MRI. Patient's dad Harvie Heck) advised that she has a follow-up appointment scheduled for Wednesday and wants to know if she can have the lab work Prolactin level checked that day?

## 2013-01-03 NOTE — Telephone Encounter (Signed)
That is fine 

## 2013-01-03 NOTE — Telephone Encounter (Signed)
CT head was normal. The radiologist recommended MRI for further evaluation. Can you ask her parents if she has had

## 2013-01-03 NOTE — Assessment & Plan Note (Signed)
Recent diagnosis of UTI at ED this weekend. On Cipro. Will request notes and urine culture results from [facility].

## 2013-01-03 NOTE — Assessment & Plan Note (Addendum)
Daily severe headache x 1 week, described as "worst" headache of life. Pt on Depo. Will get stat CT head without contrast for initial evaluation. Continue Ibuprofen 800mg  tid prn. If symptoms persistent and CT negative, will try increasing dose of Topamax to see if any improvement. Will also have pt follow up with neurology. Will check to see if prolactin level was drawn in ED.

## 2013-01-03 NOTE — Telephone Encounter (Signed)
I have placed order for MRI. Amber will schedule. Labs can be drawn at her follow up visit.

## 2013-01-05 ENCOUNTER — Ambulatory Visit: Payer: Medicare Other | Admitting: Internal Medicine

## 2013-01-05 ENCOUNTER — Telehealth: Payer: Self-pay | Admitting: Internal Medicine

## 2013-01-05 NOTE — Telephone Encounter (Signed)
Order for pt for MRI brain with and w/o contrast.  Pt had appt today and had to resched for tomorrow because they had not received the order from [place].  Terry called yesterday and asked for [place] to send it yesterday but did not receive.  She needs today before.  Fax:  [phone]

## 2013-01-06 ENCOUNTER — Telehealth: Payer: Self-pay | Admitting: *Deleted

## 2013-01-06 ENCOUNTER — Ambulatory Visit: Payer: Self-pay | Admitting: Internal Medicine

## 2013-01-06 DIAGNOSIS — R51 Headache: Secondary | ICD-10-CM | POA: Diagnosis not present

## 2013-01-06 NOTE — Telephone Encounter (Signed)
MRI was normal. I do not see results back yet from urine culture.

## 2013-01-06 NOTE — Telephone Encounter (Signed)
Dad calling asking for results of Urine culture and results of MRI that was done today at [facility], please advise

## 2013-01-07 NOTE — Telephone Encounter (Signed)
Advised patients father  

## 2013-01-12 ENCOUNTER — Encounter: Payer: Self-pay | Admitting: Internal Medicine

## 2013-01-12 ENCOUNTER — Ambulatory Visit (INDEPENDENT_AMBULATORY_CARE_PROVIDER_SITE_OTHER): Payer: Medicare Other | Admitting: Internal Medicine

## 2013-01-12 VITALS — BP 122/98 | HR 98 | Temp 98.5°F | Wt 192.0 lb

## 2013-01-12 DIAGNOSIS — R51 Headache: Secondary | ICD-10-CM | POA: Diagnosis not present

## 2013-01-12 DIAGNOSIS — N946 Dysmenorrhea, unspecified: Secondary | ICD-10-CM | POA: Diagnosis not present

## 2013-01-12 DIAGNOSIS — J309 Allergic rhinitis, unspecified: Secondary | ICD-10-CM | POA: Diagnosis not present

## 2013-01-12 DIAGNOSIS — J302 Other seasonal allergic rhinitis: Secondary | ICD-10-CM | POA: Insufficient documentation

## 2013-01-12 DIAGNOSIS — G8929 Other chronic pain: Secondary | ICD-10-CM | POA: Insufficient documentation

## 2013-01-12 MED ORDER — DULOXETINE HCL 30 MG PO CPEP: 30.0000 mg | ORAL_CAPSULE | Freq: Every day | ORAL | Status: DC

## 2013-01-12 MED ORDER — FEXOFENADINE HCL 60 MG PO TABS: 60.0000 mg | ORAL_TABLET | Freq: Every day | ORAL | Status: DC

## 2013-01-12 NOTE — Assessment & Plan Note (Signed)
Symptoms of irregular heavy menses. Poorly controlled with OCP. Now having headaches and mood swings with Depo. In my opinion, pt would benefit from hysterectomy given chronic issues. Discussed with pt and father today.

## 2013-01-12 NOTE — Progress Notes (Signed)
Subjective:    Patient ID: Jamie Ross, female    DOB: 1986/04/12, 27 y.o.   MRN: [id number]  HPI 27 year old female with history of developmental delay presents for followup after recent episodes of severe headache. Patient reports that headache has improved somewhat with the use of ibuprofen. She continues to have occasional headaches which wake her from sleep. They're described as diffuse and aching. They are not typically associated with nausea or visual changes. In the interim since her last visit, she had MRI of the brain which was normal. Her father questions whether use of Depo-Provera in Cymbalta may be contributing to headaches.  He notes that she had poor control of her menstrual cycles with use of oral contraceptives and she has had extreme moodiness with use of Depo-Provera. She continues to have intermittent heavy menstrual bleeding. He questions whether she might benefit from hysterectomy.  She also continues to have sneezing, clear nasal drainage despite use of Claritin and Flonase. Denies sinus pressure or pain. Denies fever, chills.  Outpatient Prescriptions Prior to Visit  Medication Sig Dispense Refill  . albuterol (PROVENTIL HFA;VENTOLIN HFA) 108 (90 BASE) MCG/ACT inhaler Inhale 2 puffs into the lungs every 6 (six) hours as needed.      Marland Kitchen albuterol (PROVENTIL) (2.5 MG/3ML) 0.083% nebulizer solution Take 2.5 mg by nebulization every 6 (six) hours as needed.      . chlorproMAZINE (THORAZINE) 25 MG tablet Take 1 tablet (25 mg total) by mouth as directed. 2 tab in AM and 4 tabs at bedtime  180 tablet  3  . clonazePAM (KLONOPIN) 0.5 MG tablet Take 0.5 mg by mouth 2 (two) times daily.      . fluticasone (FLONASE) 50 MCG/ACT nasal spray Place 2 sprays into the nose daily.  16 g  3  . furosemide (LASIX) 20 MG tablet TAKE ONE TABLET BY MOUTH EVERY DAY  30 tablet  2  . gabapentin (NEURONTIN) 300 MG capsule Take 1 capsule (300 mg total) by mouth 4 (four) times daily.  120 capsule   3  . ibuprofen (ADVIL,MOTRIN) 800 MG tablet Take 1 tablet (800 mg total) by mouth every 8 (eight) hours as needed for pain.  60 tablet  2  . loratadine (CLARITIN) 10 MG tablet Take 10 mg by mouth daily as needed.      . pantoprazole (PROTONIX) 40 MG tablet Take 40 mg by mouth 2 (two) times daily.       . polyethylene glycol (MIRALAX / GLYCOLAX) packet Take 17 g by mouth 2 (two) times daily.      Marland Kitchen topiramate (TOPAMAX) 25 MG capsule Take 2 capsules (50 mg total) by mouth 2 (two) times daily.  120 capsule  3   Last reviewed on 01/12/2013 10:05 AM by Theola Sequin  BP 122/98  Pulse 98  Temp 98.5 F (36.9 C) (Oral)  Wt 192 lb (87.091 kg)  LMP 01/03/2013  Review of Systems  Constitutional: Negative for fever, chills, appetite change, fatigue and unexpected weight change.  HENT: Positive for congestion, rhinorrhea, sneezing and postnasal drip. Negative for ear pain, sore throat, trouble swallowing, neck pain, voice change and sinus pressure.   Eyes: Negative for visual disturbance.  Respiratory: Negative for cough, shortness of breath, wheezing and stridor.   Cardiovascular: Negative for chest pain, palpitations and leg swelling.  Gastrointestinal: Negative for nausea, vomiting, abdominal pain, diarrhea, constipation, blood in stool, abdominal distention and anal bleeding.  Genitourinary: Negative for dysuria and flank pain.  Musculoskeletal: Negative for  myalgias, arthralgias and gait problem.  Skin: Negative for color change and rash.  Neurological: Positive for headaches. Negative for dizziness.  Hematological: Negative for adenopathy. Does not bruise/bleed easily.  Psychiatric/Behavioral: Positive for behavioral problems, sleep disturbance and decreased concentration. Negative for suicidal ideas and dysphoric mood. The patient is not nervous/anxious.        Objective:   Physical Exam  Constitutional: She is oriented to person, place, and time. She appears well-developed and  well-nourished. No distress.  HENT:  Head: Normocephalic and atraumatic.  Right Ear: External ear normal.  Left Ear: External ear normal.  Nose: Mucosal edema present.  Mouth/Throat: Oropharynx is clear and moist. No oropharyngeal exudate.  Eyes: Conjunctivae normal are normal. Pupils are equal, round, and reactive to light. Right eye exhibits no discharge. Left eye exhibits no discharge. No scleral icterus.  Neck: Normal range of motion. Neck supple. No tracheal deviation present. No thyromegaly present.  Cardiovascular: Normal rate, regular rhythm, normal heart sounds and intact distal pulses.  Exam reveals no gallop and no friction rub.   No murmur heard. Pulmonary/Chest: Effort normal and breath sounds normal. No accessory muscle usage. Not tachypneic. No respiratory distress. She has no decreased breath sounds. She has no wheezes. She has no rhonchi. She has no rales. She exhibits no tenderness.  Musculoskeletal: Normal range of motion. She exhibits no edema and no tenderness.  Lymphadenopathy:    She has no cervical adenopathy.  Neurological: She is alert and oriented to person, place, and time. No cranial nerve deficit. She exhibits normal muscle tone. Coordination normal.  Skin: Skin is warm and dry. No rash noted. She is not diaphoretic. No erythema. No pallor.  Psychiatric: She has a normal mood and affect. Thought content normal. Her speech is delayed. She is withdrawn. Cognition and memory are impaired. She expresses impulsivity and inappropriate judgment. She exhibits abnormal recent memory and abnormal remote memory.          Assessment & Plan:

## 2013-01-12 NOTE — Assessment & Plan Note (Signed)
Symptoms poorly controlled with Claritin and Flonase. Will try changing to Allegra. Follow up 1 month.

## 2013-01-12 NOTE — Assessment & Plan Note (Signed)
Chronic headaches with some improvement with use of Ibuprofen. Likely multifactorial with component of allergic rhinitis, medication side effects. Question if Depo-Provera contributed. Will plan not to repeat this medication dose as scheduled in March. Will also drop dose of Cymbalta to 30mg  daily with plan to get off this medication in 1 month.  MRI brain was normal. Will set up evaluation with headache specialist. Follow up here in 67month.

## 2013-01-14 ENCOUNTER — Encounter: Payer: Self-pay | Admitting: Internal Medicine

## 2013-01-24 DIAGNOSIS — F70 Mild intellectual disabilities: Secondary | ICD-10-CM | POA: Diagnosis not present

## 2013-01-24 DIAGNOSIS — F330 Major depressive disorder, recurrent, mild: Secondary | ICD-10-CM | POA: Diagnosis not present

## 2013-01-24 DIAGNOSIS — F4310 Post-traumatic stress disorder, unspecified: Secondary | ICD-10-CM | POA: Diagnosis not present

## 2013-01-31 DIAGNOSIS — R51 Headache: Secondary | ICD-10-CM | POA: Diagnosis not present

## 2013-01-31 DIAGNOSIS — G4733 Obstructive sleep apnea (adult) (pediatric): Secondary | ICD-10-CM | POA: Diagnosis not present

## 2013-01-31 DIAGNOSIS — E669 Obesity, unspecified: Secondary | ICD-10-CM | POA: Diagnosis not present

## 2013-02-08 DIAGNOSIS — F70 Mild intellectual disabilities: Secondary | ICD-10-CM | POA: Diagnosis not present

## 2013-02-08 DIAGNOSIS — F339 Major depressive disorder, recurrent, unspecified: Secondary | ICD-10-CM | POA: Diagnosis not present

## 2013-02-08 DIAGNOSIS — F4310 Post-traumatic stress disorder, unspecified: Secondary | ICD-10-CM | POA: Diagnosis not present

## 2013-02-09 ENCOUNTER — Ambulatory Visit: Payer: Medicare Other | Admitting: Internal Medicine

## 2013-02-11 ENCOUNTER — Ambulatory Visit: Payer: Medicare Other | Admitting: Internal Medicine

## 2013-02-15 ENCOUNTER — Telehealth: Payer: Self-pay | Admitting: Internal Medicine

## 2013-02-15 MED ORDER — POLYETHYLENE GLYCOL 3350 17 G PO PACK: 17.0000 g | PACK | Freq: Two times a day (BID) | ORAL | Status: DC

## 2013-02-15 NOTE — Telephone Encounter (Signed)
polyethylene glycol (MIRALAX / GLYCOLAX) packet   Patient's father wanting medication called in while they were in [place] . I informed him that you have 24-48 hours to fill medications.

## 2013-02-15 NOTE — Telephone Encounter (Signed)
Sent to pharmacy on file, [facility] on [address]

## 2013-02-22 ENCOUNTER — Encounter: Payer: Self-pay | Admitting: Internal Medicine

## 2013-02-22 ENCOUNTER — Ambulatory Visit (INDEPENDENT_AMBULATORY_CARE_PROVIDER_SITE_OTHER): Payer: Medicare Other | Admitting: Internal Medicine

## 2013-02-22 VITALS — BP 120/90 | HR 109 | Temp 98.6°F | Wt 194.0 lb

## 2013-02-22 DIAGNOSIS — R51 Headache: Secondary | ICD-10-CM | POA: Diagnosis not present

## 2013-02-22 DIAGNOSIS — R1013 Epigastric pain: Secondary | ICD-10-CM | POA: Diagnosis not present

## 2013-02-22 DIAGNOSIS — K5900 Constipation, unspecified: Secondary | ICD-10-CM | POA: Diagnosis not present

## 2013-02-22 DIAGNOSIS — R110 Nausea: Secondary | ICD-10-CM | POA: Diagnosis not present

## 2013-02-22 DIAGNOSIS — E039 Hypothyroidism, unspecified: Secondary | ICD-10-CM | POA: Diagnosis not present

## 2013-02-22 DIAGNOSIS — R112 Nausea with vomiting, unspecified: Secondary | ICD-10-CM | POA: Diagnosis not present

## 2013-02-22 DIAGNOSIS — G589 Mononeuropathy, unspecified: Secondary | ICD-10-CM | POA: Diagnosis not present

## 2013-02-22 DIAGNOSIS — J302 Other seasonal allergic rhinitis: Secondary | ICD-10-CM

## 2013-02-22 DIAGNOSIS — N926 Irregular menstruation, unspecified: Secondary | ICD-10-CM

## 2013-02-22 DIAGNOSIS — R21 Rash and other nonspecific skin eruption: Secondary | ICD-10-CM

## 2013-02-22 DIAGNOSIS — E669 Obesity, unspecified: Secondary | ICD-10-CM

## 2013-02-22 DIAGNOSIS — D510 Vitamin B12 deficiency anemia due to intrinsic factor deficiency: Secondary | ICD-10-CM

## 2013-02-22 DIAGNOSIS — G629 Polyneuropathy, unspecified: Secondary | ICD-10-CM

## 2013-02-22 DIAGNOSIS — J309 Allergic rhinitis, unspecified: Secondary | ICD-10-CM

## 2013-02-22 DIAGNOSIS — L708 Other acne: Secondary | ICD-10-CM | POA: Insufficient documentation

## 2013-02-22 LAB — CBC WITH DIFFERENTIAL/PLATELET
Basophils Relative: 0.3 % (ref 0.0–3.0)
Eosinophils Absolute: 0.1 10*3/uL (ref 0.0–0.7)
Eosinophils Relative: 1 % (ref 0.0–5.0)
Hemoglobin: 13.3 g/dL (ref 12.0–15.0)
Lymphocytes Relative: 20.9 % (ref 12.0–46.0)
MCHC: 33.8 g/dL (ref 30.0–36.0)
MCV: 94.4 fl (ref 78.0–100.0)
Neutro Abs: 7.3 10*3/uL (ref 1.4–7.7)
RBC: 4.16 Mil/uL (ref 3.87–5.11)
WBC: 10 10*3/uL (ref 4.5–10.5)

## 2013-02-22 LAB — COMPREHENSIVE METABOLIC PANEL
AST: 15 U/L (ref 0–37)
Alkaline Phosphatase: 88 U/L (ref 39–117)
BUN: 11 mg/dL (ref 6–23)
Calcium: 8.6 mg/dL (ref 8.4–10.5)
Creatinine, Ser: 1 mg/dL (ref 0.4–1.2)
Glucose, Bld: 112 mg/dL — ABNORMAL HIGH (ref 70–99)

## 2013-02-22 MED ORDER — POLYETHYLENE GLYCOL 3350 17 G PO PACK: 17.0000 g | PACK | Freq: Two times a day (BID) | ORAL | Status: DC

## 2013-02-22 MED ORDER — IBUPROFEN 800 MG PO TABS: 800.0000 mg | ORAL_TABLET | Freq: Three times a day (TID) | ORAL | Status: DC | PRN

## 2013-02-22 NOTE — Assessment & Plan Note (Signed)
Persistent symptoms of burning and patient's feet. Followed by neurology. Minimal improvement with Neurontin. Will recheck thyroid function, CBC, and electrolytes with labs today. Previous B12 was normal.

## 2013-02-22 NOTE — Assessment & Plan Note (Signed)
Rash over her chest and back most consistent with drug reaction. Question if this may be reaction to Brintellix, which was recently prescribed by her psychiatrist. (Reviewed literature which showed rash in 3% of pt). Encouraged her to discuss with psychiatrist.

## 2013-02-22 NOTE — Progress Notes (Signed)
Subjective:    Patient ID: Jamie Ross, female    DOB: December 22, 1985, 27 y.o.   MRN: [id number]  HPI 27 year old female with history of developmental delay, chronic neuropathic pain, GERD, seasonal allergies presents for followup. She has several concerns today. First, her parents note that she has complained of nausea and has been vomiting food particles on almost a daily basis. She has been compliant with Protonix. She has limited intake of ibuprofen however she reports she has been occasionally taking BC powders for headache. She denies any hematemesis or persistent abdominal pain. She has been followed by GI in the past and has had endoscopy showing also previously.  She is also concerned about chronic burning pain in her feet. This has not improved with use of Neurontin. She has been followed by neurology for this. However, on last visit neurology primary focus was controlling headache pain. She was placed on Medrol Dosepak with improvement and resolution of her chronic headaches.  She is also concerned about rash over her chest and back. This is been present for approximately one week. This initially started after starting a new medication prescribed by her psychiatrist for depression. The rash is described as itchy. She has not had any fever or chills. Next  She is also concerned about persistent nasal congestion and rhinorrhea. She reports no improvement with Allegra, Claritin, or Flonase. She denies fever or chills. She denies ear pain. She does have occasional dry cough. She denies shortness of breath.  Outpatient Encounter Prescriptions as of 02/22/2013  Medication Sig Dispense Refill  . albuterol (PROVENTIL HFA;VENTOLIN HFA) 108 (90 BASE) MCG/ACT inhaler Inhale 2 puffs into the lungs every 6 (six) hours as needed.      Marland Kitchen albuterol (PROVENTIL) (2.5 MG/3ML) 0.083% nebulizer solution Take 2.5 mg by nebulization every 6 (six) hours as needed.      Marland Kitchen BRINTELLIX 10 MG TABS       .  chlorproMAZINE (THORAZINE) 25 MG tablet Take 1 tablet (25 mg total) by mouth as directed. 2 tab in AM and 4 tabs at bedtime  180 tablet  3  . clonazePAM (KLONOPIN) 0.5 MG tablet Take 0.5 mg by mouth 2 (two) times daily.      . fexofenadine (ALLEGRA) 60 MG tablet Take 1 tablet (60 mg total) by mouth daily.  30 tablet  3  . fluticasone (FLONASE) 50 MCG/ACT nasal spray Place 2 sprays into the nose daily.  16 g  3  . furosemide (LASIX) 20 MG tablet TAKE ONE TABLET BY MOUTH EVERY DAY  30 tablet  2  . gabapentin (NEURONTIN) 300 MG capsule Take 1 capsule (300 mg total) by mouth 4 (four) times daily.  120 capsule  3  . ibuprofen (ADVIL,MOTRIN) 800 MG tablet Take 1 tablet (800 mg total) by mouth every 8 (eight) hours as needed for pain.  60 tablet  2  . ondansetron (ZOFRAN-ODT) 8 MG disintegrating tablet Take 8 mg by mouth every 8 (eight) hours as needed.      . pantoprazole (PROTONIX) 40 MG tablet Take 40 mg by mouth 2 (two) times daily.       . polyethylene glycol (MIRALAX / GLYCOLAX) packet Take 17 g by mouth 2 (two) times daily.  60 each  3  . prazosin (MINIPRESS) 1 MG capsule Take 3 mg by mouth at bedtime.      . topiramate (TOPAMAX) 25 MG capsule Take 2 capsules (50 mg total) by mouth 2 (two) times daily.  120 capsule  3   No facility-administered encounter medications on file as of 02/22/2013.   BP 120/90  Pulse 109  Temp(Src) 98.6 F (37 C) (Oral)  Wt 194 lb (87.998 kg)  BMI 36.07 kg/m2  SpO2 97%  LMP 12/25/2012  Review of Systems  Constitutional: Negative for fever, chills, appetite change, fatigue and unexpected weight change.  HENT: Positive for congestion, rhinorrhea and postnasal drip. Negative for ear pain, sore throat, trouble swallowing, neck pain, voice change and sinus pressure.   Eyes: Negative for visual disturbance.  Respiratory: Negative for cough, shortness of breath, wheezing and stridor.   Cardiovascular: Negative for chest pain, palpitations and leg swelling.   Gastrointestinal: Negative for nausea, vomiting, abdominal pain, diarrhea, constipation, blood in stool, abdominal distention and anal bleeding.  Genitourinary: Positive for menstrual problem. Negative for dysuria and flank pain.  Musculoskeletal: Negative for myalgias, arthralgias and gait problem.  Skin: Positive for rash. Negative for color change.  Neurological: Negative for dizziness and headaches.  Hematological: Negative for adenopathy. Does not bruise/bleed easily.  Psychiatric/Behavioral: Negative for suicidal ideas, sleep disturbance and dysphoric mood. The patient is not nervous/anxious.        Objective:   Physical Exam  Constitutional: She is oriented to person, place, and time. She appears well-developed and well-nourished. No distress.  HENT:  Head: Normocephalic and atraumatic.  Right Ear: External ear normal.  Left Ear: External ear normal.  Nose: Nose normal.  Mouth/Throat: Oropharynx is clear and moist. No oropharyngeal exudate.  Eyes: Conjunctivae are normal. Pupils are equal, round, and reactive to light. Right eye exhibits no discharge. Left eye exhibits no discharge. No scleral icterus.  Neck: Normal range of motion. Neck supple. No tracheal deviation present. No thyromegaly present.  Cardiovascular: Normal rate, regular rhythm, normal heart sounds and intact distal pulses.  Exam reveals no gallop and no friction rub.   No murmur heard. Pulmonary/Chest: Effort normal and breath sounds normal. No respiratory distress. She has no wheezes. She has no rales. She exhibits no tenderness.  Abdominal: Soft. Normal appearance and bowel sounds are normal. There is no tenderness.  Musculoskeletal: Normal range of motion. She exhibits no edema and no tenderness.  Lymphadenopathy:    She has no cervical adenopathy.  Neurological: She is alert and oriented to person, place, and time. No cranial nerve deficit. She exhibits normal muscle tone. Coordination normal.  Skin: Skin is  warm and dry. Rash noted. Rash is maculopapular (over trunk). She is not diaphoretic. No erythema. No pallor.  Psychiatric: She has a normal mood and affect. Her behavior is normal. Judgment and thought content normal.          Assessment & Plan:

## 2013-02-22 NOTE — Assessment & Plan Note (Signed)
Patient with irregular menses which has been chronic. Encouraged her to followup with OB/GYN. Question if she may need Provera to stimulate menstrual cycle and/or hysterectomy in the long run given numerous issues.

## 2013-02-22 NOTE — Assessment & Plan Note (Signed)
Persistent symptoms of nasal congestion and drainage consistent with allergic rhinitis. Discussed that antibiotics will not be helpful in treating this. Encouraged patient to continue nonsedating antihistamine and Flonase. Will set up allergy evaluation.

## 2013-02-22 NOTE — Assessment & Plan Note (Signed)
Symptoms of headache have improved after Medrol Dosepak given by neurology. We'll continue to monitor.

## 2013-02-22 NOTE — Assessment & Plan Note (Signed)
Wt Readings from Last 3 Encounters:  02/22/13 194 lb (87.998 kg)  01/12/13 192 lb (87.091 kg)  01/03/13 197 lb 12 oz (89.699 kg)   Weight has been stable over the last 3 visits. Strongly encouraged healthy diet and increase physical activity with goal of weight loss.

## 2013-02-22 NOTE — Assessment & Plan Note (Signed)
Symptoms of chronic nausea and occasional vomiting most consistent with GERD. Given that symptoms have been persisting, will set up a followup with GI physician. Question if she will need repeat endoscopy. Recent testing of H. pylori was negative. Continue pantoprazole. Continue Zofran as needed.

## 2013-02-23 ENCOUNTER — Telehealth: Payer: Self-pay | Admitting: Internal Medicine

## 2013-02-23 NOTE — Telephone Encounter (Signed)
I would recommend that she not take a multivitamin. She can just eat a diet rich in fruits and vegetables, and this will provide her with the nutrients that she needs.

## 2013-02-23 NOTE — Telephone Encounter (Signed)
States they forgot to ask Dr. Dan Humphreys at appt yesterday if she could prescribe her some vitamins.  States they have tried a few different kinds but they "jacked her up".  Wants to get something that will not make her hyper or make her eat a lot.  Pt would like a call if something is able to be called in.

## 2013-02-23 NOTE — Telephone Encounter (Signed)
Informed patient father and he agree understanding.

## 2013-02-23 NOTE — Telephone Encounter (Signed)
Spoke with patient father, some kind of vitamins that could may be prescribed so she will not put on weight and make her hyper. They have tried the Adult One a Day gummies worked all right and complete daily vitamin [employer] made her eat a lot. The gummies are doing ok but would like to know if there is anything else that you may recommend, something that would cover anything she may be missing from her diet.

## 2013-02-25 ENCOUNTER — Telehealth: Payer: Self-pay | Admitting: Internal Medicine

## 2013-02-25 ENCOUNTER — Other Ambulatory Visit: Payer: Medicare Other

## 2013-02-25 DIAGNOSIS — G589 Mononeuropathy, unspecified: Secondary | ICD-10-CM

## 2013-02-25 NOTE — Telephone Encounter (Signed)
Informed patient father of lab results.

## 2013-02-25 NOTE — Telephone Encounter (Signed)
Blood work results

## 2013-02-28 ENCOUNTER — Ambulatory Visit: Payer: Medicare Other

## 2013-02-28 DIAGNOSIS — R7309 Other abnormal glucose: Secondary | ICD-10-CM

## 2013-02-28 DIAGNOSIS — G589 Mononeuropathy, unspecified: Secondary | ICD-10-CM

## 2013-02-28 LAB — HEMOGLOBIN A1C: Hgb A1c MFr Bld: 5.7 % (ref 4.6–6.5)

## 2013-03-01 ENCOUNTER — Encounter: Payer: Self-pay | Admitting: *Deleted

## 2013-03-01 ENCOUNTER — Other Ambulatory Visit: Payer: Self-pay | Admitting: Internal Medicine

## 2013-03-01 DIAGNOSIS — N926 Irregular menstruation, unspecified: Secondary | ICD-10-CM | POA: Diagnosis not present

## 2013-03-01 DIAGNOSIS — N946 Dysmenorrhea, unspecified: Secondary | ICD-10-CM | POA: Diagnosis not present

## 2013-03-02 ENCOUNTER — Ambulatory Visit: Payer: Self-pay | Admitting: Gastroenterology

## 2013-03-02 DIAGNOSIS — R110 Nausea: Secondary | ICD-10-CM | POA: Diagnosis not present

## 2013-03-04 DIAGNOSIS — N926 Irregular menstruation, unspecified: Secondary | ICD-10-CM | POA: Diagnosis not present

## 2013-03-04 DIAGNOSIS — N946 Dysmenorrhea, unspecified: Secondary | ICD-10-CM | POA: Diagnosis not present

## 2013-03-08 ENCOUNTER — Ambulatory Visit: Payer: Self-pay | Admitting: Obstetrics and Gynecology

## 2013-03-08 DIAGNOSIS — Z01812 Encounter for preprocedural laboratory examination: Secondary | ICD-10-CM | POA: Diagnosis not present

## 2013-03-08 DIAGNOSIS — N926 Irregular menstruation, unspecified: Secondary | ICD-10-CM | POA: Diagnosis not present

## 2013-03-08 LAB — URINALYSIS, COMPLETE
Bilirubin,UR: NEGATIVE
Glucose,UR: NEGATIVE mg/dL (ref 0–75)
Ketone: NEGATIVE
Leukocyte Esterase: NEGATIVE
Nitrite: NEGATIVE
Squamous Epithelial: 1

## 2013-03-08 LAB — POTASSIUM: Potassium: 3.7 mmol/L (ref 3.5–5.1)

## 2013-03-14 DIAGNOSIS — F339 Major depressive disorder, recurrent, unspecified: Secondary | ICD-10-CM | POA: Diagnosis not present

## 2013-03-14 DIAGNOSIS — F4310 Post-traumatic stress disorder, unspecified: Secondary | ICD-10-CM | POA: Diagnosis not present

## 2013-03-14 DIAGNOSIS — F70 Mild intellectual disabilities: Secondary | ICD-10-CM | POA: Diagnosis not present

## 2013-03-15 ENCOUNTER — Ambulatory Visit: Payer: Self-pay | Admitting: Obstetrics and Gynecology

## 2013-03-15 DIAGNOSIS — N949 Unspecified condition associated with female genital organs and menstrual cycle: Secondary | ICD-10-CM | POA: Diagnosis not present

## 2013-03-15 DIAGNOSIS — G4733 Obstructive sleep apnea (adult) (pediatric): Secondary | ICD-10-CM | POA: Diagnosis not present

## 2013-03-15 DIAGNOSIS — J45909 Unspecified asthma, uncomplicated: Secondary | ICD-10-CM | POA: Diagnosis not present

## 2013-03-15 DIAGNOSIS — N946 Dysmenorrhea, unspecified: Secondary | ICD-10-CM | POA: Diagnosis not present

## 2013-03-15 DIAGNOSIS — IMO0002 Reserved for concepts with insufficient information to code with codable children: Secondary | ICD-10-CM | POA: Diagnosis not present

## 2013-03-15 DIAGNOSIS — F79 Unspecified intellectual disabilities: Secondary | ICD-10-CM | POA: Diagnosis not present

## 2013-03-15 DIAGNOSIS — Z880 Allergy status to penicillin: Secondary | ICD-10-CM | POA: Diagnosis not present

## 2013-03-15 DIAGNOSIS — N926 Irregular menstruation, unspecified: Secondary | ICD-10-CM | POA: Diagnosis not present

## 2013-03-15 DIAGNOSIS — F339 Major depressive disorder, recurrent, unspecified: Secondary | ICD-10-CM | POA: Diagnosis not present

## 2013-03-15 DIAGNOSIS — N938 Other specified abnormal uterine and vaginal bleeding: Secondary | ICD-10-CM | POA: Diagnosis not present

## 2013-03-16 LAB — PATHOLOGY REPORT

## 2013-03-22 ENCOUNTER — Encounter: Payer: Self-pay | Admitting: Internal Medicine

## 2013-03-22 ENCOUNTER — Ambulatory Visit (INDEPENDENT_AMBULATORY_CARE_PROVIDER_SITE_OTHER): Payer: Medicare Other | Admitting: Internal Medicine

## 2013-03-22 VITALS — BP 128/82 | HR 97 | Temp 98.4°F | Wt 193.0 lb

## 2013-03-22 DIAGNOSIS — E876 Hypokalemia: Secondary | ICD-10-CM | POA: Diagnosis not present

## 2013-03-22 DIAGNOSIS — F329 Major depressive disorder, single episode, unspecified: Secondary | ICD-10-CM | POA: Diagnosis not present

## 2013-03-22 DIAGNOSIS — G589 Mononeuropathy, unspecified: Secondary | ICD-10-CM | POA: Diagnosis not present

## 2013-03-22 DIAGNOSIS — N926 Irregular menstruation, unspecified: Secondary | ICD-10-CM | POA: Diagnosis not present

## 2013-03-22 DIAGNOSIS — N946 Dysmenorrhea, unspecified: Secondary | ICD-10-CM | POA: Diagnosis not present

## 2013-03-22 DIAGNOSIS — L259 Unspecified contact dermatitis, unspecified cause: Secondary | ICD-10-CM | POA: Diagnosis not present

## 2013-03-22 DIAGNOSIS — L309 Dermatitis, unspecified: Secondary | ICD-10-CM | POA: Insufficient documentation

## 2013-03-22 DIAGNOSIS — G629 Polyneuropathy, unspecified: Secondary | ICD-10-CM

## 2013-03-22 DIAGNOSIS — F32A Depression, unspecified: Secondary | ICD-10-CM

## 2013-03-22 DIAGNOSIS — F3289 Other specified depressive episodes: Secondary | ICD-10-CM | POA: Diagnosis not present

## 2013-03-22 LAB — BASIC METABOLIC PANEL
CO2: 20 mEq/L (ref 19–32)
Calcium: 8.8 mg/dL (ref 8.4–10.5)
Chloride: 105 mEq/L (ref 96–112)
Glucose, Bld: 88 mg/dL (ref 70–99)
Sodium: 136 mEq/L (ref 135–145)

## 2013-03-22 MED ORDER — FUROSEMIDE 20 MG PO TABS: ORAL_TABLET | ORAL | Status: DC

## 2013-03-22 MED ORDER — VORTIOXETINE HBR 10 MG PO TABS: 10.0000 mg | ORAL_TABLET | Freq: Every day | ORAL | Status: DC

## 2013-03-22 MED ORDER — GABAPENTIN 600 MG PO TABS: 600.0000 mg | ORAL_TABLET | Freq: Three times a day (TID) | ORAL | Status: DC

## 2013-03-22 NOTE — Progress Notes (Signed)
Subjective:    Patient ID: Jamie Ross, female    DOB: [birth year], 27 y.o.   MRN: [id number]  HPI 28 year old female with history of developmental delay, depression, neuropathy, and recent diffuse rash presents for followup. In the interim since her last visit, she was seen by her psychiatrist who felt that her symptoms of depression are much improved with use of Brintellix. He opted not to stop this medication despite ongoing rash. Over the last couple of weeks, the rash has improved. She continues to have a few erythematous papules over her chest, which are not bothersome to her. She reports that her mood has been much improved. Her parents describe her as more interactive.  She is concerned today about worsening burning pain in both of her feet. She has been taking Neurontin 300 mg 4 times daily with minimal improvement. In the past, she was on Cymbalta however this was stopped because of the use of Brintellix.  Outpatient Encounter Prescriptions as of 03/22/2013  Medication Sig Dispense Refill  . albuterol (PROVENTIL HFA;VENTOLIN HFA) 108 (90 BASE) MCG/ACT inhaler Inhale 2 puffs into the lungs every 6 (six) hours as needed.      Marland Kitchen albuterol (PROVENTIL) (2.5 MG/3ML) 0.083% nebulizer solution Take 2.5 mg by nebulization every 6 (six) hours as needed.      . chlorproMAZINE (THORAZINE) 25 MG tablet Take 1 tablet (25 mg total) by mouth as directed. 2 tab in AM and 4 tabs at bedtime  180 tablet  3  . clonazePAM (KLONOPIN) 0.5 MG tablet Take 0.5 mg by mouth 2 (two) times daily.      . fexofenadine (ALLEGRA) 60 MG tablet Take 1 tablet (60 mg total) by mouth daily.  30 tablet  3  . fluticasone (FLONASE) 50 MCG/ACT nasal spray Place 2 sprays into the nose daily.  16 g  3  . furosemide (LASIX) 20 MG tablet TAKE ONE TABLET BY MOUTH EVERY DAY  30 tablet  2  . ibuprofen (ADVIL,MOTRIN) 800 MG tablet Take 1 tablet (800 mg total) by mouth every 8 (eight) hours as needed for pain.  60 tablet  2  .  ondansetron (ZOFRAN-ODT) 8 MG disintegrating tablet Take 8 mg by mouth every 8 (eight) hours as needed.      . pantoprazole (PROTONIX) 40 MG tablet Take 40 mg by mouth 2 (two) times daily.       . polyethylene glycol (MIRALAX / GLYCOLAX) packet Take 17 g by mouth 2 (two) times daily.  60 each  3  . prazosin (MINIPRESS) 1 MG capsule Take 3 mg by mouth at bedtime.      . topiramate (TOPAMAX) 25 MG capsule Take 2 capsules (50 mg total) by mouth 2 (two) times daily.  120 capsule  3  . Vortioxetine HBr (BRINTELLIX) 10 MG TABS Take 10 mg by mouth daily.  30 tablet  6   No facility-administered encounter medications on file as of 03/22/2013.   BP 128/82  Pulse 97  Temp(Src) 98.4 F (36.9 C) (Oral)  Wt 193 lb (87.544 kg)  BMI 35.88 kg/m2  SpO2 97%  LMP 03/22/2013    Review of Systems  Constitutional: Negative for fever, chills, appetite change, fatigue and unexpected weight change.  HENT: Negative for ear pain, congestion, sore throat, trouble swallowing, neck pain, voice change and sinus pressure.   Eyes: Negative for visual disturbance.  Respiratory: Negative for cough, shortness of breath, wheezing and stridor.   Cardiovascular: Negative for chest pain, palpitations and  leg swelling.  Gastrointestinal: Negative for nausea, vomiting, abdominal pain, diarrhea, constipation, blood in stool, abdominal distention and anal bleeding.  Genitourinary: Negative for dysuria and flank pain.  Musculoskeletal: Negative for myalgias, arthralgias and gait problem.  Skin: Positive for color change and rash.  Neurological: Negative for dizziness and headaches.  Hematological: Negative for adenopathy. Does not bruise/bleed easily.  Psychiatric/Behavioral: Negative for suicidal ideas, sleep disturbance and dysphoric mood. The patient is not nervous/anxious.        Objective:   Physical Exam  Constitutional: She is oriented to person, place, and time. She appears well-developed and well-nourished. No  distress.  HENT:  Head: Normocephalic and atraumatic.  Right Ear: External ear normal.  Left Ear: External ear normal.  Nose: Nose normal.  Mouth/Throat: Oropharynx is clear and moist. No oropharyngeal exudate.  Eyes: Conjunctivae are normal. Pupils are equal, round, and reactive to light. Right eye exhibits no discharge. Left eye exhibits no discharge. No scleral icterus.  Neck: Normal range of motion. Neck supple. No tracheal deviation present. No thyromegaly present.  Cardiovascular: Normal rate, regular rhythm, normal heart sounds and intact distal pulses.  Exam reveals no gallop and no friction rub.   No murmur heard. Pulmonary/Chest: Effort normal and breath sounds normal. No respiratory distress. She has no wheezes. She has no rales. She exhibits no tenderness.  Musculoskeletal: Normal range of motion. She exhibits no edema and no tenderness.  Lymphadenopathy:    She has no cervical adenopathy.  Neurological: She is alert and oriented to person, place, and time. No cranial nerve deficit. She exhibits normal muscle tone. Coordination normal.  Skin: Skin is warm and dry. No rash noted. She is not diaphoretic. There is erythema (diffuse papules over chest and back, improved compared to previous). There is pallor.  Psychiatric: She has a normal mood and affect. Her behavior is normal. Judgment and thought content normal.          Assessment & Plan:

## 2013-03-22 NOTE — Assessment & Plan Note (Signed)
Diffuse papular rash, initially seemed most consistent with drug reaction, however rash has improved despite pt continuing on suspected agent, Brintellex. Question if rash may have been related to viral exanthem. Will set up dermatology evaluation.

## 2013-03-22 NOTE — Assessment & Plan Note (Signed)
Symptoms persistent despite use of gabapentin. Will increase dose to 600 mg 3 times daily. Followup in one month.

## 2013-03-22 NOTE — Assessment & Plan Note (Signed)
Potassium has improved on recent labs. Encouraged parents only to use furosemide as needed for significant swelling in the lower extremities, rather than on a daily basis.

## 2013-03-22 NOTE — Assessment & Plan Note (Signed)
Symptoms much improved with use of Brintellix. Will plan to continue. Will continue to follow with psychiatrist, Dr. Daleen Squibb.

## 2013-03-30 ENCOUNTER — Telehealth: Payer: Self-pay | Admitting: Internal Medicine

## 2013-03-30 NOTE — Telephone Encounter (Signed)
Spoke with patient father, he state for the last 3 days she has been having a lot of diarrhea and vomiting. The entire family has had this problem but they are all better. Asked [name] how many times did she have a BM yesterday and she stated about more than 3 times and has had a BM twice today. The pills she has been taking is not helping would like to know if there is anything that could be done.

## 2013-03-30 NOTE — Telephone Encounter (Signed)
Informed patient father and he verbalized understanding.  

## 2013-03-30 NOTE — Telephone Encounter (Signed)
Symptoms are most likely related to viral infection, given family members with similar symptoms. If no abdominal pain, fever, inability to keep down liquids, or blood in stool then I would recommend supportive care with increased fluid intake, rest. We can see her later this week if symptoms are persistent.

## 2013-03-30 NOTE — Telephone Encounter (Signed)
The patient's father is wanting to know is there anything else that could be called in for [name] . She has had diarrhea for 3 days.

## 2013-04-13 DIAGNOSIS — K5900 Constipation, unspecified: Secondary | ICD-10-CM | POA: Diagnosis not present

## 2013-04-13 DIAGNOSIS — R1013 Epigastric pain: Secondary | ICD-10-CM | POA: Diagnosis not present

## 2013-04-13 DIAGNOSIS — R112 Nausea with vomiting, unspecified: Secondary | ICD-10-CM | POA: Diagnosis not present

## 2013-04-19 ENCOUNTER — Ambulatory Visit (INDEPENDENT_AMBULATORY_CARE_PROVIDER_SITE_OTHER): Payer: Medicare Other | Admitting: Internal Medicine

## 2013-04-19 ENCOUNTER — Telehealth: Payer: Self-pay | Admitting: *Deleted

## 2013-04-19 ENCOUNTER — Encounter: Payer: Self-pay | Admitting: Internal Medicine

## 2013-04-19 VITALS — BP 112/78 | HR 85 | Temp 98.4°F | Wt 191.0 lb

## 2013-04-19 DIAGNOSIS — F329 Major depressive disorder, single episode, unspecified: Secondary | ICD-10-CM | POA: Diagnosis not present

## 2013-04-19 DIAGNOSIS — R110 Nausea: Secondary | ICD-10-CM | POA: Diagnosis not present

## 2013-04-19 DIAGNOSIS — R300 Dysuria: Secondary | ICD-10-CM | POA: Diagnosis not present

## 2013-04-19 DIAGNOSIS — N926 Irregular menstruation, unspecified: Secondary | ICD-10-CM | POA: Diagnosis not present

## 2013-04-19 DIAGNOSIS — F3289 Other specified depressive episodes: Secondary | ICD-10-CM | POA: Diagnosis not present

## 2013-04-19 DIAGNOSIS — E669 Obesity, unspecified: Secondary | ICD-10-CM

## 2013-04-19 LAB — POCT URINALYSIS DIPSTICK
Bilirubin, UA: NEGATIVE
Glucose, UA: NEGATIVE
Ketones, UA: NEGATIVE
Spec Grav, UA: 1.02

## 2013-04-19 MED ORDER — CIPROFLOXACIN HCL 500 MG PO TABS: 500.0000 mg | ORAL_TABLET | Freq: Two times a day (BID) | ORAL | Status: DC

## 2013-04-19 NOTE — Assessment & Plan Note (Signed)
Wt Readings from Last 3 Encounters:  04/19/13 191 lb (86.637 kg)  03/22/13 193 lb (87.544 kg)  02/22/13 194 lb (87.998 kg)   Congratulated pt on weight loss. Encouraged her to continue efforts at healthy diet and exercise.

## 2013-04-19 NOTE — Telephone Encounter (Signed)
Message copied by Theola Sequin on Tue Apr 19, 2013  4:50 PM ------      Message from: Ronna Polio A      Created: Tue Apr 19, 2013 10:45 AM       Can we please send urine for culture and call in Cipro 500mg  po bid x 7 days. Can you let pt know urine appears to be infected ------

## 2013-04-19 NOTE — Assessment & Plan Note (Signed)
Symptoms improved with ondansetron. Will try to get records on recent evaluation including gastric emptying study from [facility].

## 2013-04-19 NOTE — Assessment & Plan Note (Signed)
Symptoms of burning with urination. Urinalysis pos for blood and leuk. Will send for culture and treat with Cipro. Follow up if symptoms are not improving.

## 2013-04-19 NOTE — Assessment & Plan Note (Signed)
Symptoms resolved after recent endometrial ablation. Will continue to monitor.

## 2013-04-19 NOTE — Assessment & Plan Note (Signed)
Symptoms well controlled on current medications. Will continue. 

## 2013-04-19 NOTE — Progress Notes (Signed)
Subjective:    Patient ID: Jamie Ross, female    DOB: 01/02/1986, 27 y.o.   MRN: [id number]  HPI 27 year old female with history of developmental delay, depression, chronic pain, chronic nausea presents for followup. She reports she is generally feeling well. Symptoms of depression have been well-controlled on current medication. She has been more physically active and eating a healthier diet. She has lost 2 pounds since her last visit. Symptoms of irregular menses have stopped after endometrial ablation performed last month.  Symptoms of nausea have improved with use of Zofran. She notes she was recently seen by GI physician and had additional testing including gastric emptying study which was reportedly abnormal with slow to emptying. She has followup with GI physician scheduled.  Only concern today is several day history of burning with urination. She denies any urinary frequency, urgency, fever, chills, flank pain.  Outpatient Encounter Prescriptions as of 04/19/2013  Medication Sig Dispense Refill  . albuterol (PROVENTIL HFA;VENTOLIN HFA) 108 (90 BASE) MCG/ACT inhaler Inhale 2 puffs into the lungs every 6 (six) hours as needed.      Marland Kitchen albuterol (PROVENTIL) (2.5 MG/3ML) 0.083% nebulizer solution Take 2.5 mg by nebulization every 6 (six) hours as needed.      . chlorproMAZINE (THORAZINE) 25 MG tablet Take 1 tablet (25 mg total) by mouth as directed. 2 tab in AM and 4 tabs at bedtime  180 tablet  3  . clonazePAM (KLONOPIN) 0.5 MG tablet Take 0.5 mg by mouth 2 (two) times daily.      . fexofenadine (ALLEGRA) 60 MG tablet Take 1 tablet (60 mg total) by mouth daily.  30 tablet  3  . fluticasone (FLONASE) 50 MCG/ACT nasal spray Place 2 sprays into the nose daily.  16 g  3  . furosemide (LASIX) 20 MG tablet TAKE ONE TABLET BY MOUTH EVERY DAY  30 tablet  2  . gabapentin (NEURONTIN) 600 MG tablet Take 1 tablet (600 mg total) by mouth 3 (three) times daily.  90 tablet  3  .  HYDROcodone-acetaminophen (NORCO/VICODIN) 5-325 MG per tablet       . ibuprofen (ADVIL,MOTRIN) 800 MG tablet Take 1 tablet (800 mg total) by mouth every 8 (eight) hours as needed for pain.  60 tablet  2  . ondansetron (ZOFRAN-ODT) 8 MG disintegrating tablet Take 8 mg by mouth every 8 (eight) hours as needed.      . polyethylene glycol (MIRALAX / GLYCOLAX) packet Take 17 g by mouth 2 (two) times daily.  60 each  3  . prazosin (MINIPRESS) 1 MG capsule Take 3 mg by mouth at bedtime.      . RABEprazole (ACIPHEX) 20 MG tablet       . topiramate (TOPAMAX) 25 MG capsule Take 2 capsules (50 mg total) by mouth 2 (two) times daily.  120 capsule  3  . Vortioxetine HBr (BRINTELLIX) 10 MG TABS Take 10 mg by mouth daily.  30 tablet  6  . pantoprazole (PROTONIX) 40 MG tablet Take 40 mg by mouth 2 (two) times daily.       . traMADol (ULTRAM) 50 MG tablet        No facility-administered encounter medications on file as of 04/19/2013.   BP 112/78  Pulse 85  Temp(Src) 98.4 F (36.9 C) (Oral)  Wt 191 lb (86.637 kg)  BMI 35.51 kg/m2  SpO2 99%  LMP 03/22/2013  Review of Systems  Constitutional: Negative for fever, chills, appetite change, fatigue and unexpected weight change.  HENT: Negative for ear pain, congestion, sore throat, trouble swallowing, neck pain, voice change and sinus pressure.   Eyes: Negative for visual disturbance.  Respiratory: Negative for cough, shortness of breath, wheezing and stridor.   Cardiovascular: Negative for chest pain, palpitations and leg swelling.  Gastrointestinal: Negative for nausea, vomiting, abdominal pain, diarrhea, constipation, blood in stool, abdominal distention and anal bleeding.  Genitourinary: Positive for dysuria. Negative for urgency, frequency, hematuria, flank pain, decreased urine volume and pelvic pain.  Musculoskeletal: Negative for myalgias, arthralgias and gait problem.  Skin: Negative for color change and rash.  Neurological: Negative for dizziness  and headaches.  Hematological: Negative for adenopathy. Does not bruise/bleed easily.  Psychiatric/Behavioral: Negative for suicidal ideas, sleep disturbance and dysphoric mood. The patient is not nervous/anxious.        Objective:   Physical Exam  Constitutional: She is oriented to person, place, and time. She appears well-developed and well-nourished. No distress.  HENT:  Head: Normocephalic and atraumatic.  Right Ear: External ear normal.  Left Ear: External ear normal.  Nose: Nose normal.  Mouth/Throat: Oropharynx is clear and moist. No oropharyngeal exudate.  Eyes: Conjunctivae are normal. Pupils are equal, round, and reactive to light. Right eye exhibits no discharge. Left eye exhibits no discharge. No scleral icterus.  Neck: Normal range of motion. Neck supple. No tracheal deviation present. No thyromegaly present.  Cardiovascular: Normal rate, regular rhythm, normal heart sounds and intact distal pulses.  Exam reveals no gallop and no friction rub.   No murmur heard. Pulmonary/Chest: Effort normal and breath sounds normal. No accessory muscle usage. Not tachypneic. No respiratory distress. She has no decreased breath sounds. She has no wheezes. She has no rhonchi. She has no rales. She exhibits no tenderness.  Abdominal: There is no tenderness.  Musculoskeletal: Normal range of motion. She exhibits no edema and no tenderness.  Lymphadenopathy:    She has no cervical adenopathy.  Neurological: She is alert and oriented to person, place, and time. No cranial nerve deficit. She exhibits normal muscle tone. Coordination normal.  Skin: Skin is warm and dry. No rash noted. She is not diaphoretic. No erythema. No pallor.  Psychiatric: She has a normal mood and affect. Her behavior is normal. Judgment and thought content normal.          Assessment & Plan:

## 2013-04-21 LAB — URINE CULTURE

## 2013-04-25 ENCOUNTER — Other Ambulatory Visit (INDEPENDENT_AMBULATORY_CARE_PROVIDER_SITE_OTHER): Payer: Medicare Other

## 2013-04-25 DIAGNOSIS — N390 Urinary tract infection, site not specified: Secondary | ICD-10-CM | POA: Diagnosis not present

## 2013-04-25 DIAGNOSIS — F339 Major depressive disorder, recurrent, unspecified: Secondary | ICD-10-CM | POA: Diagnosis not present

## 2013-04-25 DIAGNOSIS — F4310 Post-traumatic stress disorder, unspecified: Secondary | ICD-10-CM | POA: Diagnosis not present

## 2013-04-25 DIAGNOSIS — F70 Mild intellectual disabilities: Secondary | ICD-10-CM | POA: Diagnosis not present

## 2013-04-25 LAB — POCT URINALYSIS DIPSTICK
Nitrite, UA: NEGATIVE
Protein, UA: 100
Urobilinogen, UA: 0.2

## 2013-04-26 LAB — URINE CULTURE
Colony Count: NO GROWTH
Organism ID, Bacteria: NO GROWTH

## 2013-04-27 ENCOUNTER — Other Ambulatory Visit: Payer: Self-pay | Admitting: Internal Medicine

## 2013-04-27 DIAGNOSIS — R319 Hematuria, unspecified: Secondary | ICD-10-CM

## 2013-05-11 DIAGNOSIS — R300 Dysuria: Secondary | ICD-10-CM | POA: Diagnosis not present

## 2013-05-11 DIAGNOSIS — R3129 Other microscopic hematuria: Secondary | ICD-10-CM | POA: Diagnosis not present

## 2013-05-11 DIAGNOSIS — R3915 Urgency of urination: Secondary | ICD-10-CM | POA: Diagnosis not present

## 2013-05-16 DIAGNOSIS — R9389 Abnormal findings on diagnostic imaging of other specified body structures: Secondary | ICD-10-CM | POA: Diagnosis not present

## 2013-05-16 DIAGNOSIS — M545 Low back pain, unspecified: Secondary | ICD-10-CM | POA: Diagnosis not present

## 2013-05-16 DIAGNOSIS — R3129 Other microscopic hematuria: Secondary | ICD-10-CM | POA: Diagnosis not present

## 2013-05-20 ENCOUNTER — Telehealth: Payer: Self-pay | Admitting: Internal Medicine

## 2013-05-20 NOTE — Telephone Encounter (Signed)
Wanting results from CT scan done at [facility] neurology.

## 2013-05-20 NOTE — Telephone Encounter (Signed)
I have not received these. They should call [facility] Neurology

## 2013-05-20 NOTE — Telephone Encounter (Signed)
Informed patient father Jamie Ross

## 2013-05-20 NOTE — Telephone Encounter (Signed)
Do you have those results?

## 2013-05-27 ENCOUNTER — Telehealth: Payer: Self-pay | Admitting: Internal Medicine

## 2013-05-27 NOTE — Telephone Encounter (Signed)
Have you seen these results?

## 2013-05-27 NOTE — Telephone Encounter (Signed)
I have not received these results. They should contact the physician who ordered this test.

## 2013-05-27 NOTE — Telephone Encounter (Signed)
Dad called wanting to get results of ct of her abd. This was done about 2 weeks ago @ [facility] [place]  [phone]

## 2013-05-30 ENCOUNTER — Telehealth: Payer: Self-pay | Admitting: Internal Medicine

## 2013-05-30 MED ORDER — ALBUTEROL SULFATE HFA 108 (90 BASE) MCG/ACT IN AERS: 2.0000 | INHALATION_SPRAY | Freq: Four times a day (QID) | RESPIRATORY_TRACT | Status: DC | PRN

## 2013-05-30 NOTE — Telephone Encounter (Signed)
Spoke with patient father Harvie Heck, he stated he called because [name] need a refill on her Albuterol inhaler. Informed him to call the [facility] office for results and I faxed refill for Albuterol to pharmacy on file.

## 2013-05-30 NOTE — Telephone Encounter (Signed)
This was sent to [facility] in [place] today

## 2013-05-30 NOTE — Telephone Encounter (Signed)
Pt dad called wanting to get a refill ventolin albuterol sulfate inhaler [facility] [address]

## 2013-06-01 DIAGNOSIS — N926 Irregular menstruation, unspecified: Secondary | ICD-10-CM | POA: Diagnosis not present

## 2013-06-01 DIAGNOSIS — N946 Dysmenorrhea, unspecified: Secondary | ICD-10-CM | POA: Diagnosis not present

## 2013-06-06 ENCOUNTER — Telehealth: Payer: Self-pay | Admitting: *Deleted

## 2013-06-06 NOTE — Telephone Encounter (Signed)
Patient father Harvie Heck left a voicemail stating Medicaid will not pay for the Albuterol, would like to know is there anything else she could be switched to that Medicaid would pay for?

## 2013-06-06 NOTE — Telephone Encounter (Signed)
Spoke with Jamie Ross, informed him of what Dr. Dan Humphreys stated and he just confused why all of sudden he has to pay for this. He will call [facility] to check on this.

## 2013-06-06 NOTE — Telephone Encounter (Signed)
No. Albuterol would be the least expensive alternative.

## 2013-06-20 DIAGNOSIS — F339 Major depressive disorder, recurrent, unspecified: Secondary | ICD-10-CM | POA: Diagnosis not present

## 2013-06-20 DIAGNOSIS — F70 Mild intellectual disabilities: Secondary | ICD-10-CM | POA: Diagnosis not present

## 2013-06-20 DIAGNOSIS — F4310 Post-traumatic stress disorder, unspecified: Secondary | ICD-10-CM | POA: Diagnosis not present

## 2013-06-20 DIAGNOSIS — R350 Frequency of micturition: Secondary | ICD-10-CM | POA: Diagnosis not present

## 2013-06-20 DIAGNOSIS — R3129 Other microscopic hematuria: Secondary | ICD-10-CM | POA: Diagnosis not present

## 2013-06-24 DIAGNOSIS — H6690 Otitis media, unspecified, unspecified ear: Secondary | ICD-10-CM | POA: Diagnosis not present

## 2013-06-28 ENCOUNTER — Telehealth: Payer: Self-pay | Admitting: Internal Medicine

## 2013-06-28 NOTE — Telephone Encounter (Signed)
Having some problems with hear ears and dad is asking if you could call her in some ear drops.

## 2013-06-28 NOTE — Telephone Encounter (Signed)
Left message to call bck.

## 2013-06-29 ENCOUNTER — Telehealth: Payer: Self-pay | Admitting: Internal Medicine

## 2013-06-29 NOTE — Telephone Encounter (Signed)
LMTCB

## 2013-06-29 NOTE — Telephone Encounter (Signed)
Patient returning your call . [name] needing ear drops. The father wanted you to know that they were at home.

## 2013-06-29 NOTE — Telephone Encounter (Signed)
Jamie Ross (dad) returned call.  Is asking again that ear drops be called in

## 2013-06-30 NOTE — Telephone Encounter (Signed)
Would like something for her ears, been hurting down inside but no fever. Ears are so sensitive and sometimes they has to put something in it. Had some ear drops at home but they have used them all. If decide to call something in, send it to the [facility] [address].

## 2013-06-30 NOTE — Telephone Encounter (Signed)
Left message that patient will need to be seen, call the office back to schedule an appointment.

## 2013-06-30 NOTE — Telephone Encounter (Signed)
Please refer to previous encounter for further details.

## 2013-06-30 NOTE — Telephone Encounter (Signed)
Needs to be seen

## 2013-07-16 ENCOUNTER — Other Ambulatory Visit: Payer: Self-pay | Admitting: Internal Medicine

## 2013-07-18 NOTE — Telephone Encounter (Signed)
Eprescribed.

## 2013-07-20 ENCOUNTER — Encounter: Payer: Self-pay | Admitting: Internal Medicine

## 2013-07-20 ENCOUNTER — Ambulatory Visit (INDEPENDENT_AMBULATORY_CARE_PROVIDER_SITE_OTHER): Payer: Medicare Other | Admitting: Internal Medicine

## 2013-07-20 VITALS — BP 120/90 | HR 83 | Temp 97.7°F | Wt 193.0 lb

## 2013-07-20 DIAGNOSIS — J309 Allergic rhinitis, unspecified: Secondary | ICD-10-CM | POA: Diagnosis not present

## 2013-07-20 DIAGNOSIS — L708 Other acne: Secondary | ICD-10-CM | POA: Diagnosis not present

## 2013-07-20 DIAGNOSIS — E785 Hyperlipidemia, unspecified: Secondary | ICD-10-CM | POA: Diagnosis not present

## 2013-07-20 DIAGNOSIS — Z0000 Encounter for general adult medical examination without abnormal findings: Secondary | ICD-10-CM | POA: Diagnosis not present

## 2013-07-20 DIAGNOSIS — K219 Gastro-esophageal reflux disease without esophagitis: Secondary | ICD-10-CM | POA: Diagnosis not present

## 2013-07-20 DIAGNOSIS — D239 Other benign neoplasm of skin, unspecified: Secondary | ICD-10-CM | POA: Diagnosis not present

## 2013-07-20 DIAGNOSIS — L709 Acne, unspecified: Secondary | ICD-10-CM

## 2013-07-20 DIAGNOSIS — D229 Melanocytic nevi, unspecified: Secondary | ICD-10-CM

## 2013-07-20 DIAGNOSIS — J302 Other seasonal allergic rhinitis: Secondary | ICD-10-CM

## 2013-07-20 LAB — LIPID PANEL
HDL: 38 mg/dL — ABNORMAL LOW (ref >39.00)
LDL Cholesterol: 89 mg/dL (ref 0–99)
Total CHOL/HDL Ratio: 4
Triglycerides: 123 mg/dL (ref 0.0–149.0)

## 2013-07-20 LAB — COMPREHENSIVE METABOLIC PANEL
AST: 15 U/L (ref 0–37)
Alkaline Phosphatase: 93 U/L (ref 39–117)
BUN: 13 mg/dL (ref 6–23)
Calcium: 8.9 mg/dL (ref 8.4–10.5)
Creatinine, Ser: 0.9 mg/dL (ref 0.4–1.2)
Total Bilirubin: 0.4 mg/dL (ref 0.3–1.2)

## 2013-07-20 MED ORDER — CLONAZEPAM 0.5 MG PO TABS: 0.5000 mg | ORAL_TABLET | Freq: Two times a day (BID) | ORAL | Status: DC

## 2013-07-20 MED ORDER — METRONIDAZOLE 0.75 % EX GEL: Freq: Two times a day (BID) | CUTANEOUS | Status: DC

## 2013-07-20 MED ORDER — PANTOPRAZOLE SODIUM 40 MG PO TBEC: 40.0000 mg | DELAYED_RELEASE_TABLET | Freq: Two times a day (BID) | ORAL | Status: DC

## 2013-07-20 NOTE — Assessment & Plan Note (Signed)
Exam with diffuse acne over face and trunk. Recommended starting Metrogel bid. Will also set up dermatology evaluation.

## 2013-07-20 NOTE — Assessment & Plan Note (Signed)
Symptoms worsening after stopping Pantoprazole. Unclear why this medication was stopped. Will resume the medication. Pt will call if symptoms are not improving.

## 2013-07-20 NOTE — Progress Notes (Signed)
Subjective:    Patient ID: Jamie Ross, female    DOB: [birth year], 27 y.o.   MRN: [id number]  HPI 27 year old female with history of developmental delay, depression, chronic peripheral neuropathy pain, asthma presents for followup. She reports that she is generally been doing well. She notes ongoing issues with seasonal allergies with clear nasal drainage, nasal congestion, and occasional cough productive of yellow or greenish sputum. She has not had any fever or chills. She has been taking Allegra and using Flonase with minimal improvement. Referral to allergy/immunology had been set up in the past but she reports that she was not made aware of appointment time.  She is also concerned today about a on her right upper back. Her mother noticed this. She is unsure when they first appeared. It is not tender.  She is also concerned about persistent acne over her face and chest. She is not currently taking any medication for this. She is not using any topical treatments.  She reports she was instructed by a physician to stop her pantoprazole. Ever since stopping this medication she has developed worsening symptoms of acid reflux with abdominal pain, substernal burning pain. She would like to resume this medication.  Outpatient Encounter Prescriptions as of 07/20/2013  Medication Sig Dispense Refill  . albuterol (PROVENTIL HFA;VENTOLIN HFA) 108 (90 BASE) MCG/ACT inhaler Inhale 2 puffs into the lungs every 6 (six) hours as needed.  1 Inhaler  3  . albuterol (PROVENTIL) (2.5 MG/3ML) 0.083% nebulizer solution Take 2.5 mg by nebulization every 6 (six) hours as needed.      . chlorproMAZINE (THORAZINE) 25 MG tablet Take 1 tablet (25 mg total) by mouth as directed. 2 tab in AM and 4 tabs at bedtime  180 tablet  3  . clonazePAM (KLONOPIN) 0.5 MG tablet Take 1 tablet (0.5 mg total) by mouth 2 (two) times daily.  60 tablet  3  . fexofenadine (ALLEGRA) 60 MG tablet Take 1 tablet (60 mg total) by mouth  daily.  30 tablet  3  . fluticasone (FLONASE) 50 MCG/ACT nasal spray Place 2 sprays into the nose daily.  16 g  3  . gabapentin (NEURONTIN) 600 MG tablet TAKE ONE TABLET BY MOUTH THREE TIMES DAILY  90 tablet  0  . ibuprofen (ADVIL,MOTRIN) 800 MG tablet Take 1 tablet (800 mg total) by mouth every 8 (eight) hours as needed for pain.  60 tablet  2  . ondansetron (ZOFRAN-ODT) 8 MG disintegrating tablet Take 8 mg by mouth every 8 (eight) hours as needed.      . polyethylene glycol (MIRALAX / GLYCOLAX) packet Take 17 g by mouth 2 (two) times daily.  60 each  3  . prazosin (MINIPRESS) 1 MG capsule Take 3 mg by mouth at bedtime.      . topiramate (TOPAMAX) 25 MG capsule Take 2 capsules (50 mg total) by mouth 2 (two) times daily.  120 capsule  3  . Vortioxetine HBr (BRINTELLIX) 10 MG TABS Take 10 mg by mouth daily.  30 tablet  6  . [DISCONTINUED] clonazePAM (KLONOPIN) 0.5 MG tablet Take 0.5 mg by mouth 2 (two) times daily.      . furosemide (LASIX) 20 MG tablet TAKE ONE TABLET BY MOUTH EVERY DAY  30 tablet  2  . HYDROcodone-acetaminophen (NORCO/VICODIN) 5-325 MG per tablet       . metroNIDAZOLE (METROGEL) 0.75 % gel Apply topically 2 (two) times daily.  45 g  0  . pantoprazole (PROTONIX) 40 MG tablet  Take 1 tablet (40 mg total) by mouth 2 (two) times daily.  60 tablet  6  . RABEprazole (ACIPHEX) 20 MG tablet       . traMADol (ULTRAM) 50 MG tablet        No facility-administered encounter medications on file as of 07/20/2013.   BP 120/90  Pulse 83  Temp(Src) 97.7 F (36.5 C) (Oral)  Wt 193 lb (87.544 kg)  BMI 35.88 kg/m2  SpO2 95%  Review of Systems  Constitutional: Negative for fever, chills, diaphoresis, activity change, appetite change, fatigue and unexpected weight change.  HENT: Positive for congestion, rhinorrhea and postnasal drip. Negative for hearing loss, ear pain, nosebleeds, sore throat, facial swelling, sneezing, drooling, mouth sores, trouble swallowing, neck pain, neck stiffness,  dental problem, voice change, sinus pressure, tinnitus and ear discharge.   Respiratory: Negative for cough and shortness of breath.   Cardiovascular: Negative for chest pain and leg swelling.  Gastrointestinal: Negative for nausea, abdominal pain, diarrhea, constipation and abdominal distention.  Genitourinary: Negative for dysuria, urgency, frequency, hematuria, flank pain, difficulty urinating and dyspareunia.  Musculoskeletal: Negative for myalgias, back pain, arthralgias and gait problem.  Skin: Negative for color change, rash and wound.  Hematological: Negative for adenopathy. Does not bruise/bleed easily.  Psychiatric/Behavioral: Negative for suicidal ideas, hallucinations, sleep disturbance, dysphoric mood and decreased concentration. The patient is not nervous/anxious.        Objective:   Physical Exam  Constitutional: She is oriented to person, place, and time. She appears well-developed and well-nourished. No distress.  HENT:  Head: Normocephalic and atraumatic.  Right Ear: Tympanic membrane and external ear normal.  Left Ear: Tympanic membrane and external ear normal.  Nose: Nose normal.  Mouth/Throat: Oropharynx is clear and moist. No oropharyngeal exudate.  Eyes: Conjunctivae are normal. Pupils are equal, round, and reactive to light. Right eye exhibits no discharge. Left eye exhibits no discharge. No scleral icterus.  Neck: Normal range of motion. Neck supple. No tracheal deviation present. No thyromegaly present.  Cardiovascular: Normal rate, regular rhythm, normal heart sounds and intact distal pulses.  Exam reveals no gallop and no friction rub.   No murmur heard. Pulmonary/Chest: Effort normal and breath sounds normal. No accessory muscle usage. Not tachypneic. No respiratory distress. She has no decreased breath sounds. She has no wheezes. She has no rhonchi. She has no rales. She exhibits no tenderness.  Musculoskeletal: Normal range of motion. She exhibits no edema and  no tenderness.  Lymphadenopathy:    She has no cervical adenopathy.  Neurological: She is alert and oriented to person, place, and time. No cranial nerve deficit. She exhibits normal muscle tone. Coordination normal.  Skin: Skin is warm and dry. Rash noted. Rash is pustular (over face, neck and trunk consistent with acne). She is not diaphoretic. No erythema. No pallor.     Psychiatric: She has a normal mood and affect. Her behavior is normal. Judgment and thought content normal.          Assessment & Plan:

## 2013-07-20 NOTE — Assessment & Plan Note (Signed)
Small dark brown nevus right upper back, approx 2mm diameter. Pt mother reports this is new. Will set up dermatology evaluation for biopsy.

## 2013-07-20 NOTE — Assessment & Plan Note (Addendum)
Persistent symptoms of nasal drainage and sneezing, despite use of Allegra and Flonase. Will set up allergy evaluation.

## 2013-07-21 ENCOUNTER — Encounter: Payer: Self-pay | Admitting: *Deleted

## 2013-07-22 ENCOUNTER — Other Ambulatory Visit: Payer: Self-pay | Admitting: *Deleted

## 2013-07-22 DIAGNOSIS — Z79899 Other long term (current) drug therapy: Secondary | ICD-10-CM | POA: Diagnosis not present

## 2013-07-26 DIAGNOSIS — R21 Rash and other nonspecific skin eruption: Secondary | ICD-10-CM | POA: Diagnosis not present

## 2013-07-26 DIAGNOSIS — J45909 Unspecified asthma, uncomplicated: Secondary | ICD-10-CM | POA: Diagnosis not present

## 2013-07-26 DIAGNOSIS — J309 Allergic rhinitis, unspecified: Secondary | ICD-10-CM | POA: Diagnosis not present

## 2013-08-01 ENCOUNTER — Encounter: Payer: Self-pay | Admitting: Internal Medicine

## 2013-08-02 ENCOUNTER — Other Ambulatory Visit: Payer: Self-pay | Admitting: Internal Medicine

## 2013-08-09 ENCOUNTER — Telehealth: Payer: Self-pay | Admitting: *Deleted

## 2013-08-09 NOTE — Telephone Encounter (Signed)
Father Harvie Heck called, would like to know what else he could give [name] for headaches/

## 2013-08-10 NOTE — Telephone Encounter (Signed)
Informed patient father and he verbalized understanding.  

## 2013-08-10 NOTE — Telephone Encounter (Signed)
Tylenol 1000mg  up to twice daily would be the safest. She is followed by a neurologist for headaches, so may also want to discuss with them

## 2013-08-15 DIAGNOSIS — F4310 Post-traumatic stress disorder, unspecified: Secondary | ICD-10-CM | POA: Diagnosis not present

## 2013-08-15 DIAGNOSIS — F70 Mild intellectual disabilities: Secondary | ICD-10-CM | POA: Diagnosis not present

## 2013-08-15 DIAGNOSIS — F339 Major depressive disorder, recurrent, unspecified: Secondary | ICD-10-CM | POA: Diagnosis not present

## 2013-08-24 ENCOUNTER — Telehealth: Payer: Self-pay | Admitting: *Deleted

## 2013-08-24 NOTE — Telephone Encounter (Signed)
Left message to call back  

## 2013-08-24 NOTE — Telephone Encounter (Signed)
Patient father left a message about patient indigestion. Spoke with patient father Jamie Ross, per father she has been having a lot more indigestion and the medication prescribed to her is not working. No matter what she eat, she is having indigestion and they are giving her Tums and Rolaids. Would like to know if there is anything else she could be given or could they increase the dose of her current medication? She is currently taking Protonix 40 mg BID

## 2013-08-24 NOTE — Telephone Encounter (Signed)
No. Protonix 40mg  bid would be max dose. She should follow up with Dr. Marva Panda as he may want to repeat endoscopy with persistent symptoms

## 2013-08-24 NOTE — Telephone Encounter (Signed)
Informed patient father and he verbalized understanding.  

## 2013-09-16 DIAGNOSIS — Z23 Encounter for immunization: Secondary | ICD-10-CM | POA: Diagnosis not present

## 2013-09-19 ENCOUNTER — Other Ambulatory Visit: Payer: Self-pay | Admitting: *Deleted

## 2013-09-20 DIAGNOSIS — L723 Sebaceous cyst: Secondary | ICD-10-CM | POA: Diagnosis not present

## 2013-09-20 DIAGNOSIS — L905 Scar conditions and fibrosis of skin: Secondary | ICD-10-CM | POA: Diagnosis not present

## 2013-09-20 DIAGNOSIS — L708 Other acne: Secondary | ICD-10-CM | POA: Diagnosis not present

## 2013-09-20 MED ORDER — GABAPENTIN 600 MG PO TABS: ORAL_TABLET | ORAL | Status: DC

## 2013-09-20 NOTE — Telephone Encounter (Signed)
Eprescribed.

## 2013-09-22 ENCOUNTER — Other Ambulatory Visit: Payer: Self-pay | Admitting: *Deleted

## 2013-09-22 MED ORDER — GABAPENTIN 600 MG PO TABS: ORAL_TABLET | ORAL | Status: DC

## 2013-09-23 DIAGNOSIS — IMO0001 Reserved for inherently not codable concepts without codable children: Secondary | ICD-10-CM | POA: Diagnosis not present

## 2013-09-23 DIAGNOSIS — R05 Cough: Secondary | ICD-10-CM | POA: Diagnosis not present

## 2013-09-23 DIAGNOSIS — J029 Acute pharyngitis, unspecified: Secondary | ICD-10-CM | POA: Diagnosis not present

## 2013-09-23 DIAGNOSIS — J069 Acute upper respiratory infection, unspecified: Secondary | ICD-10-CM | POA: Diagnosis not present

## 2013-09-23 DIAGNOSIS — R059 Cough, unspecified: Secondary | ICD-10-CM | POA: Diagnosis not present

## 2013-10-05 ENCOUNTER — Telehealth: Payer: Self-pay | Admitting: Internal Medicine

## 2013-10-05 ENCOUNTER — Telehealth: Payer: Self-pay | Admitting: *Deleted

## 2013-10-05 MED ORDER — POLYETHYLENE GLYCOL 3350 17 GM/SCOOP PO POWD: ORAL | Status: DC

## 2013-10-05 NOTE — Telephone Encounter (Signed)
Spoke with Jamie Ross informed him that her medications has refills on them so all he would need to do is have them transferred to new pharmacy. No refills on the Miralax so prescription sen to [facility] pharmacy

## 2013-10-05 NOTE — Telephone Encounter (Signed)
States they switched pharmacy to [facility]. States pharmacy has faxed [place] but has not gotten response so he was told to contact us.  Pt is out of Miralax.  Please contact [facility] [phone] regarding all prescriptions.  Please contact pt when complete so they can pick up Miralax.

## 2013-10-05 NOTE — Telephone Encounter (Signed)
Refill Request  Miralax

## 2013-10-14 ENCOUNTER — Encounter: Payer: Self-pay | Admitting: Adult Health

## 2013-10-14 ENCOUNTER — Ambulatory Visit (INDEPENDENT_AMBULATORY_CARE_PROVIDER_SITE_OTHER): Payer: Medicare Other | Admitting: Adult Health

## 2013-10-14 VITALS — BP 102/68 | HR 87 | Temp 97.8°F | Resp 12 | Wt 198.0 lb

## 2013-10-14 DIAGNOSIS — R21 Rash and other nonspecific skin eruption: Secondary | ICD-10-CM | POA: Diagnosis not present

## 2013-10-14 DIAGNOSIS — L259 Unspecified contact dermatitis, unspecified cause: Secondary | ICD-10-CM | POA: Diagnosis not present

## 2013-10-14 DIAGNOSIS — R110 Nausea: Secondary | ICD-10-CM | POA: Diagnosis not present

## 2013-10-14 DIAGNOSIS — N390 Urinary tract infection, site not specified: Secondary | ICD-10-CM | POA: Diagnosis not present

## 2013-10-14 DIAGNOSIS — L309 Dermatitis, unspecified: Secondary | ICD-10-CM

## 2013-10-14 LAB — CBC WITH DIFFERENTIAL/PLATELET
Basophils Absolute: 0 10*3/uL (ref 0.0–0.1)
Eosinophils Absolute: 0.2 10*3/uL (ref 0.0–0.7)
Eosinophils Relative: 2 % (ref 0–5)
HCT: 40.7 % (ref 36.0–46.0)
Lymphs Abs: 3.5 10*3/uL (ref 0.7–4.0)
MCH: 32.2 pg (ref 26.0–34.0)
MCV: 95.1 fL (ref 78.0–100.0)
Monocytes Absolute: 0.7 10*3/uL (ref 0.1–1.0)
Neutrophils Relative %: 56 % (ref 43–77)
Platelets: 330 10*3/uL (ref 150–400)
RBC: 4.28 MIL/uL (ref 3.87–5.11)
RDW: 14.1 % (ref 11.5–15.5)

## 2013-10-14 LAB — POCT URINALYSIS DIPSTICK
Bilirubin, UA: NEGATIVE
Blood, UA: NEGATIVE
Leukocytes, UA: NEGATIVE
Nitrite, UA: NEGATIVE
Protein, UA: NEGATIVE
Urobilinogen, UA: 0.2
pH, UA: 7

## 2013-10-14 NOTE — Progress Notes (Signed)
Pre-visit discussion using our clinic review tool. No additional management support is needed unless otherwise documented below in the visit note.  

## 2013-10-14 NOTE — Patient Instructions (Signed)
  Try Neutrogena shampoo  Ivory soap for her skin  Try calamine lotion for the area with the rash.  Benadryl 25 mg every 8 hours as needed for itching.

## 2013-10-16 NOTE — Progress Notes (Signed)
Subjective:    Patient ID: Jamie Ross, female    DOB: 02-May-1986, 27 y.o.   MRN: [id number]  HPI Patient is a very pleasant, developmentally challenged 27 y/o female who presents to clinic with her parents. Pt is having some episodes of vomiting what the mother describes as bile. She has had an ongoing problem with this 2/2 hiatal hernia and delayed emptying. She is followed by GI for this. Patient takes zofran as needed. She also presents with a rash on her anterior chest and back area. Mother denies any new medications, soaps, lotions, detergents, etc. The rash is itchy. She has not taken anything for the rash. Patient with hx of dermatitis and also acne. She was started, at one point, on doxycyclin but reacted to this medication. She denies fever, chills or any other symptoms associated with the rash.  Mom is requesting that we check a UA to make sure she does not have a UTI; although patient reports no symptoms.   Current Outpatient Prescriptions on File Prior to Visit  Medication Sig Dispense Refill  . albuterol (PROVENTIL HFA;VENTOLIN HFA) 108 (90 BASE) MCG/ACT inhaler Inhale 2 puffs into the lungs every 6 (six) hours as needed.  1 Inhaler  3  . albuterol (PROVENTIL) (2.5 MG/3ML) 0.083% nebulizer solution Take 2.5 mg by nebulization every 6 (six) hours as needed.      . chlorproMAZINE (THORAZINE) 25 MG tablet Take 1 tablet (25 mg total) by mouth as directed. 2 tab in AM and 4 tabs at bedtime  180 tablet  3  . clonazePAM (KLONOPIN) 0.5 MG tablet Take 1 tablet (0.5 mg total) by mouth 2 (two) times daily.  60 tablet  3  . fexofenadine (ALLEGRA) 60 MG tablet Take 1 tablet (60 mg total) by mouth daily.  30 tablet  3  . fluticasone (FLONASE) 50 MCG/ACT nasal spray Place 2 sprays into the nose daily.  16 g  3  . gabapentin (NEURONTIN) 600 MG tablet TAKE ONE TABLET BY MOUTH THREE TIMES DAILY  90 tablet  1  . ibuprofen (ADVIL,MOTRIN) 800 MG tablet Take 1 tablet (800 mg total) by mouth every  8 (eight) hours as needed for pain.  60 tablet  2  . metroNIDAZOLE (METROGEL) 0.75 % gel Apply topically 2 (two) times daily.  45 g  0  . ondansetron (ZOFRAN-ODT) 8 MG disintegrating tablet Take 8 mg by mouth every 8 (eight) hours as needed.      . pantoprazole (PROTONIX) 40 MG tablet Take 1 tablet (40 mg total) by mouth 2 (two) times daily.  60 tablet  6  . polyethylene glycol powder (GLYCOLAX/MIRALAX) powder MIX 17 GRAMS IN LIQUID AND DRINK AS DIRECTED TWICE DAILY  527 g  6  . prazosin (MINIPRESS) 1 MG capsule Take 3 mg by mouth at bedtime.      . topiramate (TOPAMAX) 25 MG capsule Take 2 capsules (50 mg total) by mouth 2 (two) times daily.  120 capsule  3  . Vortioxetine HBr (BRINTELLIX) 10 MG TABS Take 10 mg by mouth daily.  30 tablet  6  . HYDROcodone-acetaminophen (NORCO/VICODIN) 5-325 MG per tablet       . [DISCONTINUED] hydrochlorothiazide (MICROZIDE) 12.5 MG capsule Take 12.5 mg by mouth daily.      . [DISCONTINUED] potassium chloride SA (K-DUR,KLOR-CON) 20 MEQ tablet Take 20 mEq by mouth daily.       No current facility-administered medications on file prior to visit.      Review of Systems  Constitutional: Negative for fever and chills.  HENT: Negative.   Respiratory: Negative.   Cardiovascular: Negative.   Gastrointestinal: Positive for vomiting.  Skin: Positive for rash.       Itchy scalp       Objective:   Physical Exam  Constitutional: No distress.  Pleasant 27 y/o female  HENT:  Head: Normocephalic and atraumatic.  Cardiovascular: Normal rate and regular rhythm.   Pulmonary/Chest: Effort normal and breath sounds normal. No respiratory distress.  Abdominal: Soft. Bowel sounds are normal.  Neurological: She is alert.  Skin: Rash noted. There is erythema.  Maculopapular rash anterior chest and upper back. Scalp without any redness or rash. Scalp is intact.  Psychiatric: She has a normal mood and affect. Her behavior is normal.    BP 102/68  Pulse 87   Temp(Src) 97.8 F (36.6 C) (Oral)  Resp 12  Wt 198 lb (89.812 kg)  SpO2 97%       Assessment & Plan:

## 2013-10-16 NOTE — Assessment & Plan Note (Addendum)
Does not appear like drug reaction rash. Uncertain etiology. No distress. Urine test normal. Try Neutrogena shampoo for itchy scalp.  Ivory soap for her skin. Try calamine lotion for the area with the rash. Benadryl 25 mg every 8 hours as needed for itching.

## 2013-10-16 NOTE — Assessment & Plan Note (Signed)
This has been an ongoing problem. She is followed by GI. Mom reports that she has an appointment to be seen with GI. She takes zofran for nausea. Also on PPI

## 2013-10-17 DIAGNOSIS — F330 Major depressive disorder, recurrent, mild: Secondary | ICD-10-CM | POA: Diagnosis not present

## 2013-10-17 DIAGNOSIS — R6250 Unspecified lack of expected normal physiological development in childhood: Secondary | ICD-10-CM | POA: Diagnosis not present

## 2013-10-17 DIAGNOSIS — F411 Generalized anxiety disorder: Secondary | ICD-10-CM | POA: Diagnosis not present

## 2013-10-18 ENCOUNTER — Other Ambulatory Visit: Payer: Self-pay | Admitting: *Deleted

## 2013-10-18 MED ORDER — CHLORPROMAZINE HCL 25 MG PO TABS: 25.0000 mg | ORAL_TABLET | ORAL | Status: DC

## 2013-10-18 NOTE — Telephone Encounter (Signed)
This is generally filled by her psychiatrist.

## 2013-10-18 NOTE — Telephone Encounter (Signed)
Eprescribed.

## 2013-10-18 NOTE — Telephone Encounter (Signed)
Ok to refill 

## 2013-10-31 DIAGNOSIS — R112 Nausea with vomiting, unspecified: Secondary | ICD-10-CM | POA: Diagnosis not present

## 2013-10-31 DIAGNOSIS — R1033 Periumbilical pain: Secondary | ICD-10-CM | POA: Diagnosis not present

## 2013-11-02 ENCOUNTER — Ambulatory Visit: Payer: Self-pay | Admitting: Gastroenterology

## 2013-11-02 DIAGNOSIS — R1031 Right lower quadrant pain: Secondary | ICD-10-CM | POA: Diagnosis not present

## 2013-11-02 DIAGNOSIS — R1084 Generalized abdominal pain: Secondary | ICD-10-CM | POA: Diagnosis not present

## 2013-11-02 DIAGNOSIS — R112 Nausea with vomiting, unspecified: Secondary | ICD-10-CM | POA: Diagnosis not present

## 2013-11-22 ENCOUNTER — Telehealth: Payer: Self-pay | Admitting: Internal Medicine

## 2013-11-22 NOTE — Telephone Encounter (Signed)
Harvie Heck called, stated [name] did not receive records that were requested.  Request completed in office and sent to [facility] 11/24.  Called [employer], stated request was completed and mailed to [name] 11/10/2013.  Curlene Labrum if we send again, per [employer] he would be charged again.  Pt agrees and would like to resend paperwork.  Per [employer] they will resend the paperwork directly to [facility], and will be calling him tomorrow (12/17) to discuss.  Left msg for Harvie Heck to call office to let him know they will send directly to his address and that they will be calling him directly tomorrow.

## 2013-11-25 ENCOUNTER — Other Ambulatory Visit: Payer: Self-pay | Admitting: Internal Medicine

## 2013-11-28 DIAGNOSIS — K429 Umbilical hernia without obstruction or gangrene: Secondary | ICD-10-CM | POA: Diagnosis not present

## 2013-12-12 ENCOUNTER — Ambulatory Visit: Payer: Self-pay | Admitting: Surgery

## 2013-12-15 ENCOUNTER — Ambulatory Visit: Payer: Self-pay | Admitting: Surgery

## 2013-12-15 DIAGNOSIS — K429 Umbilical hernia without obstruction or gangrene: Secondary | ICD-10-CM | POA: Diagnosis not present

## 2013-12-15 DIAGNOSIS — F411 Generalized anxiety disorder: Secondary | ICD-10-CM | POA: Diagnosis not present

## 2013-12-15 DIAGNOSIS — G5790 Unspecified mononeuropathy of unspecified lower limb: Secondary | ICD-10-CM | POA: Diagnosis not present

## 2013-12-15 DIAGNOSIS — G4730 Sleep apnea, unspecified: Secondary | ICD-10-CM | POA: Diagnosis not present

## 2013-12-15 DIAGNOSIS — Z881 Allergy status to other antibiotic agents status: Secondary | ICD-10-CM | POA: Diagnosis not present

## 2013-12-15 DIAGNOSIS — Z880 Allergy status to penicillin: Secondary | ICD-10-CM | POA: Diagnosis not present

## 2013-12-15 DIAGNOSIS — Z888 Allergy status to other drugs, medicaments and biological substances status: Secondary | ICD-10-CM | POA: Diagnosis not present

## 2013-12-15 DIAGNOSIS — H53009 Unspecified amblyopia, unspecified eye: Secondary | ICD-10-CM | POA: Diagnosis not present

## 2013-12-15 DIAGNOSIS — F79 Unspecified intellectual disabilities: Secondary | ICD-10-CM | POA: Diagnosis not present

## 2013-12-15 DIAGNOSIS — K219 Gastro-esophageal reflux disease without esophagitis: Secondary | ICD-10-CM | POA: Diagnosis not present

## 2013-12-15 DIAGNOSIS — Z79899 Other long term (current) drug therapy: Secondary | ICD-10-CM | POA: Diagnosis not present

## 2014-01-04 DIAGNOSIS — F330 Major depressive disorder, recurrent, mild: Secondary | ICD-10-CM | POA: Diagnosis not present

## 2014-01-04 DIAGNOSIS — F411 Generalized anxiety disorder: Secondary | ICD-10-CM | POA: Diagnosis not present

## 2014-01-04 DIAGNOSIS — R6250 Unspecified lack of expected normal physiological development in childhood: Secondary | ICD-10-CM | POA: Diagnosis not present

## 2014-01-16 ENCOUNTER — Ambulatory Visit: Payer: Medicare Other | Admitting: Internal Medicine

## 2014-01-20 DIAGNOSIS — E669 Obesity, unspecified: Secondary | ICD-10-CM | POA: Diagnosis not present

## 2014-01-20 DIAGNOSIS — G4733 Obstructive sleep apnea (adult) (pediatric): Secondary | ICD-10-CM | POA: Diagnosis not present

## 2014-02-01 ENCOUNTER — Telehealth: Payer: Self-pay | Admitting: *Deleted

## 2014-02-01 DIAGNOSIS — R110 Nausea: Secondary | ICD-10-CM | POA: Diagnosis not present

## 2014-02-01 DIAGNOSIS — J069 Acute upper respiratory infection, unspecified: Secondary | ICD-10-CM | POA: Diagnosis not present

## 2014-02-01 NOTE — Telephone Encounter (Signed)
Patient father called this morning stating she has been throwing up and can not keep anything down all morning. Informed no openings available in office today, take patient to UC to be treated. Patient father verbalized understanding and agree to do as instructed.

## 2014-02-07 ENCOUNTER — Ambulatory Visit (INDEPENDENT_AMBULATORY_CARE_PROVIDER_SITE_OTHER): Payer: Medicare Other | Admitting: Internal Medicine

## 2014-02-07 ENCOUNTER — Encounter: Payer: Self-pay | Admitting: Internal Medicine

## 2014-02-07 VITALS — BP 100/78 | HR 94 | Temp 98.3°F | Wt 198.0 lb

## 2014-02-07 DIAGNOSIS — L259 Unspecified contact dermatitis, unspecified cause: Secondary | ICD-10-CM | POA: Diagnosis not present

## 2014-02-07 DIAGNOSIS — G4700 Insomnia, unspecified: Secondary | ICD-10-CM | POA: Diagnosis not present

## 2014-02-07 DIAGNOSIS — G894 Chronic pain syndrome: Secondary | ICD-10-CM | POA: Diagnosis not present

## 2014-02-07 DIAGNOSIS — L309 Dermatitis, unspecified: Secondary | ICD-10-CM

## 2014-02-07 LAB — LIPID PANEL
CHOL/HDL RATIO: 5
Cholesterol: 164 mg/dL (ref 0–200)
HDL: 36.2 mg/dL — ABNORMAL LOW (ref >39.00)
LDL CALC: 87 mg/dL (ref 0–99)
Triglycerides: 205 mg/dL — ABNORMAL HIGH (ref 0.0–149.0)
VLDL: 41 mg/dL — ABNORMAL HIGH (ref 0.0–40.0)

## 2014-02-07 LAB — CBC WITH DIFFERENTIAL/PLATELET
Basophils Absolute: 0 10*3/uL (ref 0.0–0.1)
Basophils Relative: 0.3 % (ref 0.0–3.0)
EOS ABS: 0.1 10*3/uL (ref 0.0–0.7)
Eosinophils Relative: 1.3 % (ref 0.0–5.0)
HCT: 37.9 % (ref 36.0–46.0)
Hemoglobin: 12.6 g/dL (ref 12.0–15.0)
LYMPHS PCT: 27.9 % (ref 12.0–46.0)
Lymphs Abs: 2.7 10*3/uL (ref 0.7–4.0)
MCHC: 33.2 g/dL (ref 30.0–36.0)
MCV: 95.8 fl (ref 78.0–100.0)
MONO ABS: 0.5 10*3/uL (ref 0.1–1.0)
Monocytes Relative: 5 % (ref 3.0–12.0)
Neutro Abs: 6.2 10*3/uL (ref 1.4–7.7)
Neutrophils Relative %: 65.5 % (ref 43.0–77.0)
PLATELETS: 308 10*3/uL (ref 150.0–400.0)
RBC: 3.96 Mil/uL (ref 3.87–5.11)
RDW: 13.6 % (ref 11.5–14.6)
WBC: 9.5 10*3/uL (ref 4.5–10.5)

## 2014-02-07 LAB — COMPREHENSIVE METABOLIC PANEL
ALBUMIN: 3.9 g/dL (ref 3.5–5.2)
ALK PHOS: 90 U/L (ref 39–117)
ALT: 15 U/L (ref 0–35)
AST: 12 U/L (ref 0–37)
BUN: 8 mg/dL (ref 6–23)
CO2: 26 mEq/L (ref 19–32)
Calcium: 9.1 mg/dL (ref 8.4–10.5)
Chloride: 104 mEq/L (ref 96–112)
Creatinine, Ser: 0.8 mg/dL (ref 0.4–1.2)
GFR: 93.68 mL/min (ref >60.00)
Glucose, Bld: 82 mg/dL (ref 70–99)
Potassium: 3.4 mEq/L — ABNORMAL LOW (ref 3.5–5.1)
Sodium: 134 mEq/L — ABNORMAL LOW (ref 135–145)
Total Bilirubin: 0.6 mg/dL (ref 0.3–1.2)
Total Protein: 7 g/dL (ref 6.0–8.3)

## 2014-02-07 LAB — HEMOGLOBIN A1C: HEMOGLOBIN A1C: 5.6 % (ref 4.6–6.5)

## 2014-02-07 LAB — TSH: TSH: 0.53 u[IU]/mL (ref 0.35–5.50)

## 2014-02-07 MED ORDER — ZALEPLON 5 MG PO CAPS: 5.0000 mg | ORAL_CAPSULE | Freq: Every evening | ORAL | Status: DC | PRN

## 2014-02-07 MED ORDER — ALBUTEROL SULFATE HFA 108 (90 BASE) MCG/ACT IN AERS
2.0000 | INHALATION_SPRAY | Freq: Four times a day (QID) | RESPIRATORY_TRACT | Status: DC | PRN
Start: 2014-02-07 — End: 2015-07-06

## 2014-02-07 MED ORDER — CLONAZEPAM 0.5 MG PO TABS
0.5000 mg | ORAL_TABLET | Freq: Two times a day (BID) | ORAL | Status: DC
Start: 2014-02-07 — End: 2015-07-09

## 2014-02-07 MED ORDER — GABAPENTIN 800 MG PO TABS: 800.0000 mg | ORAL_TABLET | Freq: Three times a day (TID) | ORAL | Status: DC

## 2014-02-07 NOTE — Assessment & Plan Note (Signed)
Pt repeatedly complains of diffuse pain, including arthralgia and burning pain in her bilateral legs. No focal exam findings to explain chronic severe pain. Will request previous EMG testing report. Unclear what is causing her pain. Her caregivers repeatedly request Vicodin and Percocet for her pain and I am concerned about potential misuse of medications. Given her developmental delay, it is very difficult to assess overall pain symptoms. Will increase dose of Neurontin to 800mg  po tid and will set up evaluation with pain management.  Cautioned family to monitor for sedation on Neurontin. Will also screen for DM with A1c with labs.

## 2014-02-07 NOTE — Assessment & Plan Note (Signed)
Diffuse folliculitis over trunk and groin. No improvement with topical Flagyl. No improvement after recent cream (unknown) prescribed by dermatologist. Recommended followup with dermatologist. Will set up visit.

## 2014-02-07 NOTE — Progress Notes (Signed)
Pre visit review using our clinic review tool, if applicable. No additional management support is needed unless otherwise documented below in the visit note. 

## 2014-02-07 NOTE — Progress Notes (Signed)
Subjective:    Patient ID: Jamie Ross, female    DOB: 1986-09-30, 28 y.o.   MRN: [id number]  HPI 27YO female with developmental delay presents for follow up.  Went to urgent care and was diagnosed with flu, last week. Continues to have some diffuse aching pain. Patient's father specifically requests either Percocet or Vicodin to help treat patient's pain symptoms. He reports that Vicodin has worked well for the patient in the past. He reports that she has had minimal improvement with use of Neurontin. Apparently, she has had EMG testing in the past which confirmed neuropathy however records of this are not available today.  Patient is also concerned about difficulty sleeping. In the past, she has taken Ambien per her report. Father requests refill of this today.  She is also concerned about diffuse pustular rash over trunk and groin. This has been present for months, with no improvement with topical Metronidazole. She was seen by dermatology, and treated with unknown cream, but had no improvement with this. No fever, chills.   Review of Systems  Constitutional: Negative for fever, chills, appetite change, fatigue and unexpected weight change.  HENT: Negative for congestion, ear pain, sinus pressure, sore throat, trouble swallowing and voice change.   Eyes: Negative for visual disturbance.  Respiratory: Negative for cough, shortness of breath, wheezing and stridor.   Cardiovascular: Negative for chest pain, palpitations and leg swelling.  Gastrointestinal: Negative for nausea, vomiting, abdominal pain, diarrhea, constipation, blood in stool, abdominal distention and anal bleeding.  Genitourinary: Negative for dysuria and flank pain.  Musculoskeletal: Positive for arthralgias and myalgias. Negative for gait problem and neck pain.  Skin: Positive for rash. Negative for color change.  Neurological: Negative for dizziness and headaches.  Hematological: Negative for adenopathy. Does not  bruise/bleed easily.  Psychiatric/Behavioral: Positive for sleep disturbance. Negative for suicidal ideas and dysphoric mood. The patient is not nervous/anxious.        Objective:    BP 100/78  Pulse 94  Temp(Src) 98.3 F (36.8 C) (Oral)  Wt 198 lb (89.812 kg)  SpO2 94% Physical Exam  Constitutional: She is oriented to person, place, and time. She appears well-developed and well-nourished. No distress.  HENT:  Head: Normocephalic and atraumatic.  Right Ear: External ear normal.  Left Ear: External ear normal.  Nose: Nose normal.  Mouth/Throat: Oropharynx is clear and moist. No oropharyngeal exudate.  Eyes: Conjunctivae are normal. Pupils are equal, round, and reactive to light. Right eye exhibits no discharge. Left eye exhibits no discharge. No scleral icterus.  Neck: Normal range of motion. Neck supple. No tracheal deviation present. No thyromegaly present.  Cardiovascular: Normal rate, regular rhythm, normal heart sounds and intact distal pulses.  Exam reveals no gallop and no friction rub.   No murmur heard. Pulmonary/Chest: Effort normal and breath sounds normal. No accessory muscle usage. Not tachypneic. No respiratory distress. She has no decreased breath sounds. She has no wheezes. She has no rhonchi. She has no rales. She exhibits no tenderness.  Musculoskeletal: Normal range of motion. She exhibits no edema and no tenderness.  Lymphadenopathy:    She has no cervical adenopathy.  Neurological: She is alert and oriented to person, place, and time. No cranial nerve deficit. She exhibits normal muscle tone. Coordination normal.  Skin: Skin is warm and dry. Rash noted. Rash is pustular (over trunk and groin). She is not diaphoretic. No erythema. No pallor.  Psychiatric: She has a normal mood and affect. Thought content normal. She is  withdrawn. Cognition and memory are impaired. She expresses impulsivity and inappropriate judgment.          Assessment & Plan:   Problem  List Items Addressed This Visit   Chronic pain syndrome - Primary     Pt repeatedly complains of diffuse pain, including arthralgia and burning pain in her bilateral legs. No focal exam findings to explain chronic severe pain. Will request previous EMG testing report. Unclear what is causing her pain. Her caregivers repeatedly request Vicodin and Percocet for her pain and I am concerned about potential misuse of medications. Given her developmental delay, it is very difficult to assess overall pain symptoms. Will increase dose of Neurontin to $RemoveBefo'800mg'STLbmLTqAOV$  po tid and will set up evaluation with pain management.  Cautioned family to monitor for sedation on Neurontin. Will also screen for DM with A1c with labs.    Relevant Medications      gabapentin (NEURONTIN) tablet   Other Relevant Orders      Ambulatory referral to Pain Clinic      Comp Met (CMET)      Lipid Profile      HgB A1c      TSH      CBC w/Diff   Dermatitis     Diffuse folliculitis over trunk and groin. No improvement with topical Flagyl. No improvement after recent cream (unknown) prescribed by dermatologist. Recommended followup with dermatologist. Will set up visit.    Relevant Orders      Ambulatory referral to Dermatology   Insomnia     Chronic insomnia. Family members request Ambien for treatment. Recommended trying Sonata first $RemoveBefo'5mg'tsAtzZcEsuq$  at bedtime. Follow up 4 weeks and prn.    Relevant Medications      zaleplon (SONATA) capsule       Return in about 4 weeks (around 03/07/2014).

## 2014-02-07 NOTE — Assessment & Plan Note (Signed)
Chronic insomnia. Family members request Ambien for treatment. Recommended trying Sonata first 5mg  at bedtime. Follow up 4 weeks and prn.

## 2014-02-08 ENCOUNTER — Encounter: Payer: Self-pay | Admitting: *Deleted

## 2014-02-20 DIAGNOSIS — L708 Other acne: Secondary | ICD-10-CM | POA: Diagnosis not present

## 2014-02-21 DIAGNOSIS — K3184 Gastroparesis: Secondary | ICD-10-CM | POA: Diagnosis not present

## 2014-02-21 DIAGNOSIS — R112 Nausea with vomiting, unspecified: Secondary | ICD-10-CM | POA: Diagnosis not present

## 2014-03-03 ENCOUNTER — Ambulatory Visit: Payer: Self-pay | Admitting: Gastroenterology

## 2014-03-03 DIAGNOSIS — K228 Other specified diseases of esophagus: Secondary | ICD-10-CM | POA: Diagnosis not present

## 2014-03-03 DIAGNOSIS — K2289 Other specified disease of esophagus: Secondary | ICD-10-CM | POA: Diagnosis not present

## 2014-03-03 DIAGNOSIS — R112 Nausea with vomiting, unspecified: Secondary | ICD-10-CM | POA: Diagnosis not present

## 2014-03-03 DIAGNOSIS — K219 Gastro-esophageal reflux disease without esophagitis: Secondary | ICD-10-CM | POA: Diagnosis not present

## 2014-03-07 ENCOUNTER — Ambulatory Visit: Payer: Self-pay | Admitting: Internal Medicine

## 2014-03-07 ENCOUNTER — Encounter: Payer: Self-pay | Admitting: Internal Medicine

## 2014-03-07 ENCOUNTER — Ambulatory Visit (INDEPENDENT_AMBULATORY_CARE_PROVIDER_SITE_OTHER): Payer: Medicare Other | Admitting: Internal Medicine

## 2014-03-07 ENCOUNTER — Telehealth: Payer: Self-pay | Admitting: Internal Medicine

## 2014-03-07 VITALS — BP 116/80 | HR 75 | Temp 97.7°F | Wt 197.0 lb

## 2014-03-07 DIAGNOSIS — R05 Cough: Secondary | ICD-10-CM | POA: Diagnosis not present

## 2014-03-07 DIAGNOSIS — G894 Chronic pain syndrome: Secondary | ICD-10-CM | POA: Diagnosis not present

## 2014-03-07 DIAGNOSIS — G4700 Insomnia, unspecified: Secondary | ICD-10-CM | POA: Diagnosis not present

## 2014-03-07 DIAGNOSIS — K219 Gastro-esophageal reflux disease without esophagitis: Secondary | ICD-10-CM | POA: Diagnosis not present

## 2014-03-07 DIAGNOSIS — L708 Other acne: Secondary | ICD-10-CM | POA: Diagnosis not present

## 2014-03-07 DIAGNOSIS — R6883 Chills (without fever): Secondary | ICD-10-CM | POA: Diagnosis not present

## 2014-03-07 DIAGNOSIS — R059 Cough, unspecified: Secondary | ICD-10-CM | POA: Diagnosis not present

## 2014-03-07 DIAGNOSIS — R110 Nausea: Secondary | ICD-10-CM

## 2014-03-07 DIAGNOSIS — L709 Acne, unspecified: Secondary | ICD-10-CM

## 2014-03-07 MED ORDER — GABAPENTIN 800 MG PO TABS: 800.0000 mg | ORAL_TABLET | Freq: Three times a day (TID) | ORAL | Status: DC

## 2014-03-07 MED ORDER — POLYETHYLENE GLYCOL 3350 17 GM/SCOOP PO POWD: ORAL | Status: DC

## 2014-03-07 MED ORDER — ZALEPLON 5 MG PO CAPS: 5.0000 mg | ORAL_CAPSULE | Freq: Every evening | ORAL | Status: DC | PRN

## 2014-03-07 NOTE — Telephone Encounter (Signed)
CXR was normal. I would recommend for her to continue supportive care for viral upper respiratory infection as we discussed.

## 2014-03-07 NOTE — Assessment & Plan Note (Signed)
Pain symptoms improved with higher dose of neurontin. Referral to pain management in progress.

## 2014-03-07 NOTE — Progress Notes (Signed)
Subjective:    Patient ID: Jamie Ross, female    DOB: Jan 31, 1986, 28 y.o.   MRN: [id number]  HPI 27YO female with developmental delay presents for follow up.  GERD - Had barium swallow. Reports noted Acid Reflux. Ordered by GI. Having persistent nausea. Taking Ondansetron two 8mg  tablets 1-2 times daily.  Chronic pain - Has not been seen by pain clinic.Pt mother notes pt pain symptoms seem improved with higher dose of neurontin. No side effects noted from this medication.  Notes 2-3 days of sneezing, productive cough, clear runny nose. Occasional chills. No fever. Taking Allegra and Flonase with no improvement.  Dermatitis - Seen at [facility]. Started on pill for acne. Unsure name.  Review of Systems  Constitutional: Positive for chills. Negative for fever, appetite change, fatigue and unexpected weight change.  HENT: Positive for congestion, postnasal drip, rhinorrhea and sneezing. Negative for ear pain, sinus pressure, sore throat, trouble swallowing and voice change.   Eyes: Negative for visual disturbance.  Respiratory: Negative for cough, shortness of breath, wheezing and stridor.   Cardiovascular: Negative for chest pain, palpitations and leg swelling.  Gastrointestinal: Positive for nausea and abdominal pain. Negative for vomiting, diarrhea, constipation, blood in stool, abdominal distention and anal bleeding.  Genitourinary: Negative for dysuria and flank pain.  Musculoskeletal: Positive for arthralgias and myalgias. Negative for gait problem and neck pain.  Skin: Negative for color change and rash.  Neurological: Negative for dizziness and headaches.  Hematological: Negative for adenopathy. Does not bruise/bleed easily.  Psychiatric/Behavioral: Negative for suicidal ideas, sleep disturbance and dysphoric mood. The patient is not nervous/anxious.        Objective:    BP 116/80  Pulse 75  Temp(Src) 97.7 F (36.5 C) (Oral)  Wt 197 lb (89.359 kg)  SpO2  98% Physical Exam  Constitutional: She is oriented to person, place, and time. She appears well-developed and well-nourished. No distress.  HENT:  Head: Normocephalic and atraumatic.  Right Ear: Tympanic membrane and external ear normal.  Left Ear: Tympanic membrane and external ear normal.  Nose: Nose normal.  Mouth/Throat: Oropharynx is clear and moist. No oropharyngeal exudate.  Eyes: Conjunctivae are normal. Pupils are equal, round, and reactive to light. Right eye exhibits no discharge. Left eye exhibits no discharge. No scleral icterus.  Neck: Normal range of motion. Neck supple. No tracheal deviation present. No thyromegaly present.  Cardiovascular: Normal rate, regular rhythm, normal heart sounds and intact distal pulses.  Exam reveals no gallop and no friction rub.   No murmur heard. Pulmonary/Chest: Effort normal and breath sounds normal. No accessory muscle usage. Not tachypneic. No respiratory distress. She has no decreased breath sounds. She has no wheezes. She has no rhonchi. She has no rales. She exhibits no tenderness.  Musculoskeletal: Normal range of motion. She exhibits no edema and no tenderness.  Lymphadenopathy:    She has no cervical adenopathy.  Neurological: She is alert and oriented to person, place, and time. No cranial nerve deficit. She exhibits normal muscle tone. Coordination normal.  Skin: Skin is warm and dry. No rash noted. She is not diaphoretic. No erythema. No pallor.  Psychiatric: She has a normal mood and affect. Her behavior is normal. Thought content normal. Cognition and memory are impaired. She expresses impulsivity and inappropriate judgment.          Assessment & Plan:   Problem List Items Addressed This Visit   Acne     Pt reports dermatologist recently started "pill" for acne.  Unsure name. Will request records from [place].    Chronic nausea     Persistent symptoms of nausea. Reports recent barium swallow was abnormal. Will request records  on this. Continue prn ondansetron.    Chronic pain syndrome - Primary     Pain symptoms improved with higher dose of neurontin. Referral to pain management in progress.    Relevant Medications      gabapentin (NEURONTIN) tablet   Cough     Recent congestion and cough most likely secondary to viral URI. Exam is normal. However given productive cough and chills, will get CXR for evaluation. Encouraged continued rest, adequate fluids, use of Allegra and Flonase and prn cough syrup.    GERD (gastroesophageal reflux disease)     Symptoms poorly controlled with epigastric pain and nausea. Will request records on recent barium swallow and notes from GI. Continue pantoprazole 40mg  bid.    Relevant Medications      polyethylene glycol powder (GLYCOLAX/MIRALAX) powder   Insomnia     Symptoms improved with Sonata. Will continue.    Relevant Medications      zaleplon (SONATA) capsule       Return in about 3 months (around 06/06/2014).

## 2014-03-07 NOTE — Assessment & Plan Note (Signed)
Symptoms improved with Sonata. Will continue. 

## 2014-03-07 NOTE — Assessment & Plan Note (Signed)
Recent congestion and cough most likely secondary to viral URI. Exam is normal. However given productive cough and chills, will get CXR for evaluation. Encouraged continued rest, adequate fluids, use of Allegra and Flonase and prn cough syrup.

## 2014-03-07 NOTE — Assessment & Plan Note (Signed)
Symptoms poorly controlled with epigastric pain and nausea. Will request records on recent barium swallow and notes from GI. Continue pantoprazole 40mg  bid.

## 2014-03-07 NOTE — Progress Notes (Signed)
Pre visit review using our clinic review tool, if applicable. No additional management support is needed unless otherwise documented below in the visit note. 

## 2014-03-07 NOTE — Assessment & Plan Note (Signed)
Pt reports dermatologist recently started "pill" for acne. Unsure name. Will request records from [place].

## 2014-03-07 NOTE — Assessment & Plan Note (Signed)
Persistent symptoms of nausea. Reports recent barium swallow was abnormal. Will request records on this. Continue prn ondansetron.

## 2014-03-08 NOTE — Telephone Encounter (Signed)
Left message to call back  

## 2014-03-08 NOTE — Telephone Encounter (Signed)
Informed patient's father of the results, he verbalized understanding and agree with the plan.

## 2014-03-18 ENCOUNTER — Other Ambulatory Visit: Payer: Self-pay | Admitting: Internal Medicine

## 2014-03-27 ENCOUNTER — Ambulatory Visit (INDEPENDENT_AMBULATORY_CARE_PROVIDER_SITE_OTHER): Payer: Medicare Other | Admitting: Internal Medicine

## 2014-03-27 ENCOUNTER — Encounter: Payer: Self-pay | Admitting: Internal Medicine

## 2014-03-27 VITALS — BP 120/78 | HR 98 | Temp 97.9°F | Wt 201.0 lb

## 2014-03-27 DIAGNOSIS — N390 Urinary tract infection, site not specified: Secondary | ICD-10-CM | POA: Diagnosis not present

## 2014-03-27 LAB — POCT URINALYSIS DIPSTICK
Bilirubin, UA: NEGATIVE
Blood, UA: NEGATIVE
GLUCOSE UA: NEGATIVE
Ketones, UA: NEGATIVE
Nitrite, UA: NEGATIVE
PROTEIN UA: NEGATIVE
Spec Grav, UA: 1.02
UROBILINOGEN UA: 0.2
pH, UA: 7

## 2014-03-27 MED ORDER — CIPROFLOXACIN HCL 500 MG PO TABS: 500.0000 mg | ORAL_TABLET | Freq: Two times a day (BID) | ORAL | Status: DC

## 2014-03-27 NOTE — Assessment & Plan Note (Signed)
Symptoms most consistent with UTI. UA pos only for leukocytes. Will send urine for culture. Will start empiric Cipro. Pt will call if symptoms are not improving.

## 2014-03-27 NOTE — Progress Notes (Signed)
Pre visit review using our clinic review tool, if applicable. No additional management support is needed unless otherwise documented below in the visit note. 

## 2014-03-27 NOTE — Patient Instructions (Signed)
Urinary Tract Infection  Urinary tract infections (UTIs) can develop anywhere along your urinary tract. Your urinary tract is your body's drainage system for removing wastes and extra water. Your urinary tract includes two kidneys, two ureters, a bladder, and a urethra. Your kidneys are a pair of bean-shaped organs. Each kidney is about the size of your fist. They are located below your ribs, one on each side of your spine.  CAUSES  Infections are caused by microbes, which are microscopic organisms, including fungi, viruses, and bacteria. These organisms are so small that they can only be seen through a microscope. Bacteria are the microbes that most commonly cause UTIs.  SYMPTOMS   Symptoms of UTIs may vary by age and gender of the patient and by the location of the infection. Symptoms in young women typically include a frequent and intense urge to urinate and a painful, burning feeling in the bladder or urethra during urination. Older women and men are more likely to be tired, shaky, and weak and have muscle aches and abdominal pain. A fever may mean the infection is in your kidneys. Other symptoms of a kidney infection include pain in your back or sides below the ribs, nausea, and vomiting.  DIAGNOSIS  To diagnose a UTI, your caregiver will ask you about your symptoms. Your caregiver also will ask to provide a urine sample. The urine sample will be tested for bacteria and white blood cells. White blood cells are made by your body to help fight infection.  TREATMENT   Typically, UTIs can be treated with medication. Because most UTIs are caused by a bacterial infection, they usually can be treated with the use of antibiotics. The choice of antibiotic and length of treatment depend on your symptoms and the type of bacteria causing your infection.  HOME CARE INSTRUCTIONS   If you were prescribed antibiotics, take them exactly as your caregiver instructs you. Finish the medication even if you feel better after you  have only taken some of the medication.   Drink enough water and fluids to keep your urine clear or pale yellow.   Avoid caffeine, tea, and carbonated beverages. They tend to irritate your bladder.   Empty your bladder often. Avoid holding urine for long periods of time.   Empty your bladder before and after sexual intercourse.   After a bowel movement, women should cleanse from front to back. Use each tissue only once.  SEEK MEDICAL CARE IF:    You have back pain.   You develop a fever.   Your symptoms do not begin to resolve within 3 days.  SEEK IMMEDIATE MEDICAL CARE IF:    You have severe back pain or lower abdominal pain.   You develop chills.   You have nausea or vomiting.   You have continued burning or discomfort with urination.  MAKE SURE YOU:    Understand these instructions.   Will watch your condition.   Will get help right away if you are not doing well or get worse.  Document Released: 09/03/2005 Document Revised: 05/25/2012 Document Reviewed: 01/02/2012  [employer] Patient Information 2014 [employer]

## 2014-03-27 NOTE — Progress Notes (Signed)
   Subjective:    Patient ID: Jamie Ross, female    DOB: May 23, 1986, 28 y.o.   MRN: [id number]  HPI 27YO female presents for acute visit. Generally not feeling well. Hot then cold. No measured fever. Notes burning with urination, urinary frequency, urgency. "White appearance" to urine. Also notes some nasal congestion, sore throat. No sick contacts. Taking some cough medication at night to help with sleep.   Review of Systems  Constitutional: Positive for chills and fatigue. Negative for fever.  Gastrointestinal: Negative for nausea, vomiting, abdominal pain, diarrhea, constipation and rectal pain.  Genitourinary: Positive for dysuria, urgency and frequency. Negative for hematuria, flank pain, decreased urine volume, vaginal bleeding, vaginal discharge, difficulty urinating, vaginal pain and pelvic pain.       Objective:    BP 120/78  Pulse 98  Temp(Src) 97.9 F (36.6 C) (Oral)  Wt 201 lb (91.173 kg)  SpO2 97% Physical Exam  Constitutional: She is oriented to person, place, and time. She appears well-developed and well-nourished. No distress.  HENT:  Head: Normocephalic and atraumatic.  Right Ear: External ear normal.  Left Ear: External ear normal.  Nose: Nose normal.  Mouth/Throat: Oropharynx is clear and moist. No oropharyngeal exudate.  Eyes: Conjunctivae are normal. Pupils are equal, round, and reactive to light. Right eye exhibits no discharge. Left eye exhibits no discharge. No scleral icterus.  Neck: Normal range of motion. Neck supple. No tracheal deviation present. No thyromegaly present.  Cardiovascular: Normal rate, regular rhythm, normal heart sounds and intact distal pulses.  Exam reveals no gallop and no friction rub.   No murmur heard. Pulmonary/Chest: Effort normal and breath sounds normal. No accessory muscle usage. Not tachypneic. No respiratory distress. She has no decreased breath sounds. She has no wheezes. She has no rhonchi. She has no rales. She  exhibits no tenderness.  Abdominal: There is tenderness (bilateral mild CVA tenderness).  Musculoskeletal: Normal range of motion. She exhibits no edema and no tenderness.  Lymphadenopathy:    She has no cervical adenopathy.  Neurological: She is alert and oriented to person, place, and time. No cranial nerve deficit. She exhibits normal muscle tone. Coordination normal.  Skin: Skin is warm and dry. No rash noted. She is not diaphoretic. No erythema. No pallor.  Psychiatric: She has a normal mood and affect. Her behavior is normal. Judgment and thought content normal.      Rapid strep neg    Assessment & Plan:   Problem List Items Addressed This Visit   UTI (urinary tract infection) - Primary     Symptoms most consistent with UTI. UA pos only for leukocytes. Will send urine for culture. Will start empiric Cipro. Pt will call if symptoms are not improving.    Relevant Medications      ciprofloxacin (CIPRO) tablet   Other Relevant Orders      POCT Urinalysis Dipstick (Completed)      CULTURE, URINE COMPREHENSIVE       Return in about 4 weeks (around 04/24/2014) for Recheck.

## 2014-03-29 ENCOUNTER — Telehealth: Payer: Self-pay | Admitting: *Deleted

## 2014-03-29 DIAGNOSIS — G8929 Other chronic pain: Secondary | ICD-10-CM

## 2014-03-29 DIAGNOSIS — R102 Pelvic and perineal pain: Principal | ICD-10-CM

## 2014-03-29 LAB — CULTURE, URINE COMPREHENSIVE
COLONY COUNT: NO GROWTH
ORGANISM ID, BACTERIA: NO GROWTH

## 2014-03-29 NOTE — Telephone Encounter (Signed)
We may need to set up evaluation with gynecology given her chronic pelvic pain. I can place referral for this if they are interested.

## 2014-03-29 NOTE — Telephone Encounter (Signed)
Spoke with patient's father to inform him of patient's results he verbalized understanding. Stated most of the time when she uses the bathroom she complains of being in pain.

## 2014-03-29 NOTE — Telephone Encounter (Signed)
No answer or voicemail to leave message 

## 2014-03-29 NOTE — Telephone Encounter (Signed)
Message copied by Ronaldo Miyamoto on Wed Mar 29, 2014  2:48 PM ------      Message from: Ronette Deter A      Created: Wed Mar 29, 2014  7:13 AM       Urine culture was negative. She can stop the antibiotics. Can you see if she is feeling better? ------

## 2014-03-30 ENCOUNTER — Encounter: Payer: Self-pay | Admitting: Emergency Medicine

## 2014-03-30 NOTE — Addendum Note (Signed)
Addended by: Ronette Deter A on: 03/30/2014 12:18 PM   Modules accepted: Orders

## 2014-03-30 NOTE — Telephone Encounter (Signed)
Spoke with patient's father informed him of recommendations and he is agreeable. He would like her to be referred back to the same one you referred her to last time.

## 2014-04-03 ENCOUNTER — Encounter: Payer: Self-pay | Admitting: Internal Medicine

## 2014-04-05 DIAGNOSIS — R112 Nausea with vomiting, unspecified: Secondary | ICD-10-CM | POA: Diagnosis not present

## 2014-04-11 ENCOUNTER — Ambulatory Visit: Payer: Medicare Other | Admitting: Internal Medicine

## 2014-04-11 ENCOUNTER — Other Ambulatory Visit: Payer: Self-pay | Admitting: Internal Medicine

## 2014-04-11 MED ORDER — ONDANSETRON 8 MG PO TBDP: 8.0000 mg | ORAL_TABLET | Freq: Three times a day (TID) | ORAL | Status: DC | PRN

## 2014-04-11 NOTE — Telephone Encounter (Signed)
Ok refill? 

## 2014-04-11 NOTE — Telephone Encounter (Signed)
States zofran was to have been called in and is not at pharmacy ready for pick up.  Zofran needed.

## 2014-04-26 DIAGNOSIS — F330 Major depressive disorder, recurrent, mild: Secondary | ICD-10-CM | POA: Diagnosis not present

## 2014-04-27 DIAGNOSIS — Z124 Encounter for screening for malignant neoplasm of cervix: Secondary | ICD-10-CM | POA: Diagnosis not present

## 2014-04-27 DIAGNOSIS — Z1151 Encounter for screening for human papillomavirus (HPV): Secondary | ICD-10-CM | POA: Diagnosis not present

## 2014-04-27 DIAGNOSIS — B373 Candidiasis of vulva and vagina: Secondary | ICD-10-CM | POA: Diagnosis not present

## 2014-04-27 DIAGNOSIS — R3989 Other symptoms and signs involving the genitourinary system: Secondary | ICD-10-CM | POA: Diagnosis not present

## 2014-04-27 DIAGNOSIS — B3731 Acute candidiasis of vulva and vagina: Secondary | ICD-10-CM | POA: Diagnosis not present

## 2014-04-28 DIAGNOSIS — G4700 Insomnia, unspecified: Secondary | ICD-10-CM | POA: Diagnosis not present

## 2014-05-05 DIAGNOSIS — Z0000 Encounter for general adult medical examination without abnormal findings: Secondary | ICD-10-CM | POA: Diagnosis not present

## 2014-05-05 DIAGNOSIS — Z79899 Other long term (current) drug therapy: Secondary | ICD-10-CM | POA: Diagnosis not present

## 2014-05-16 DIAGNOSIS — F330 Major depressive disorder, recurrent, mild: Secondary | ICD-10-CM | POA: Diagnosis not present

## 2014-05-25 DIAGNOSIS — M940 Chondrocostal junction syndrome [Tietze]: Secondary | ICD-10-CM | POA: Diagnosis not present

## 2014-06-06 DIAGNOSIS — F330 Major depressive disorder, recurrent, mild: Secondary | ICD-10-CM | POA: Diagnosis not present

## 2014-06-07 ENCOUNTER — Ambulatory Visit: Payer: Medicare Other | Admitting: Internal Medicine

## 2014-06-12 ENCOUNTER — Emergency Department: Payer: Self-pay | Admitting: Emergency Medicine

## 2014-06-12 DIAGNOSIS — R112 Nausea with vomiting, unspecified: Secondary | ICD-10-CM | POA: Diagnosis not present

## 2014-06-12 DIAGNOSIS — K219 Gastro-esophageal reflux disease without esophagitis: Secondary | ICD-10-CM | POA: Diagnosis not present

## 2014-06-12 DIAGNOSIS — Z888 Allergy status to other drugs, medicaments and biological substances status: Secondary | ICD-10-CM | POA: Diagnosis not present

## 2014-06-12 DIAGNOSIS — Z880 Allergy status to penicillin: Secondary | ICD-10-CM | POA: Diagnosis not present

## 2014-06-12 DIAGNOSIS — Z9889 Other specified postprocedural states: Secondary | ICD-10-CM | POA: Diagnosis not present

## 2014-06-12 DIAGNOSIS — Z79899 Other long term (current) drug therapy: Secondary | ICD-10-CM | POA: Diagnosis not present

## 2014-06-12 DIAGNOSIS — Z9089 Acquired absence of other organs: Secondary | ICD-10-CM | POA: Diagnosis not present

## 2014-06-12 LAB — URINALYSIS, COMPLETE
BLOOD: NEGATIVE
Bilirubin,UR: NEGATIVE
Glucose,UR: NEGATIVE mg/dL (ref 0–75)
Ketone: NEGATIVE
LEUKOCYTE ESTERASE: NEGATIVE
NITRITE: NEGATIVE
PH: 6 (ref 4.5–8.0)
PROTEIN: NEGATIVE
Specific Gravity: 1.016 (ref 1.003–1.030)
Squamous Epithelial: 1
WBC UR: 2 /HPF (ref 0–5)

## 2014-06-12 LAB — CBC
HCT: 39.9 % (ref 35.0–47.0)
HGB: 13.5 g/dL (ref 12.0–16.0)
MCH: 32.7 pg (ref 26.0–34.0)
MCHC: 33.9 g/dL (ref 32.0–36.0)
MCV: 96 fL (ref 80–100)
Platelet: 307 10*3/uL (ref 150–440)
RBC: 4.14 10*6/uL (ref 3.80–5.20)
RDW: 13.3 % (ref 11.5–14.5)
WBC: 10.3 10*3/uL (ref 3.6–11.0)

## 2014-06-12 LAB — COMPREHENSIVE METABOLIC PANEL
ALBUMIN: 3.4 g/dL (ref 3.4–5.0)
ANION GAP: 6 — AB (ref 7–16)
Alkaline Phosphatase: 123 U/L — ABNORMAL HIGH
BILIRUBIN TOTAL: 0.3 mg/dL (ref 0.2–1.0)
BUN: 13 mg/dL (ref 7–18)
CO2: 23 mmol/L (ref 21–32)
Calcium, Total: 8.8 mg/dL (ref 8.5–10.1)
Chloride: 109 mmol/L — ABNORMAL HIGH (ref 98–107)
Creatinine: 0.88 mg/dL (ref 0.60–1.30)
EGFR (African American): >60
EGFR (Non-African Amer.): >60
Glucose: 87 mg/dL (ref 65–99)
Osmolality: 275 (ref 275–301)
Potassium: 4.2 mmol/L (ref 3.5–5.1)
SGOT(AST): 15 U/L (ref 15–37)
SGPT (ALT): 16 U/L (ref 12–78)
Sodium: 138 mmol/L (ref 136–145)
Total Protein: 7.5 g/dL (ref 6.4–8.2)

## 2014-06-12 LAB — LIPASE, BLOOD: LIPASE: 97 U/L (ref 73–393)

## 2014-06-19 ENCOUNTER — Other Ambulatory Visit: Payer: Self-pay | Admitting: Gastroenterology

## 2014-06-19 DIAGNOSIS — R197 Diarrhea, unspecified: Secondary | ICD-10-CM | POA: Diagnosis not present

## 2014-06-19 DIAGNOSIS — K3189 Other diseases of stomach and duodenum: Secondary | ICD-10-CM | POA: Diagnosis not present

## 2014-06-19 DIAGNOSIS — R1013 Epigastric pain: Secondary | ICD-10-CM | POA: Diagnosis not present

## 2014-06-20 ENCOUNTER — Observation Stay: Payer: Self-pay | Admitting: Internal Medicine

## 2014-06-20 DIAGNOSIS — Z880 Allergy status to penicillin: Secondary | ICD-10-CM | POA: Diagnosis not present

## 2014-06-20 DIAGNOSIS — F4310 Post-traumatic stress disorder, unspecified: Secondary | ICD-10-CM | POA: Diagnosis not present

## 2014-06-20 DIAGNOSIS — Z79899 Other long term (current) drug therapy: Secondary | ICD-10-CM | POA: Diagnosis not present

## 2014-06-20 DIAGNOSIS — N859 Noninflammatory disorder of uterus, unspecified: Secondary | ICD-10-CM | POA: Diagnosis not present

## 2014-06-20 DIAGNOSIS — Z9089 Acquired absence of other organs: Secondary | ICD-10-CM | POA: Diagnosis not present

## 2014-06-20 DIAGNOSIS — R1110 Vomiting, unspecified: Secondary | ICD-10-CM | POA: Diagnosis not present

## 2014-06-20 DIAGNOSIS — R1033 Periumbilical pain: Secondary | ICD-10-CM | POA: Diagnosis not present

## 2014-06-20 DIAGNOSIS — F909 Attention-deficit hyperactivity disorder, unspecified type: Secondary | ICD-10-CM | POA: Diagnosis not present

## 2014-06-20 DIAGNOSIS — R112 Nausea with vomiting, unspecified: Secondary | ICD-10-CM | POA: Diagnosis not present

## 2014-06-20 DIAGNOSIS — G3184 Mild cognitive impairment, so stated: Secondary | ICD-10-CM | POA: Diagnosis not present

## 2014-06-20 DIAGNOSIS — R52 Pain, unspecified: Secondary | ICD-10-CM | POA: Diagnosis not present

## 2014-06-20 DIAGNOSIS — R109 Unspecified abdominal pain: Secondary | ICD-10-CM | POA: Diagnosis not present

## 2014-06-20 DIAGNOSIS — E860 Dehydration: Secondary | ICD-10-CM | POA: Diagnosis not present

## 2014-06-20 DIAGNOSIS — Z881 Allergy status to other antibiotic agents status: Secondary | ICD-10-CM | POA: Diagnosis not present

## 2014-06-20 DIAGNOSIS — R197 Diarrhea, unspecified: Secondary | ICD-10-CM | POA: Diagnosis not present

## 2014-06-20 DIAGNOSIS — K589 Irritable bowel syndrome without diarrhea: Secondary | ICD-10-CM | POA: Diagnosis not present

## 2014-06-20 DIAGNOSIS — K219 Gastro-esophageal reflux disease without esophagitis: Secondary | ICD-10-CM | POA: Diagnosis not present

## 2014-06-20 DIAGNOSIS — IMO0002 Reserved for concepts with insufficient information to code with codable children: Secondary | ICD-10-CM | POA: Diagnosis not present

## 2014-06-20 DIAGNOSIS — Z888 Allergy status to other drugs, medicaments and biological substances status: Secondary | ICD-10-CM | POA: Diagnosis not present

## 2014-06-20 LAB — URINALYSIS, COMPLETE
BILIRUBIN, UR: NEGATIVE
BLOOD: NEGATIVE
GLUCOSE, UR: NEGATIVE mg/dL (ref 0–75)
Ketone: NEGATIVE
Nitrite: NEGATIVE
Ph: 6 (ref 4.5–8.0)
Protein: NEGATIVE
SPECIFIC GRAVITY: 1.008 (ref 1.003–1.030)
Squamous Epithelial: 3
WBC UR: 2 /HPF (ref 0–5)

## 2014-06-20 LAB — CBC WITH DIFFERENTIAL/PLATELET
BASOS PCT: 0.8 %
Basophil #: 0.1 10*3/uL (ref 0.0–0.1)
Eosinophil #: 0.1 10*3/uL (ref 0.0–0.7)
Eosinophil %: 1.3 %
HCT: 39.9 % (ref 35.0–47.0)
HGB: 13.4 g/dL (ref 12.0–16.0)
LYMPHS ABS: 2.6 10*3/uL (ref 1.0–3.6)
Lymphocyte %: 24.6 %
MCH: 32.4 pg (ref 26.0–34.0)
MCHC: 33.6 g/dL (ref 32.0–36.0)
MCV: 96 fL (ref 80–100)
MONO ABS: 0.6 x10 3/mm (ref 0.2–0.9)
MONOS PCT: 5.3 %
Neutrophil #: 7.2 10*3/uL — ABNORMAL HIGH (ref 1.4–6.5)
Neutrophil %: 68 %
PLATELETS: 304 10*3/uL (ref 150–440)
RBC: 4.14 10*6/uL (ref 3.80–5.20)
RDW: 13.2 % (ref 11.5–14.5)
WBC: 10.6 10*3/uL (ref 3.6–11.0)

## 2014-06-20 LAB — COMPREHENSIVE METABOLIC PANEL
ALK PHOS: 129 U/L — AB
Albumin: 3.8 g/dL (ref 3.4–5.0)
Anion Gap: 7 (ref 7–16)
BUN: 8 mg/dL (ref 7–18)
Bilirubin,Total: 0.4 mg/dL (ref 0.2–1.0)
Calcium, Total: 9.2 mg/dL (ref 8.5–10.1)
Chloride: 106 mmol/L (ref 98–107)
Co2: 24 mmol/L (ref 21–32)
Creatinine: 0.9 mg/dL (ref 0.60–1.30)
EGFR (Non-African Amer.): >60
Glucose: 107 mg/dL — ABNORMAL HIGH (ref 65–99)
Osmolality: 273 (ref 275–301)
Potassium: 3.7 mmol/L (ref 3.5–5.1)
SGOT(AST): 18 U/L (ref 15–37)
SGPT (ALT): 20 U/L (ref 12–78)
SODIUM: 137 mmol/L (ref 136–145)
Total Protein: 8 g/dL (ref 6.4–8.2)

## 2014-06-20 LAB — TROPONIN I: Troponin-I: <0.02 ng/mL

## 2014-06-20 LAB — PREGNANCY, URINE: Pregnancy Test, Urine: NEGATIVE m[IU]/mL

## 2014-06-20 LAB — CLOSTRIDIUM DIFFICILE(ARMC)

## 2014-06-20 LAB — LIPASE, BLOOD: Lipase: 79 U/L (ref 73–393)

## 2014-06-21 DIAGNOSIS — E860 Dehydration: Secondary | ICD-10-CM | POA: Diagnosis not present

## 2014-06-21 DIAGNOSIS — R197 Diarrhea, unspecified: Secondary | ICD-10-CM | POA: Diagnosis not present

## 2014-06-21 DIAGNOSIS — R109 Unspecified abdominal pain: Secondary | ICD-10-CM | POA: Diagnosis not present

## 2014-06-21 DIAGNOSIS — R1110 Vomiting, unspecified: Secondary | ICD-10-CM | POA: Diagnosis not present

## 2014-06-21 DIAGNOSIS — R112 Nausea with vomiting, unspecified: Secondary | ICD-10-CM | POA: Diagnosis not present

## 2014-06-21 LAB — BASIC METABOLIC PANEL
Anion Gap: 6 — ABNORMAL LOW (ref 7–16)
BUN: 6 mg/dL — ABNORMAL LOW (ref 7–18)
Calcium, Total: 7.9 mg/dL — ABNORMAL LOW (ref 8.5–10.1)
Chloride: 106 mmol/L (ref 98–107)
Co2: 26 mmol/L (ref 21–32)
Creatinine: 0.79 mg/dL (ref 0.60–1.30)
EGFR (African American): >60
Glucose: 125 mg/dL — ABNORMAL HIGH (ref 65–99)
Osmolality: 275 (ref 275–301)
Potassium: 4 mmol/L (ref 3.5–5.1)
SODIUM: 138 mmol/L (ref 136–145)

## 2014-06-21 LAB — CBC WITH DIFFERENTIAL/PLATELET
BASOS ABS: 0 10*3/uL (ref 0.0–0.1)
Basophil %: 0.3 %
EOS PCT: 0.4 %
Eosinophil #: 0.1 10*3/uL (ref 0.0–0.7)
HCT: 37.5 % (ref 35.0–47.0)
HGB: 12.5 g/dL (ref 12.0–16.0)
LYMPHS ABS: 1 10*3/uL (ref 1.0–3.6)
LYMPHS PCT: 8.6 %
MCH: 32.3 pg (ref 26.0–34.0)
MCHC: 33.4 g/dL (ref 32.0–36.0)
MCV: 97 fL (ref 80–100)
Monocyte #: 0.5 x10 3/mm (ref 0.2–0.9)
Monocyte %: 4.7 %
Neutrophil #: 10.1 10*3/uL — ABNORMAL HIGH (ref 1.4–6.5)
Neutrophil %: 86 %
Platelet: 272 10*3/uL (ref 150–440)
RBC: 3.88 10*6/uL (ref 3.80–5.20)
RDW: 13.2 % (ref 11.5–14.5)
WBC: 11.7 10*3/uL — ABNORMAL HIGH (ref 3.6–11.0)

## 2014-06-22 DIAGNOSIS — R109 Unspecified abdominal pain: Secondary | ICD-10-CM | POA: Diagnosis not present

## 2014-06-22 DIAGNOSIS — R197 Diarrhea, unspecified: Secondary | ICD-10-CM | POA: Diagnosis not present

## 2014-06-22 DIAGNOSIS — R112 Nausea with vomiting, unspecified: Secondary | ICD-10-CM | POA: Diagnosis not present

## 2014-06-22 DIAGNOSIS — E860 Dehydration: Secondary | ICD-10-CM | POA: Diagnosis not present

## 2014-06-22 DIAGNOSIS — R1110 Vomiting, unspecified: Secondary | ICD-10-CM | POA: Diagnosis not present

## 2014-06-22 LAB — STOOL CULTURE

## 2014-06-23 DIAGNOSIS — F09 Unspecified mental disorder due to known physiological condition: Secondary | ICD-10-CM | POA: Diagnosis not present

## 2014-06-26 DIAGNOSIS — R109 Unspecified abdominal pain: Secondary | ICD-10-CM | POA: Diagnosis not present

## 2014-06-26 DIAGNOSIS — R112 Nausea with vomiting, unspecified: Secondary | ICD-10-CM | POA: Diagnosis not present

## 2014-06-26 DIAGNOSIS — R1084 Generalized abdominal pain: Secondary | ICD-10-CM | POA: Insufficient documentation

## 2014-06-26 DIAGNOSIS — K3184 Gastroparesis: Secondary | ICD-10-CM | POA: Insufficient documentation

## 2014-06-27 DIAGNOSIS — F330 Major depressive disorder, recurrent, mild: Secondary | ICD-10-CM | POA: Diagnosis not present

## 2014-07-03 ENCOUNTER — Ambulatory Visit: Payer: Self-pay | Admitting: Gastroenterology

## 2014-07-03 DIAGNOSIS — R1013 Epigastric pain: Secondary | ICD-10-CM | POA: Diagnosis not present

## 2014-07-03 DIAGNOSIS — R112 Nausea with vomiting, unspecified: Secondary | ICD-10-CM | POA: Diagnosis not present

## 2014-07-03 DIAGNOSIS — R1084 Generalized abdominal pain: Secondary | ICD-10-CM | POA: Diagnosis not present

## 2014-07-03 DIAGNOSIS — K3189 Other diseases of stomach and duodenum: Secondary | ICD-10-CM | POA: Diagnosis not present

## 2014-07-03 DIAGNOSIS — F909 Attention-deficit hyperactivity disorder, unspecified type: Secondary | ICD-10-CM | POA: Diagnosis not present

## 2014-07-03 DIAGNOSIS — F3289 Other specified depressive episodes: Secondary | ICD-10-CM | POA: Diagnosis not present

## 2014-07-03 DIAGNOSIS — Z79899 Other long term (current) drug therapy: Secondary | ICD-10-CM | POA: Diagnosis not present

## 2014-07-03 DIAGNOSIS — Z880 Allergy status to penicillin: Secondary | ICD-10-CM | POA: Diagnosis not present

## 2014-07-03 DIAGNOSIS — Z881 Allergy status to other antibiotic agents status: Secondary | ICD-10-CM | POA: Diagnosis not present

## 2014-07-03 DIAGNOSIS — F4310 Post-traumatic stress disorder, unspecified: Secondary | ICD-10-CM | POA: Diagnosis not present

## 2014-07-03 DIAGNOSIS — Z888 Allergy status to other drugs, medicaments and biological substances status: Secondary | ICD-10-CM | POA: Diagnosis not present

## 2014-07-03 DIAGNOSIS — J45909 Unspecified asthma, uncomplicated: Secondary | ICD-10-CM | POA: Diagnosis not present

## 2014-07-03 LAB — TSH: Thyroid Stimulating Horm: 1.72 u[IU]/mL

## 2014-07-03 LAB — HEMOGLOBIN A1C: Hemoglobin A1C: 5.6 % (ref 4.2–6.3)

## 2014-07-05 LAB — PATHOLOGY REPORT

## 2014-07-06 DIAGNOSIS — K3184 Gastroparesis: Secondary | ICD-10-CM | POA: Diagnosis not present

## 2014-07-06 DIAGNOSIS — R300 Dysuria: Secondary | ICD-10-CM | POA: Diagnosis not present

## 2014-07-12 ENCOUNTER — Encounter: Payer: Self-pay | Admitting: Internal Medicine

## 2014-07-20 DIAGNOSIS — K5289 Other specified noninfective gastroenteritis and colitis: Secondary | ICD-10-CM | POA: Diagnosis not present

## 2014-07-25 DIAGNOSIS — F330 Major depressive disorder, recurrent, mild: Secondary | ICD-10-CM | POA: Diagnosis not present

## 2014-07-28 ENCOUNTER — Emergency Department: Payer: Self-pay | Admitting: Emergency Medicine

## 2014-07-28 DIAGNOSIS — T7421XA Adult sexual abuse, confirmed, initial encounter: Secondary | ICD-10-CM | POA: Diagnosis not present

## 2014-07-28 DIAGNOSIS — N390 Urinary tract infection, site not specified: Secondary | ICD-10-CM | POA: Diagnosis not present

## 2014-07-28 DIAGNOSIS — R609 Edema, unspecified: Secondary | ICD-10-CM | POA: Diagnosis not present

## 2014-07-28 DIAGNOSIS — R300 Dysuria: Secondary | ICD-10-CM | POA: Diagnosis not present

## 2014-07-28 DIAGNOSIS — N949 Unspecified condition associated with female genital organs and menstrual cycle: Secondary | ICD-10-CM | POA: Diagnosis not present

## 2014-07-28 DIAGNOSIS — IMO0002 Reserved for concepts with insufficient information to code with codable children: Secondary | ICD-10-CM | POA: Diagnosis not present

## 2014-07-28 DIAGNOSIS — Z880 Allergy status to penicillin: Secondary | ICD-10-CM | POA: Diagnosis not present

## 2014-07-30 ENCOUNTER — Emergency Department: Payer: Self-pay | Admitting: Emergency Medicine

## 2014-07-30 DIAGNOSIS — N390 Urinary tract infection, site not specified: Secondary | ICD-10-CM | POA: Diagnosis not present

## 2014-07-30 DIAGNOSIS — L738 Other specified follicular disorders: Secondary | ICD-10-CM | POA: Diagnosis not present

## 2014-07-30 DIAGNOSIS — Z880 Allergy status to penicillin: Secondary | ICD-10-CM | POA: Diagnosis not present

## 2014-07-30 DIAGNOSIS — R300 Dysuria: Secondary | ICD-10-CM | POA: Diagnosis not present

## 2014-07-30 DIAGNOSIS — L678 Other hair color and hair shaft abnormalities: Secondary | ICD-10-CM | POA: Diagnosis not present

## 2014-07-30 LAB — URINALYSIS, COMPLETE
BLOOD: NEGATIVE
Bacteria: NONE SEEN
Bilirubin,UR: NEGATIVE
Glucose,UR: NEGATIVE mg/dL (ref 0–75)
Ketone: NEGATIVE
Leukocyte Esterase: NEGATIVE
NITRITE: POSITIVE
PH: 7 (ref 4.5–8.0)
Protein: NEGATIVE
RBC,UR: 2 /HPF (ref 0–5)
Specific Gravity: 1.011 (ref 1.003–1.030)
Squamous Epithelial: 2
WBC UR: 1 /HPF (ref 0–5)

## 2014-07-30 LAB — WET PREP, GENITAL

## 2014-07-30 LAB — GC/CHLAMYDIA PROBE AMP

## 2014-07-31 DIAGNOSIS — F330 Major depressive disorder, recurrent, mild: Secondary | ICD-10-CM | POA: Diagnosis not present

## 2014-08-04 ENCOUNTER — Encounter: Payer: Self-pay | Admitting: Internal Medicine

## 2014-08-04 DIAGNOSIS — F330 Major depressive disorder, recurrent, mild: Secondary | ICD-10-CM | POA: Diagnosis not present

## 2014-08-04 DIAGNOSIS — F331 Major depressive disorder, recurrent, moderate: Secondary | ICD-10-CM | POA: Diagnosis not present

## 2014-08-07 DIAGNOSIS — B373 Candidiasis of vulva and vagina: Secondary | ICD-10-CM | POA: Diagnosis not present

## 2014-08-07 DIAGNOSIS — B3731 Acute candidiasis of vulva and vagina: Secondary | ICD-10-CM | POA: Diagnosis not present

## 2014-08-10 DIAGNOSIS — F411 Generalized anxiety disorder: Secondary | ICD-10-CM | POA: Diagnosis not present

## 2014-08-10 DIAGNOSIS — F330 Major depressive disorder, recurrent, mild: Secondary | ICD-10-CM | POA: Diagnosis not present

## 2014-08-16 DIAGNOSIS — B373 Candidiasis of vulva and vagina: Secondary | ICD-10-CM | POA: Diagnosis not present

## 2014-08-16 DIAGNOSIS — N898 Other specified noninflammatory disorders of vagina: Secondary | ICD-10-CM | POA: Diagnosis not present

## 2014-08-16 DIAGNOSIS — N949 Unspecified condition associated with female genital organs and menstrual cycle: Secondary | ICD-10-CM | POA: Diagnosis not present

## 2014-08-16 DIAGNOSIS — L293 Anogenital pruritus, unspecified: Secondary | ICD-10-CM | POA: Diagnosis not present

## 2014-08-16 DIAGNOSIS — Z1329 Encounter for screening for other suspected endocrine disorder: Secondary | ICD-10-CM | POA: Diagnosis not present

## 2014-08-16 DIAGNOSIS — N9489 Other specified conditions associated with female genital organs and menstrual cycle: Secondary | ICD-10-CM | POA: Diagnosis not present

## 2014-08-16 DIAGNOSIS — R3989 Other symptoms and signs involving the genitourinary system: Secondary | ICD-10-CM | POA: Diagnosis not present

## 2014-08-16 DIAGNOSIS — N951 Menopausal and female climacteric states: Secondary | ICD-10-CM | POA: Diagnosis not present

## 2014-08-16 DIAGNOSIS — E065 Other chronic thyroiditis: Secondary | ICD-10-CM | POA: Diagnosis not present

## 2014-08-16 DIAGNOSIS — B3731 Acute candidiasis of vulva and vagina: Secondary | ICD-10-CM | POA: Diagnosis not present

## 2014-08-18 DIAGNOSIS — Z79899 Other long term (current) drug therapy: Secondary | ICD-10-CM | POA: Diagnosis not present

## 2014-08-18 DIAGNOSIS — F259 Schizoaffective disorder, unspecified: Secondary | ICD-10-CM | POA: Diagnosis not present

## 2014-08-29 DIAGNOSIS — F259 Schizoaffective disorder, unspecified: Secondary | ICD-10-CM | POA: Diagnosis not present

## 2014-08-29 DIAGNOSIS — Z23 Encounter for immunization: Secondary | ICD-10-CM | POA: Diagnosis not present

## 2014-09-06 DIAGNOSIS — F70 Mild intellectual disabilities: Secondary | ICD-10-CM | POA: Diagnosis not present

## 2014-09-06 DIAGNOSIS — F4310 Post-traumatic stress disorder, unspecified: Secondary | ICD-10-CM | POA: Diagnosis not present

## 2014-09-06 DIAGNOSIS — Z79899 Other long term (current) drug therapy: Secondary | ICD-10-CM | POA: Diagnosis not present

## 2014-09-06 DIAGNOSIS — F339 Major depressive disorder, recurrent, unspecified: Secondary | ICD-10-CM | POA: Diagnosis not present

## 2014-10-09 DIAGNOSIS — F4312 Post-traumatic stress disorder, chronic: Secondary | ICD-10-CM | POA: Diagnosis not present

## 2014-10-09 DIAGNOSIS — F3340 Major depressive disorder, recurrent, in remission, unspecified: Secondary | ICD-10-CM | POA: Diagnosis not present

## 2014-10-09 DIAGNOSIS — Z79899 Other long term (current) drug therapy: Secondary | ICD-10-CM | POA: Diagnosis not present

## 2014-10-09 DIAGNOSIS — F4310 Post-traumatic stress disorder, unspecified: Secondary | ICD-10-CM | POA: Diagnosis not present

## 2014-10-09 DIAGNOSIS — F70 Mild intellectual disabilities: Secondary | ICD-10-CM | POA: Diagnosis not present

## 2014-11-11 ENCOUNTER — Other Ambulatory Visit: Payer: Self-pay | Admitting: Internal Medicine

## 2014-11-13 NOTE — Telephone Encounter (Signed)
Last OV 4.20.15.  please advise refill

## 2015-02-07 ENCOUNTER — Ambulatory Visit: Payer: Self-pay | Admitting: Pain Medicine

## 2015-02-07 DIAGNOSIS — M791 Myalgia: Secondary | ICD-10-CM | POA: Diagnosis not present

## 2015-02-07 DIAGNOSIS — M5416 Radiculopathy, lumbar region: Secondary | ICD-10-CM | POA: Diagnosis not present

## 2015-02-07 DIAGNOSIS — M47817 Spondylosis without myelopathy or radiculopathy, lumbosacral region: Secondary | ICD-10-CM | POA: Diagnosis not present

## 2015-02-19 ENCOUNTER — Ambulatory Visit: Payer: Self-pay | Admitting: Pain Medicine

## 2015-03-06 ENCOUNTER — Ambulatory Visit: Payer: Self-pay | Admitting: Pain Medicine

## 2015-03-14 ENCOUNTER — Ambulatory Visit
Admit: 2015-03-14 | Disposition: A | Discharge status: Home / Self Care | Payer: Self-pay | Attending: Pain Medicine | Admitting: Pain Medicine

## 2015-03-22 ENCOUNTER — Emergency Department: Admit: 2015-03-22 | Disposition: A | Discharge status: Home / Self Care | Payer: Self-pay | Admitting: Internal Medicine

## 2015-03-30 NOTE — Op Note (Signed)
PATIENT NAME:  Jamie Ross, Jamie Ross MR#:  [id number] DATE OF BIRTH:  1986-07-13  DATE OF PROCEDURE:  03/15/2013  PREOPERATIVE DIAGNOSES: Dysmenorrhea; irregular menses.   POSTOPERATIVE DIAGNOSES: Dysmenorrhea; irregular menses.   PROCEDURES PERFORMED: Diagnostic hysteroscopy, dilatation and curettage, NovaSure endometrial ablation, Pap smear.   ANESTHESIA: General.  SURGEON: Dr. Donzetta Matters.    ESTIMATED BLOOD LOSS: 100 mL.   COMPLICATIONS: None.   FINDINGS: Normal-appearing endometrium; normal-appearing cervix.   SPECIMENS: Endometrial curettings, Pap smears.   INDICATIONS: The patient is a 29 year old who presents for a Pap smear and NovaSure endometrial ablation secondary to irregular menses uncontrolled by hormonal methods. The risks, benefits, indications, and alternatives of the procedure were explained to the patient's parents. The patient does have some mental retardation, and informed consent was obtained.   PROCEDURE: The patient was taken to the operating room with IV fluids running. She was prepped and draped in the usual sterile fashion in candy-cane stirrups. A speculum was placed inside of the vagina. The anterior lip of the cervix was grasped with a single-toothed tenaculum. The Pap smear was then collected. The uterus was sounded to 9 cm. The cervix was dilated using Hagar dilators up to #8.   A diagnostic hysteroscope was placed and findings were as previously stated. A D and C was performed using a serrated-edged banjo curette. The hysteroscope was then replaced and the uterine cavity evaluated. The hysteroscope was removed and the NovaSure apparatus was placed inside of the uterus. The ablation was performed according to manufacturer's instructions. The uterine sound length was 9 cm. The cervical length was 3.5 cm, leaving a cavity length of 5.5 cm. After the NovaSure apparatus was placed maneuvers were performed and the cavity width was 4 cm. The hysteroscope was  then replaced to evaluate the post-ablated endometrium. All instrumentation was removed from the patient's uterus. The cervix had been slightly lacerated secondary to trauma from the single-toothed tenacula on both the anterior and posterior lips. These areas were repaired with #4-0 Vicryl suture. There was no bleeding after the procedure and the cervical repair. The patient tolerated the procedure well. Sponge, needle and instrument counts were correct x 2 and the patient was taken to the recovery room in stable condition.     ____________________________ Rolm Gala Ferne Reus, MD law:dm D: 03/15/2013 09:07:00 ET T: 03/15/2013 09:26:13 ET JOB#: [id number]  cc: Sherlynn Carbon A. Ferne Reus, MD, <Dictator> Rolm Gala WEAVER LEE MD ELECTRONICALLY SIGNED 03/20/2013 23:12

## 2015-03-31 NOTE — Op Note (Signed)
PATIENT NAME:  Jamie Ross, Jamie Ross MR#:  [id number] DATE OF BIRTH:  27-Jul-1986  DATE OF PROCEDURE:  12/15/2013  PREOPERATIVE DIAGNOSIS: Umbilical hernia.   POSTOPERATIVE DIAGNOSIS: Umbilical hernia.   PROCEDURE: Umbilical hernia repair.   SURGEON: Rochel Brome, M.D.   ANESTHESIA: General.   INDICATIONS: This 29 year old female has had umbilical pain and had CT findings of an umbilical hernia and also had physical findings of an umbilical hernia, which was small in size, and surgery was recommended due to the pain.   DESCRPITION OF PROCEDURE: The patient was placed on the operating table in the supine position under general anesthesia. The abdomen was prepared with ChloraPrep and draped in a sterile manner.   The bulge in the umbilicus was palpated and was reduced. The bulge itself was approximately 1.5 cm. A transversely oriented supraumbilical incision was made some 2.5 cm in length, carried down through subcutaneous tissues to encounter herniated properitoneal fat, which was dissected free from surrounding structures, dissected free from the skin of the umbilicus, followed down to the fascial ring defect and was separated from the fascial ring defect and was inverted. The fascial ring defect was approximately 1.2 cm in dimension. The fascia was somewhat thin on the upper side and I did elect to use a properitoneal bard soft mesh. The mesh was cut to create oval shape of approximately 1.4 x 1.8 cm and placed transversely oriented in the properitoneal plane and was sutured to the fascia superiorly and inferiorly with through and through 0 Surgilon sutures.   Next, the repair was carried out with our transversely oriented suture line of interrupted 0 Surgilon figure-of-eight sutures incorporating mesh into each suture. The repair looked good. Hemostasis was intact. It is noted that a number of small bleeding points were cauterized during the course of the procedure. Next, a 5-0 Monocryl pursestring  suture was placed in the subcutaneous tissues to approximate and also tether the skin of the umbilicus. Next, the incision was closed with a running 5-0 Monocryl subcuticular suture and Dermabond. The patient tolerated surgery satisfactorily and was prepared for transfer to the recovery room.   ____________________________ Lenna Sciara. Rochel Brome, MD jws:aw D: 12/15/2013 10:40:41 ET T: 12/15/2013 11:08:25 ET JOB#: [id number]  cc: Loreli Dollar, MD, <Dictator> Loreli Dollar MD ELECTRONICALLY SIGNED 12/15/2013 19:55

## 2015-03-31 NOTE — Consult Note (Signed)
Brief Consult Note: Diagnosis: developmental disability with behavior dyscontrol.   Patient was seen by consultant.   Consult note dictated.   Comments: PSychiatry: PAtient seen and parents interviewd. Will dictate full note. Currently she is not getting the Brintillex and behavior here reported to be fine. I advise they STAY OFF Brintillix until they see Dr Leonides Schanz for scheduled followup this upcoming tuesday. If behavior escalates I suggest they try extra prn chlorpromazine though I hope that wont be needed.  Electronic Signatures: Gonzella Lex (MD)  (Signed 16-Jul-15 12:45)  Authored: Brief Consult Note   Last Updated: 16-Jul-15 12:45 by Gonzella Lex (MD)
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
## 2015-03-31 NOTE — Consult Note (Signed)
Brief Consult Note: Diagnosis: PTSD developmental disability.   Patient was seen by consultant.   Recommend further assessment or treatment.   Comments: Psychiatry: Came by to see patient at just the time, it seems, the parents are not here. I called their home number and left a voice mail. I'll come followup tomorrow. I see Brintillix is already not being given, which is fine.  Electronic Signatures: Gonzella Lex (MD)  (Signed 15-Jul-15 20:18)  Authored: Brief Consult Note   Last Updated: 15-Jul-15 20:18 by Gonzella Lex (MD)

## 2015-03-31 NOTE — Consult Note (Signed)
Brief Consult Note: Diagnosis: Nausea, vomiting, diarrhea.   Patient was seen by consultant.   Comments: Ms. Putnam is a pleasant 29 y/o caucasian female with mental challenges who resides with her parents who reported a 3 week hx of abdominal pain, nausea, vomiting & diarrhea.  CT A/P, pelvic ultrasound & KUB all benign.  C diff PCR & stool culture negative so far.  Agree with empiric antibiotics for infectious diarrhea.  If diarrhea & nausea persist, next step would be colonoscopy & EGD for further evaluation to look for inflammatory bowel disease or microscopic colitis.  Continue supportive measures & will reevaluate in the morning.  Thanks for allowing [place] to participate in her care.  Please see full dictated note. #[id number].  Electronic Signatures: Andria Meuse (NP)  (Signed 14-Jul-15 23:14)  Authored: Brief Consult Note   Last Updated: 14-Jul-15 23:14 by Andria Meuse (NP)

## 2015-03-31 NOTE — Consult Note (Signed)
Chief Complaint:  Subjective/Chief Complaint RN states "dry heaves" but no emesis.  No futher diarrhea.  Pt denies abdominal pain currently.   VITAL SIGNS/ANCILLARY NOTES: **Vital Signs.:   15-Jul-15 05:50  Vital Signs Type Routine  Temperature Temperature (F) 97.3  Celsius 36.2  Temperature Source oral  Pulse Pulse 120  Respirations Respirations 18  Systolic BP Systolic BP 99  Diastolic BP (mmHg) Diastolic BP (mmHg) 58  Mean BP 71  Pulse Ox % Pulse Ox % 92  Pulse Ox Activity Level  At rest  Oxygen Delivery Room Air/ 21 %    09:45  Pulse Pulse 108  Systolic BP Systolic BP 133  Diastolic BP (mmHg) Diastolic BP (mmHg) 79  Mean BP 97    14:18  Vital Signs Type Routine  Temperature Temperature (F) 98.5  Celsius 36.9  Temperature Source oral  Pulse Pulse 105  Respirations Respirations 16  Systolic BP Systolic BP 104  Diastolic BP (mmHg) Diastolic BP (mmHg) 69  Mean BP 80  Pulse Ox % Pulse Ox % 90  Pulse Ox Activity Level  At rest  Oxygen Delivery Room Air/ 21 %   Brief Assessment:  GEN well nourished, no acute distress, A/Ox3, mentally challenged, parents at bedside   Cardiac Regular   Respiratory normal resp effort   Gastrointestinal Normal   Gastrointestinal details normal Soft  Nontender  Nondistended  Bowel sounds normal  No rebound tenderness  No gaurding  No rigidity   EXTR negative cyanosis/clubbing, negative edema   Additional Physical Exam Skin: pink, warm ,dry   Lab Results:  Routine Chem:  15-Jul-15 06:06   Glucose, Serum  125  BUN  6  Creatinine (comp) 0.79  Sodium, Serum 138  Potassium, Serum 4.0  Chloride, Serum 106  CO2, Serum 26  Calcium (Total), Serum  7.9  Anion Gap  6  Osmolality (calc) 275  eGFR (African American) >60  eGFR (Non-African American) >60 (eGFR values <94mL/min/1.73 m2 may be an indication of chronic kidney disease (CKD). Calculated eGFR is useful in patients with stable renal function. The eGFR calculation will not be  reliable in acutely ill patients when serum creatinine is changing rapidly. It is not useful in  patients on dialysis. The eGFR calculation may not be applicable to patients at the low and high extremes of body sizes, pregnant women, and vegetarians.)  Routine Hem:  15-Jul-15 06:06   WBC (CBC)  11.7  RBC (CBC) 3.88  Hemoglobin (CBC) 12.5  Hematocrit (CBC) 37.5  Platelet Count (CBC) 272  MCV 97  MCH 32.3  MCHC 33.4  RDW 13.2  Neutrophil % 86.0  Lymphocyte % 8.6  Monocyte % 4.7  Eosinophil % 0.4  Basophil % 0.3  Neutrophil #  10.1  Lymphocyte # 1.0  Monocyte # 0.5  Eosinophil # 0.1  Basophil # 0.0 (Result(s) reported on 21 Jun 2014 at 06:18AM.)   Assessment/Plan:  Assessment/Plan:  Assessment Chronic N/V likely secondary to gastroparesis:  Dry heaves currently.  Hx abnormal GES.  Unfortunately no great treatment for this.  She is on low dose reglan.  Advance  gastroparesis diet. Diarrhea: Resolved.  On empiric antibiotics.  ?medication side effect, microscopic colitis, IBS   Plan 1) Continue supportive measures 2) Advance to gastroparesis diet 3) If diarrhea & vomiting persist, colonoscopy & EGD, may be done as outpatient if she continues to do well Please call if you have any questions or concerns   Electronic Signatures: Joselyn Arrow (NP)  (Signed 15-Jul-15 15:32)  Authored: Chief Complaint, VITAL SIGNS/ANCILLARY NOTES, Brief Assessment, Lab Results, Assessment/Plan   Last Updated: 15-Jul-15 15:32 by Andria Meuse (NP)
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
## 2015-03-31 NOTE — Consult Note (Signed)
Chief Complaint:  Subjective/Chief Complaint RN states no witnessed vomiting or diarrhea this admission.  Pt eating burger for lunch.  Pt states "my belly hurts 10/10"   Brief Assessment:  GEN well nourished, no acute distress, A/Ox3, mentally challenged, parents at bedside   Cardiac Regular   Respiratory normal resp effort   Gastrointestinal Normal   Gastrointestinal details normal Soft  Nontender  Nondistended  Bowel sounds normal  No rebound tenderness  No gaurding  No rigidity   EXTR negative cyanosis/clubbing, negative edema   Additional Physical Exam Skin: pink, warm ,dry   Assessment/Plan:  Assessment/Plan:  Assessment Chronic N/V likely secondary to gastroparesis:  No emesis, tolerating regular diet.   Diarrhea: Resolved.   Plan 1) Continue supportive measures 2) continue reglan, PPI 3) dietician teaching on gastroparesis diet Please call if you have any questions or concerns   Electronic Signatures: Andria Meuse (NP)  (Signed 16-Jul-15 14:54)  Authored: Chief Complaint, Brief Assessment, Assessment/Plan   Last Updated: 16-Jul-15 14:54 by Andria Meuse (NP)
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
## 2015-04-11 ENCOUNTER — Other Ambulatory Visit: Payer: Self-pay | Admitting: Physician Assistant

## 2015-04-11 DIAGNOSIS — N644 Mastodynia: Secondary | ICD-10-CM

## 2015-04-13 ENCOUNTER — Ambulatory Visit
Admission: RE | Admit: 2015-04-13 | Discharge: 2015-04-13 | Disposition: A | Discharge status: Home / Self Care | Payer: Medicare Other | Source: Ambulatory Visit | Attending: Physician Assistant | Admitting: Physician Assistant

## 2015-04-13 ENCOUNTER — Ambulatory Visit: Payer: Medicare Other

## 2015-04-13 ENCOUNTER — Other Ambulatory Visit: Payer: Self-pay | Admitting: Physician Assistant

## 2015-04-13 DIAGNOSIS — N644 Mastodynia: Secondary | ICD-10-CM | POA: Insufficient documentation

## 2015-04-17 ENCOUNTER — Encounter: Payer: Self-pay | Admitting: *Deleted

## 2015-04-17 ENCOUNTER — Ambulatory Visit: Payer: Medicare Other | Admitting: Pain Medicine

## 2015-04-17 ENCOUNTER — Emergency Department
Admission: EM | Admit: 2015-04-17 | Discharge: 2015-04-17 | Disposition: A | Discharge status: Home / Self Care | Payer: Medicare Other | Attending: Emergency Medicine | Admitting: Emergency Medicine

## 2015-04-17 ENCOUNTER — Emergency Department: Payer: Medicare Other

## 2015-04-17 DIAGNOSIS — R109 Unspecified abdominal pain: Secondary | ICD-10-CM | POA: Diagnosis not present

## 2015-04-17 DIAGNOSIS — Z880 Allergy status to penicillin: Secondary | ICD-10-CM | POA: Diagnosis not present

## 2015-04-17 DIAGNOSIS — J45909 Unspecified asthma, uncomplicated: Secondary | ICD-10-CM | POA: Insufficient documentation

## 2015-04-17 DIAGNOSIS — Z791 Long term (current) use of non-steroidal anti-inflammatories (NSAID): Secondary | ICD-10-CM | POA: Insufficient documentation

## 2015-04-17 DIAGNOSIS — B379 Candidiasis, unspecified: Secondary | ICD-10-CM | POA: Diagnosis not present

## 2015-04-17 DIAGNOSIS — Z792 Long term (current) use of antibiotics: Secondary | ICD-10-CM | POA: Diagnosis not present

## 2015-04-17 DIAGNOSIS — Z3202 Encounter for pregnancy test, result negative: Secondary | ICD-10-CM | POA: Insufficient documentation

## 2015-04-17 DIAGNOSIS — R0902 Hypoxemia: Secondary | ICD-10-CM | POA: Diagnosis present

## 2015-04-17 DIAGNOSIS — Z79899 Other long term (current) drug therapy: Secondary | ICD-10-CM | POA: Diagnosis not present

## 2015-04-17 LAB — URINALYSIS COMPLETE WITH MICROSCOPIC (ARMC ONLY)
BILIRUBIN URINE: NEGATIVE
GLUCOSE, UA: NEGATIVE mg/dL
Hgb urine dipstick: NEGATIVE
KETONES UR: NEGATIVE mg/dL
Nitrite: NEGATIVE
PROTEIN: NEGATIVE mg/dL
Specific Gravity, Urine: 1.017 (ref 1.005–1.030)
WBC, UA: NONE SEEN WBC/hpf (ref 0–5)
pH: 7 (ref 5.0–8.0)

## 2015-04-17 LAB — CBC WITH DIFFERENTIAL/PLATELET
BASOS ABS: 0.1 10*3/uL (ref 0–0.1)
Basophils Relative: 1 %
EOS ABS: 0.2 10*3/uL (ref 0–0.7)
Eosinophils Relative: 3 %
HCT: 40.3 % (ref 35.0–47.0)
Hemoglobin: 13.4 g/dL (ref 12.0–16.0)
Lymphocytes Relative: 35 %
Lymphs Abs: 2.9 10*3/uL (ref 1.0–3.6)
MCH: 32.3 pg (ref 26.0–34.0)
MCHC: 33.3 g/dL (ref 32.0–36.0)
MCV: 97.1 fL (ref 80.0–100.0)
Monocytes Absolute: 0.6 10*3/uL (ref 0.2–0.9)
Monocytes Relative: 8 %
Neutro Abs: 4.6 10*3/uL (ref 1.4–6.5)
Neutrophils Relative %: 55 %
PLATELETS: 253 10*3/uL (ref 150–440)
RBC: 4.15 MIL/uL (ref 3.80–5.20)
RDW: 13.6 % (ref 11.5–14.5)
WBC: 8.4 10*3/uL (ref 3.6–11.0)

## 2015-04-17 LAB — COMPREHENSIVE METABOLIC PANEL
ALBUMIN: 4.1 g/dL (ref 3.5–5.0)
ALT: 13 U/L — ABNORMAL LOW (ref 14–54)
AST: 16 U/L (ref 15–41)
Alkaline Phosphatase: 110 U/L (ref 38–126)
Anion gap: 4 — ABNORMAL LOW (ref 5–15)
BUN: 11 mg/dL (ref 6–20)
CO2: 29 mmol/L (ref 22–32)
CREATININE: 0.9 mg/dL (ref 0.44–1.00)
Calcium: 8.8 mg/dL — ABNORMAL LOW (ref 8.9–10.3)
Chloride: 106 mmol/L (ref 101–111)
GFR calc non Af Amer: >60 mL/min (ref >60)
GLUCOSE: 103 mg/dL — AB (ref 65–99)
Potassium: 3.6 mmol/L (ref 3.5–5.1)
Sodium: 139 mmol/L (ref 135–145)
TOTAL PROTEIN: 7.4 g/dL (ref 6.5–8.1)
Total Bilirubin: 0.3 mg/dL (ref 0.3–1.2)

## 2015-04-17 LAB — POCT PREGNANCY, URINE: Preg Test, Ur: NEGATIVE

## 2015-04-17 MED ORDER — FLUCONAZOLE 150 MG PO TABS: 150.0000 mg | ORAL_TABLET | Freq: Every day | ORAL | Status: DC
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
## 2015-04-17 NOTE — ED Notes (Signed)
C/o abd pain past few days, had small bm yetserday, went to [facility], pulse oxi was low, but normal here, has had some sob, chest tightness

## 2015-04-17 NOTE — Discharge Instructions (Signed)
Please seek medical attention for any high fevers, chest pain, shortness of breath, change in behavior, persistent vomiting, bloody stool or any other new or concerning symptoms.  Candida Infection A Candida infection (also called yeast, fungus, and Monilia infection) is an overgrowth of yeast that can occur anywhere on the body. A yeast infection commonly occurs in warm, moist body areas. Usually, the infection remains localized but can spread to become a systemic infection. A yeast infection may be a sign of a more severe disease such as diabetes, leukemia, or AIDS. A yeast infection can occur in both men and women. In women, Candida vaginitis is a vaginal infection. It is one of the most common causes of vaginitis. Men usually do not have symptoms or know they have an infection until other problems develop. Men may find out they have a yeast infection because their sex partner has a yeast infection. Uncircumcised men are more likely to get a yeast infection than circumcised men. This is because the uncircumcised glans is not exposed to air and does not remain as dry as that of a circumcised glans. Older adults may develop yeast infections around dentures. CAUSES  Women  Antibiotics.  Steroid medication taken for a long time.  Being overweight (obese).  Diabetes.  Poor immune condition.  Certain serious medical conditions.  Immune suppressive medications for organ transplant patients.  Chemotherapy.  Pregnancy.  Menstruation.  Stress and fatigue.  Intravenous drug use.  Oral contraceptives.  Wearing tight-fitting clothes in the crotch area.  Catching it from a sex partner who has a yeast infection.  Spermicide.  Intravenous, urinary, or other catheters. Men  Catching it from a sex partner who has a yeast infection.  Having oral or anal sex with a person who has the infection.  Spermicide.  Diabetes.  Antibiotics.  Poor immune system.  Medications that suppress  the immune system.  Intravenous drug use.  Intravenous, urinary, or other catheters. SYMPTOMS  Women  Thick, white vaginal discharge.  Vaginal itching.  Redness and swelling in and around the vagina.  Irritation of the lips of the vagina and perineum.  Blisters on the vaginal lips and perineum.  Painful sexual intercourse.  Low blood sugar (hypoglycemia).  Painful urination.  Bladder infections.  Intestinal problems such as constipation, indigestion, bad breath, bloating, increase in gas, diarrhea, or loose stools. Men  Men may develop intestinal problems such as constipation, indigestion, bad breath, bloating, increase in gas, diarrhea, or loose stools.  Dry, cracked skin on the penis with itching or discomfort.  Jock itch.  Dry, flaky skin.  Athlete's foot.  Hypoglycemia. DIAGNOSIS  Women  A history and an exam are performed.  The discharge may be examined under a microscope.  A culture may be taken of the discharge. Men  A history and an exam are performed.  Any discharge from the penis or areas of cracked skin will be looked at under the microscope and cultured.  Stool samples may be cultured. TREATMENT  Women  Vaginal antifungal suppositories and creams.  Medicated creams to decrease irritation and itching on the outside of the vagina.  Warm compresses to the perineal area to decrease swelling and discomfort.  Oral antifungal medications.  Medicated vaginal suppositories or cream for repeated or recurrent infections.  Wash and dry the irritation areas before applying the cream.  Eating yogurt with Lactobacillus may help with prevention and treatment.  Sometimes painting the vagina with gentian violet solution may help if creams and suppositories do not  work. Men  Antifungal creams and oral antifungal medications.  Sometimes treatment must continue for 30 days after the symptoms go away to prevent recurrence. HOME CARE INSTRUCTIONS    Women  Use cotton underwear and avoid tight-fitting clothing.  Avoid colored, scented toilet paper and deodorant tampons or pads.  Do not douche.  Keep your diabetes under control.  Finish all the prescribed medications.  Keep your skin clean and dry.  Consume milk or yogurt with Lactobacillus-active culture regularly. If you get frequent yeast infections and think that is what the infection is, there are over-the-counter medications that you can get. If the infection does not show healing in 3 days, talk to your caregiver.  Tell your sex partner you have a yeast infection. Your partner may need treatment also, especially if your infection does not clear up or recurs. Men  Keep your skin clean and dry.  Keep your diabetes under control.  Finish all prescribed medications.  Tell your sex partner that you have a yeast infection so he or she can be treated if necessary. SEEK MEDICAL CARE IF:   Your symptoms do not clear up or worsen in one week after treatment.  You have an oral temperature above 102 F (38.9 C).  You have trouble swallowing or eating for a prolonged time.  You develop blisters on and around your vagina.  You develop vaginal bleeding and it is not your menstrual period.  You develop abdominal pain.  You develop intestinal problems as mentioned above.  You get weak or light-headed.  You have painful or increased urination.  You have pain during sexual intercourse. MAKE SURE YOU:   Understand these instructions.  Will watch your condition.  Will get help right away if you are not doing well or get worse. Document Released: 01/01/2005 Document Revised: 04/10/2014 Document Reviewed: 04/15/2010 [facility] Patient Information 2015 [place], [place]. This information is not intended to replace advice given to you by your health care provider. Make sure you discuss any questions you have with your health care provider.

## 2015-04-17 NOTE — ED Notes (Signed)
Pt has Hx of Trisomy 21 here with c/o abd pain, respiratory distress, and not feeling well.  Pt seen by PCP and referred here d/t low O2 sats.  Pt remains 96-100% Pox in triage.

## 2015-04-22 ENCOUNTER — Other Ambulatory Visit: Payer: Self-pay | Admitting: Pain Medicine

## 2015-04-22 ENCOUNTER — Encounter: Payer: Self-pay | Admitting: Pain Medicine

## 2015-04-22 DIAGNOSIS — G5700 Lesion of sciatic nerve, unspecified lower limb: Secondary | ICD-10-CM | POA: Insufficient documentation

## 2015-04-22 DIAGNOSIS — M47899 Other spondylosis, site unspecified: Secondary | ICD-10-CM

## 2015-04-22 DIAGNOSIS — M47819 Spondylosis without myelopathy or radiculopathy, site unspecified: Secondary | ICD-10-CM

## 2015-04-22 DIAGNOSIS — M533 Sacrococcygeal disorders, not elsewhere classified: Secondary | ICD-10-CM

## 2015-04-22 DIAGNOSIS — M5136 Other intervertebral disc degeneration, lumbar region: Secondary | ICD-10-CM

## 2015-04-22 DIAGNOSIS — M47816 Spondylosis without myelopathy or radiculopathy, lumbar region: Secondary | ICD-10-CM

## 2015-04-23 ENCOUNTER — Encounter: Payer: Self-pay | Admitting: Pain Medicine

## 2015-04-23 ENCOUNTER — Ambulatory Visit: Payer: Medicare Other | Attending: Pain Medicine | Admitting: Pain Medicine

## 2015-04-23 VITALS — BP 95/56 | HR 87 | Temp 98.1°F | Resp 14 | Ht 61.0 in | Wt 197.0 lb

## 2015-04-23 DIAGNOSIS — M79604 Pain in right leg: Secondary | ICD-10-CM | POA: Insufficient documentation

## 2015-04-23 DIAGNOSIS — M79605 Pain in left leg: Secondary | ICD-10-CM | POA: Diagnosis not present

## 2015-04-23 DIAGNOSIS — G5700 Lesion of sciatic nerve, unspecified lower limb: Secondary | ICD-10-CM | POA: Insufficient documentation

## 2015-04-23 DIAGNOSIS — M545 Low back pain: Secondary | ICD-10-CM | POA: Diagnosis present

## 2015-04-23 DIAGNOSIS — M797 Fibromyalgia: Secondary | ICD-10-CM | POA: Insufficient documentation

## 2015-04-23 DIAGNOSIS — M533 Sacrococcygeal disorders, not elsewhere classified: Secondary | ICD-10-CM | POA: Insufficient documentation

## 2015-04-23 MED ORDER — BUPIVACAINE HCL (PF) 0.25 % IJ SOLN
INTRAMUSCULAR | Status: AC
  Administered 2015-04-23: 10:00:00
  Filled 2015-04-23: qty 30

## 2015-04-23 MED ORDER — MIDAZOLAM HCL 5 MG/5ML IJ SOLN
INTRAMUSCULAR | Status: AC
  Administered 2015-04-23: 4 mg via INTRAVENOUS
  Filled 2015-04-23: qty 5

## 2015-04-23 MED ORDER — FENTANYL CITRATE (PF) 100 MCG/2ML IJ SOLN
INTRAMUSCULAR | Status: AC
  Administered 2015-04-23: 100 ug via INTRAVENOUS
  Filled 2015-04-23: qty 2

## 2015-04-23 MED ORDER — ORPHENADRINE CITRATE 30 MG/ML IJ SOLN
INTRAMUSCULAR | Status: AC
  Filled 2015-04-23: qty 2

## 2015-04-23 MED ORDER — TRIAMCINOLONE ACETONIDE 40 MG/ML IJ SUSP
INTRAMUSCULAR | Status: AC
  Administered 2015-04-23: 10 mg
  Filled 2015-04-23: qty 1

## 2015-04-23 NOTE — Patient Instructions (Addendum)
Continue present medications.  F/U PCP for evaliation of  BP and general medical  condition.  F/U surgical evaluation.  F/U nrurological evaluation.  May consider radiofrequency rhizolysis or intraspinal procedures pending response to present treatment and F/U evaluation.  Patient to call Pain Management Center should patient have concerns prior to scheduled return appointment.   Pain Management Discharge Instructions  General Discharge Instructions :  If you need to reach your doctor call: Monday-Friday 8:00 am - 4:00 pm at [phone] or toll free [phone].  After clinic hours [phone] to have operator reach doctor.  Bring all of your medication bottles to all your appointments in the pain clinic.  To cancel or reschedule your appointment with Pain Management please remember to call 24 hours in advance to avoid a fee.  Refer to the educational materials which you have been given on: General Risks, I had my Procedure. Discharge Instructions, Post Sedation.  Post Procedure Instructions:  The drugs you were given will stay in your system until tomorrow, so for the next 24 hours you should not drive, make any legal decisions or drink any alcoholic beverages.  You may eat anything you prefer, but it is better to start with liquids then soups and crackers, and gradually work up to solid foods.  Please notify your doctor immediately if you have any unusual bleeding, trouble breathing or pain that is not related to your normal pain.  Depending on the type of procedure that was done, some parts of your body may feel week and/or numb.  This usually clears up by tonight or the next day.  Walk with the use of an assistive device or accompanied by an adult for the 24 hours.  You may use ice on the affected area for the first 24 hours.  Put ice in a Ziploc bag and cover with a towel and place against area 15 minutes on 15 minutes off.  You may switch to heat after 24 hours.

## 2015-04-23 NOTE — Progress Notes (Signed)
   Subjective:    Patient ID: Jamie Ross, female    DOB: 02-Jul-1986, 29 y.o.   MRN: [id number]  HPI    Review of Systems     Objective:   Physical Exam        Assessment & Plan:

## 2015-04-23 NOTE — Progress Notes (Signed)
   Subjective:    Patient ID: Jamie Ross, female    DOB: [birth year], 29 y.o.   MRN: [id number]  HPI    Review of Systems     Objective:   Physical Exam        Assessment & Plan:

## 2015-04-23 NOTE — Progress Notes (Signed)
PROCEDURE PERFORMED: Lumbosacral selective nerve root block   NOTE: The patient is a 29 y.o. female who returns to [facility] for further evaluation and treatment of pain involving the lumbar and lower extremity region. 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 There is concern regarding intraspinal abnormalities contributing to the patient's symptomatology. The risks, benefits, and expectations of the procedure have been explained to the patient who was understanding and in agreement with suggested treatment plan. We will proceed with interventional treatment as discussed and as explained to the patient. The patient is understanding and in agreement with suggested treatment plan.   DESCRIPTION OF PROCEDURE: Lumbosacral selective nerve root block with IV Versed, IV fentanyl conscious sedation, EKG, blood pressure, pulse, and pulse oximetry monitoring. The procedure was performed with the patient in the prone position under fluoroscopic guidance. With the patient in the prone position, Betadine prep of proposed entry site was performed. Local anesthetic skin wheal of proposed needle entry site was prepared with 1.5% plain lidocaine with AP view of the lumbosacral spine.   PROCEDURE #1: Needle placement at the L 2 vertebral body on the right side A 22 -gauge needle was inserted at the inferior border of the transverse process of the vertebral body with needle placed medial to the midline of the transverse process on AP view of the lumbosacral spine.  Repeat this technique at L3, L4, L5, .  .   Needle placement was then verified on lateral view at all levels with needle tip documented to be in the posterior superior quadrant of the intervertebral foramen of  L L2, L3, L4, L5, Following negative aspiration for heme and CSF at each level, each level was injected with 3 mL of 0.25% bupivacaine with  Kenalog. The patient tolerated the procedure well. A total of 10 mg of Kenalog was utilized for the procedure.   PLAN:  1. Medications: Will continue presently prescribed medications. 2. The patient is to undergo follow-up evaluation with PCP for evaluation of blood pressure and general medical condition status post procedure performed on today's visit. 3. Surgical follow-up evaluation. 4. Neurological evaluation. 5. May consider radiofrequency procedures, implantation type procedures and other treatment pending response to treatment and follow-up evaluation. 6. The patient has been advise do adhere to proper body mechanics and avoid activities which may aggravate condition. 7. The patient has been advised to call the [facility] prior to scheduled return appointment should there be significant change in the patient's condition or should the patient have other concerns regarding condition prior to scheduled return appointment. X-rays and MRIs

## 2015-04-23 NOTE — Progress Notes (Signed)
Discharged via w/c at 1019 am. Tolerating po fluids well. Instructions (verbal and written) given to patient. Teachback x3.

## 2015-04-23 NOTE — Progress Notes (Signed)
Procedure start time 0917 Procedure end time 0940 Report given to D. Merrilyn Puma.

## 2015-04-24 ENCOUNTER — Telehealth: Payer: Self-pay | Admitting: *Deleted

## 2015-04-24 NOTE — Telephone Encounter (Signed)
Father denies that patient is having any problems.

## 2015-04-24 NOTE — Telephone Encounter (Signed)
Closed encounter °

## 2015-04-24 NOTE — Telephone Encounter (Signed)
closed

## 2015-05-01 ENCOUNTER — Other Ambulatory Visit: Payer: Self-pay | Admitting: Pain Medicine

## 2015-05-08 DIAGNOSIS — J4541 Moderate persistent asthma with (acute) exacerbation: Secondary | ICD-10-CM | POA: Insufficient documentation

## 2015-05-13 ENCOUNTER — Other Ambulatory Visit: Payer: Self-pay | Admitting: Pain Medicine

## 2015-05-13 DIAGNOSIS — G5700 Lesion of sciatic nerve, unspecified lower limb: Secondary | ICD-10-CM

## 2015-05-13 DIAGNOSIS — M533 Sacrococcygeal disorders, not elsewhere classified: Secondary | ICD-10-CM

## 2015-05-13 DIAGNOSIS — G629 Polyneuropathy, unspecified: Secondary | ICD-10-CM

## 2015-05-13 DIAGNOSIS — M797 Fibromyalgia: Secondary | ICD-10-CM

## 2015-05-17 ENCOUNTER — Ambulatory Visit: Payer: Medicare Other | Admitting: Pain Medicine

## 2015-07-06 ENCOUNTER — Emergency Department
Admission: EM | Admit: 2015-07-06 | Discharge: 2015-07-09 | Disposition: A | Discharge status: Home / Self Care | Payer: Medicare Other | Attending: Emergency Medicine | Admitting: Emergency Medicine

## 2015-07-06 DIAGNOSIS — Z3202 Encounter for pregnancy test, result negative: Secondary | ICD-10-CM | POA: Diagnosis not present

## 2015-07-06 DIAGNOSIS — F79 Unspecified intellectual disabilities: Secondary | ICD-10-CM | POA: Insufficient documentation

## 2015-07-06 DIAGNOSIS — Z046 Encounter for general psychiatric examination, requested by authority: Secondary | ICD-10-CM | POA: Diagnosis present

## 2015-07-06 DIAGNOSIS — G894 Chronic pain syndrome: Secondary | ICD-10-CM | POA: Diagnosis not present

## 2015-07-06 DIAGNOSIS — N390 Urinary tract infection, site not specified: Secondary | ICD-10-CM | POA: Insufficient documentation

## 2015-07-06 DIAGNOSIS — Z111 Encounter for screening for respiratory tuberculosis: Secondary | ICD-10-CM

## 2015-07-06 DIAGNOSIS — F329 Major depressive disorder, single episode, unspecified: Secondary | ICD-10-CM | POA: Diagnosis not present

## 2015-07-06 DIAGNOSIS — Z79899 Other long term (current) drug therapy: Secondary | ICD-10-CM | POA: Diagnosis not present

## 2015-07-06 DIAGNOSIS — Z7951 Long term (current) use of inhaled steroids: Secondary | ICD-10-CM | POA: Insufficient documentation

## 2015-07-06 DIAGNOSIS — Z880 Allergy status to penicillin: Secondary | ICD-10-CM | POA: Insufficient documentation

## 2015-07-06 DIAGNOSIS — F32A Depression, unspecified: Secondary | ICD-10-CM

## 2015-07-06 DIAGNOSIS — F1510 Other stimulant abuse, uncomplicated: Secondary | ICD-10-CM | POA: Diagnosis not present

## 2015-07-06 LAB — COMPREHENSIVE METABOLIC PANEL
ALBUMIN: 3.9 g/dL (ref 3.5–5.0)
ALT: 10 U/L — ABNORMAL LOW (ref 14–54)
ANION GAP: 5 (ref 5–15)
AST: 13 U/L — ABNORMAL LOW (ref 15–41)
Alkaline Phosphatase: 92 U/L (ref 38–126)
BILIRUBIN TOTAL: 0.4 mg/dL (ref 0.3–1.2)
BUN: 15 mg/dL (ref 6–20)
CALCIUM: 8.6 mg/dL — AB (ref 8.9–10.3)
CO2: 23 mmol/L (ref 22–32)
Chloride: 110 mmol/L (ref 101–111)
Creatinine, Ser: 0.86 mg/dL (ref 0.44–1.00)
GFR calc non Af Amer: >60 mL/min (ref >60)
Glucose, Bld: 105 mg/dL — ABNORMAL HIGH (ref 65–99)
POTASSIUM: 3.7 mmol/L (ref 3.5–5.1)
SODIUM: 138 mmol/L (ref 135–145)
TOTAL PROTEIN: 7 g/dL (ref 6.5–8.1)

## 2015-07-06 LAB — CBC WITH DIFFERENTIAL/PLATELET
BASOS ABS: 0.1 10*3/uL (ref 0–0.1)
BASOS PCT: 1 %
EOS ABS: 0.1 10*3/uL (ref 0–0.7)
EOS PCT: 1 %
HEMATOCRIT: 36.9 % (ref 35.0–47.0)
Hemoglobin: 12.2 g/dL (ref 12.0–16.0)
LYMPHS ABS: 3.2 10*3/uL (ref 1.0–3.6)
Lymphocytes Relative: 24 %
MCH: 31.7 pg (ref 26.0–34.0)
MCHC: 32.9 g/dL (ref 32.0–36.0)
MCV: 96.2 fL (ref 80.0–100.0)
Monocytes Absolute: 0.8 10*3/uL (ref 0.2–0.9)
Monocytes Relative: 6 %
NEUTROS ABS: 9.5 10*3/uL — AB (ref 1.4–6.5)
NEUTROS PCT: 68 %
Platelets: 261 10*3/uL (ref 150–440)
RBC: 3.84 MIL/uL (ref 3.80–5.20)
RDW: 13.3 % (ref 11.5–14.5)
WBC: 13.7 10*3/uL — AB (ref 3.6–11.0)

## 2015-07-06 LAB — URINALYSIS COMPLETE WITH MICROSCOPIC (ARMC ONLY)
Bilirubin Urine: NEGATIVE
Glucose, UA: NEGATIVE mg/dL
Hgb urine dipstick: NEGATIVE
Ketones, ur: NEGATIVE mg/dL
Nitrite: NEGATIVE
Protein, ur: 30 mg/dL — AB
SPECIFIC GRAVITY, URINE: 1.024 (ref 1.005–1.030)
pH: 7 (ref 5.0–8.0)

## 2015-07-06 LAB — ACETAMINOPHEN LEVEL

## 2015-07-06 LAB — URINE DRUG SCREEN, QUALITATIVE (ARMC ONLY)
Amphetamines, Ur Screen: POSITIVE — AB
BENZODIAZEPINE, UR SCRN: NOT DETECTED
Barbiturates, Ur Screen: NOT DETECTED
Cannabinoid 50 Ng, Ur ~~LOC~~: NOT DETECTED
Cocaine Metabolite,Ur ~~LOC~~: NOT DETECTED
MDMA (ECSTASY) UR SCREEN: NOT DETECTED
METHADONE SCREEN, URINE: NOT DETECTED
OPIATE, UR SCREEN: NOT DETECTED
Phencyclidine (PCP) Ur S: NOT DETECTED
Tricyclic, Ur Screen: NOT DETECTED

## 2015-07-06 LAB — SALICYLATE LEVEL: Salicylate Lvl: <4 mg/dL (ref 2.8–30.0)

## 2015-07-06 LAB — ETHANOL

## 2015-07-06 LAB — PREGNANCY, URINE: Preg Test, Ur: NEGATIVE — AB

## 2015-07-06 MED ORDER — CIPROFLOXACIN HCL 500 MG PO TABS
500.0000 mg | ORAL_TABLET | Freq: Once | ORAL | Status: AC
  Administered 2015-07-07: 500 mg via ORAL
  Filled 2015-07-06: qty 1

## 2015-07-06 MED ORDER — ACETAMINOPHEN 325 MG PO TABS
650.0000 mg | ORAL_TABLET | Freq: Once | ORAL | Status: AC
  Administered 2015-07-07: 650 mg via ORAL
  Filled 2015-07-06: qty 2

## 2015-07-06 NOTE — ED Notes (Signed)
BEHAVIORAL HEALTH ROUNDING Patient sleeping: No. Patient alert and oriented: yes Behavior appropriate: Yes.  ;  Nutrition and fluids offered: Yes  Toileting and hygiene offered: Yes  Sitter present: yes Law enforcement present: Yes  
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
## 2015-07-06 NOTE — ED Notes (Signed)

## 2015-07-06 NOTE — ED Notes (Signed)
Pt requesting baby doll from home.  Spoke with mother on phone that it is okay to bring.
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
## 2015-07-07 DIAGNOSIS — F329 Major depressive disorder, single episode, unspecified: Secondary | ICD-10-CM | POA: Diagnosis not present

## 2015-07-07 MED ORDER — RANITIDINE HCL 150 MG/10ML PO SYRP
150.0000 mg | ORAL_SOLUTION | Freq: Two times a day (BID) | ORAL | Status: DC
  Administered 2015-07-07 – 2015-07-09 (×5): 150 mg via ORAL
  Filled 2015-07-07 (×7): qty 10

## 2015-07-07 MED ORDER — CLONAZEPAM 0.5 MG PO TABS
0.5000 mg | ORAL_TABLET | Freq: Two times a day (BID) | ORAL | Status: DC
  Administered 2015-07-07 – 2015-07-09 (×5): 0.5 mg via ORAL
  Filled 2015-07-07 (×5): qty 1

## 2015-07-07 MED ORDER — PANTOPRAZOLE SODIUM 40 MG PO TBEC
40.0000 mg | DELAYED_RELEASE_TABLET | Freq: Every day | ORAL | Status: DC
  Administered 2015-07-07 – 2015-07-09 (×3): 40 mg via ORAL
  Filled 2015-07-07 (×4): qty 1

## 2015-07-07 MED ORDER — ESCITALOPRAM OXALATE 10 MG PO TABS
20.0000 mg | ORAL_TABLET | Freq: Every day | ORAL | Status: DC
  Administered 2015-07-07 – 2015-07-09 (×3): 20 mg via ORAL
  Filled 2015-07-07 (×3): qty 2
  Filled 2015-07-07: qty 1

## 2015-07-07 MED ORDER — GABAPENTIN 300 MG PO CAPS
400.0000 mg | ORAL_CAPSULE | Freq: Three times a day (TID) | ORAL | Status: DC
  Administered 2015-07-07 – 2015-07-09 (×7): 400 mg via ORAL
  Filled 2015-07-07 (×11): qty 1

## 2015-07-07 MED ORDER — IPRATROPIUM BROMIDE 0.02 % IN SOLN
0.5000 mg | Freq: Four times a day (QID) | RESPIRATORY_TRACT | Status: DC | PRN
  Filled 2015-07-07: qty 2.5

## 2015-07-07 MED ORDER — ALBUTEROL SULFATE (2.5 MG/3ML) 0.083% IN NEBU
2.5000 mg | INHALATION_SOLUTION | Freq: Four times a day (QID) | RESPIRATORY_TRACT | Status: DC | PRN
  Filled 2015-07-07: qty 3

## 2015-07-07 MED ORDER — BUDESONIDE-FORMOTEROL FUMARATE 160-4.5 MCG/ACT IN AERO
2.0000 | INHALATION_SPRAY | Freq: Two times a day (BID) | RESPIRATORY_TRACT | Status: DC
  Administered 2015-07-07 – 2015-07-09 (×5): 2 via RESPIRATORY_TRACT
  Filled 2015-07-07: qty 6

## 2015-07-07 MED ORDER — MONTELUKAST SODIUM 10 MG PO TABS
10.0000 mg | ORAL_TABLET | Freq: Every day | ORAL | Status: DC
  Administered 2015-07-07 – 2015-07-08 (×2): 10 mg via ORAL
  Filled 2015-07-07 (×3): qty 1

## 2015-07-07 MED ORDER — TOPIRAMATE 25 MG PO TABS
50.0000 mg | ORAL_TABLET | Freq: Two times a day (BID) | ORAL | Status: DC
  Administered 2015-07-07 – 2015-07-09 (×5): 50 mg via ORAL
  Filled 2015-07-07 (×7): qty 2

## 2015-07-07 MED ORDER — ARIPIPRAZOLE 10 MG PO TABS
10.0000 mg | ORAL_TABLET | Freq: Every day | ORAL | Status: DC
  Administered 2015-07-07 – 2015-07-09 (×3): 10 mg via ORAL
  Filled 2015-07-07 (×4): qty 1

## 2015-07-07 MED ORDER — IPRATROPIUM BROMIDE HFA 17 MCG/ACT IN AERS: 2.0000 | INHALATION_SPRAY | Freq: Four times a day (QID) | RESPIRATORY_TRACT | Status: DC | PRN

## 2015-07-07 MED ORDER — TRAZODONE HCL 100 MG PO TABS
150.0000 mg | ORAL_TABLET | Freq: Every day | ORAL | Status: DC
Start: 2015-07-07 — End: 2015-07-09
  Administered 2015-07-07 – 2015-07-08 (×2): 150 mg via ORAL
  Filled 2015-07-07 (×2): qty 1

## 2015-07-07 NOTE — ED Notes (Signed)
Pt standing in her doorway and looking lonesome towards the group of staff talking at the end of the hallway.  RN asked pt if she would like to join in and sit on the hallway bed.  Pt said she would, so pt is now sitting on the hallway bed and smiling, while she listens to the conversation.

## 2015-07-07 NOTE — ED Notes (Signed)
Pt's speaking - sometimes in a normal speaking voice and sometimes pt speaks in a "pitiful", whiny voice.  A couple of times pt has made crying noises from her room.  When confronted by staff "Are you crying?  I don't see any tears.", pt returns to her normal voice.

## 2015-07-07 NOTE — ED Notes (Signed)
Pt has asked several times if she is going home.  Pt has been told that she must see the doctor before decisions will be made.   Pt is watching television.  She will often call out something she is seeing.

## 2015-07-07 NOTE — ED Notes (Signed)
BEHAVIORAL HEALTH ROUNDING Patient sleeping: Yes.   Patient alert and oriented: not applicable Behavior appropriate: Yes.   Nutrition and fluids offered: Yes  Toileting and hygiene offered: Yes  Sitter present: yes Law enforcement present: Yes  

## 2015-07-07 NOTE — ED Notes (Signed)
BEHAVIORAL HEALTH ROUNDING Patient sleeping: No. Patient alert and oriented: yes Behavior appropriate: Yes.  ;  Nutrition and fluids offered: Yes  Toileting and hygiene offered: Yes  Sitter present: yes Law enforcement present: Yes  

## 2015-07-07 NOTE — ED Notes (Addendum)
BEHAVIORAL HEALTH ROUNDING Patient sleeping: No. Patient alert and oriented: alert; not oriented Behavior appropriate: Yes.  ; If no, describe:  Nutrition and fluids offered: Yes  Toileting and hygiene offered: Yes  Sitter present: not applicable Law enforcement present: Yes

## 2015-07-07 NOTE — ED Notes (Signed)
Pt is now laying down on hallway bed; covered in a blanket and resting quietly.

## 2015-07-07 NOTE — ED Notes (Signed)
Pt's call to mother completed.

## 2015-07-07 NOTE — ED Notes (Signed)
ED BHU [place] Is the patient under IVC or is there intent for IVC: Yes.   Is the patient medically cleared: No. Is there vacancy in the ED BHU: Yes.   Is the population mix appropriate for patient: Yes.   Is the patient awaiting placement in inpatient or outpatient setting: Yes.   Has the patient had a psychiatric consult: No. Survey of unit performed for contraband, proper placement and condition of furniture, tampering with fixtures in bathroom, shower, and each patient room: Yes.  ; Findings:  APPEARANCE/BEHAVIOR calm, cooperative and adequate rapport can be established NEURO ASSESSMENT Orientation: place and person Hallucinations: No.None noted (Hallucinations) Speech: Normal Gait: normal RESPIRATORY ASSESSMENT Normal expansion.  Clear to auscultation.  No rales, rhonchi, or wheezing. CARDIOVASCULAR ASSESSMENT regular rate and rhythm, S1, S2 normal, no murmur, click, rub or gallop GASTROINTESTINAL ASSESSMENT soft, nontender, BS WNL, no r/g EXTREMITIES normal strength, tone, and muscle mass PLAN OF CARE Provide calm/safe environment. Vital signs assessed twice daily. ED BHU Assessment once each 12-hour shift. Collaborate with intake RN daily or as condition indicates. Assure the ED provider has rounded once each shift. Provide and encourage hygiene. Provide redirection as needed. Assess for escalating behavior; address immediately and inform ED provider.  Assess family dynamic and appropriateness for visitation as needed: Yes.  ; If necessary, describe findings:  Educate the patient/family about BHU procedures/visitation: Yes.  ; If necessary, describe findings:

## 2015-07-07 NOTE — ED Notes (Addendum)
Pt back from shower.  SO reported that pt continued to cry in the [place].  RN presented baby doll to pt and pt grinned and held out her arms for the doll.  Pt wanted everyone to hear the baby doll say "I love you."  She showed it to SO's (3), the HT and the RN.  HT remained with pt while she had the doll.  After a few minutes with the doll, pt has returned doll  to the RN desk. [name] is sitting in a chair so that the pt can see it.  Pt is standing in the hallway laughing.

## 2015-07-07 NOTE — ED Notes (Signed)
BEHAVIORAL HEALTH ROUNDING Patient sleeping: No. Patient alert and oriented: yes Behavior appropriate: Yes.  ; If no, describe:  Nutrition and fluids offered: Yes  Toileting and hygiene offered: Yes  Sitter present: not applicable Law enforcement present: Yes  

## 2015-07-07 NOTE — ED Notes (Signed)
Pt's father brings pt's baby doll and asks to visit with pt.  Father is told that visiting hours are from 68 - 3.  RN reported to pt's RN that father appeared to "tear up" and asked if we would make an exception since this might be the last time he would ever see the pt again.  Father was told to come back at noon and he could see the pt.  Two RNs checked baby doll for contraband and found none.

## 2015-07-07 NOTE — ED Notes (Signed)
Pt standing on the floor and bending over with her head on the bed.  Pt is making crying noises, with no tears.  RN asked pt what she was doing and pt replied that she wanted to go to a new home.  RN reminded pt again that no decisions could be made until she saw the MD.
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
## 2015-07-07 NOTE — ED Notes (Signed)
Pt watching TV and laughing out loud in her room.
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
## 2015-07-07 NOTE — ED Notes (Signed)

## 2015-07-07 NOTE — ED Notes (Signed)
Pt to BHU for shower.

## 2015-07-07 NOTE — ED Notes (Signed)
Pt has her baby doll again.  HT, SO, and RN are interacting with the pt about the baby.  Pt remains on the hallway stretcher.  She is laughing.

## 2015-07-07 NOTE — ED Notes (Signed)
BEHAVIORAL HEALTH ROUNDING Patient sleeping: No. Patient alert and oriented: not applicable Behavior appropriate: Yes.  ; If no, describe:  Nutrition and fluids offered: No Toileting and hygiene offered: No Sitter present: not applicable Law enforcement present: Yes
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
## 2015-07-07 NOTE — ED Notes (Signed)
Pt appears more content being in the hallway than in her room.  She continues to observe and listen.  She responds appropriately to staff.

## 2015-07-07 NOTE — ED Notes (Signed)
Pt still has baby doll on her lap; sitting on hallway bed and interacting with HT and SO.  She called the RN over to say that the HT and SO were not behaving, so RN told pt that pt was in charge in the area and she would have to keep tabs on the HT and the SO.  Everybody laughed. Pt seems to be enjoying the attention and the interaction and the doll.

## 2015-07-07 NOTE — ED Notes (Signed)
Pt is standing in her doorway; talking with HT and SO.

## 2015-07-07 NOTE — ED Notes (Signed)
Behavior is appropriate for this particular pt.

## 2015-07-07 NOTE — ED Provider Notes (Signed)
-----------------------------------------   6:20 PM on 07/07/2015 -----------------------------------------  Algis Greenhouse has been seen by Dr. Franchot Mimes, psychiatry. She advises the patient needs additional support from social work and case management for placement. She will remain in the emergency department as we find a suitable care location for her.    Ahmed Prima, MD 07/07/15 [phone]

## 2015-07-08 DIAGNOSIS — F329 Major depressive disorder, single episode, unspecified: Secondary | ICD-10-CM | POA: Diagnosis not present

## 2015-07-08 LAB — URINE CULTURE

## 2015-07-08 MED ORDER — ALUM & MAG HYDROXIDE-SIMETH 200-200-20 MG/5ML PO SUSP
ORAL | Status: AC
  Administered 2015-07-08: 30 mL via ORAL
  Filled 2015-07-08: qty 30

## 2015-07-08 MED ORDER — ALUM & MAG HYDROXIDE-SIMETH 200-200-20 MG/5ML PO SUSP
30.0000 mL | Freq: Once | ORAL | Status: AC
  Administered 2015-07-08: 30 mL via ORAL

## 2015-07-08 MED ORDER — ALUM & MAG HYDROXIDE-SIMETH 200-200-20 MG/5ML PO SUSP
15.0000 mL | Freq: Four times a day (QID) | ORAL | Status: DC | PRN
  Administered 2015-07-08: 15 mL via ORAL

## 2015-07-08 MED ORDER — IPRATROPIUM-ALBUTEROL 0.5-2.5 (3) MG/3ML IN SOLN
RESPIRATORY_TRACT | Status: AC
  Administered 2015-07-08: 3 mL
  Filled 2015-07-08: qty 3

## 2015-07-08 MED ORDER — ALBUTEROL SULFATE (2.5 MG/3ML) 0.083% IN NEBU: 2.5000 mg | INHALATION_SOLUTION | Freq: Once | RESPIRATORY_TRACT | Status: AC

## 2015-07-08 MED ORDER — ALUM & MAG HYDROXIDE-SIMETH 200-200-20 MG/5ML PO SUSP
ORAL | Status: AC
  Administered 2015-07-08: 15 mL via ORAL
  Filled 2015-07-08: qty 30

## 2015-07-08 NOTE — ED Notes (Signed)
Pt c/o breathing difficulty.  MD assessed.  Pt displayed throat wheezing when breathing from her mouth and no wheezing when mouth was closed. Pt moved from hallway bed to Rm 20 for nebulizer treatment. There was no wheezing when pt was moved into Rn 20 for treatment.  Treatment was administered.  Pt remains in Rm 20 at this time.
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
## 2015-07-08 NOTE — ED Notes (Signed)
BEHAVIORAL HEALTH ROUNDING Patient sleeping: Yes.   Patient alert and oriented: no Behavior appropriate: Yes.  ; If no, describe:   Nutrition and fluids offered: No Toileting and hygiene offered: No Sitter present: no Law enforcement present: Yes  and ODS 

## 2015-07-08 NOTE — ED Notes (Signed)
BEHAVIORAL HEALTH ROUNDING Patient sleeping: Yes.   Patient alert and oriented: yes Behavior appropriate: Yes.  ; If no, describe:  Nutrition and fluids offered: Yes  Toileting and hygiene offered: Yes  Sitter present: yes Law enforcement present: Yes  

## 2015-07-08 NOTE — ED Notes (Signed)
Pt is eating lunch in her room.  She asked RN if she could go back to the hall bed.  Conversation among SOs and HT taking place and the pt wants to be included.  RN told pt that we were concerned since she had c/o breathing difficulties, sore throat, and abdominal pain throughout the morning and had made several requests to see the MD.  If she is sick, then she needs to be in a separate room so we can care for her properly.  Pt assured RN that she could breathe without any difficulty, her throat didn't hurt anymore, and her stomach didn't hurt anymore.  RN told pt that, if pt remained in such good condition, then pt could certainly return to the hall bed after lunch.

## 2015-07-08 NOTE — ED Notes (Signed)
Pt is sleeping on her hallway bed.

## 2015-07-08 NOTE — ED Notes (Addendum)
BEHAVIORAL HEALTH ROUNDING Patient sleeping: No. Patient alert and oriented: alert; not oriented Behavior appropriate: Yes.  ; If no, describe:  Nutrition and fluids offered: Yes  Toileting and hygiene offered: Yes  Sitter present: not applicable Law enforcement present: Yes

## 2015-07-08 NOTE — ED Notes (Signed)
BEHAVIORAL HEALTH ROUNDING Patient sleeping: No. Patient alert and oriented: alert; not oriented Behavior appropriate: Yes.  ; If no, describe:  Nutrition and fluids offered: Yes  Toileting and hygiene offered: Yes  Sitter present: not applicable Law enforcement present: Yes
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
## 2015-07-08 NOTE — ED Notes (Signed)
BEHAVIORAL HEALTH ROUNDING Patient sleeping: Yes.   Patient alert and oriented: yes Behavior appropriate: Yes.  ; If no, describe: sleeping Nutrition and fluids offered: Yes  Toileting and hygiene offered: Yes  Sitter present: yes Law enforcement present: Yes

## 2015-07-08 NOTE — ED Notes (Signed)
Pt resting in her room.

## 2015-07-08 NOTE — ED Notes (Signed)

## 2015-07-08 NOTE — ED Notes (Signed)
BEHAVIORAL HEALTH ROUNDING Patient sleeping: No. Patient alert and oriented: alert, not oriented Behavior appropriate: Yes.  ; If no, describe:  Nutrition and fluids offered: Yes  Toileting and hygiene offered: Yes  Sitter present: not applicable Law enforcement present: Yes

## 2015-07-08 NOTE — ED Notes (Signed)
Pt sitting on hallway bed.  Pt is laughing and talking with staff.

## 2015-07-08 NOTE — ED Notes (Signed)
Pt continuing to lay in hallway bed and interacting with staff. SW visited with pt.

## 2015-07-08 NOTE — ED Notes (Signed)
BEHAVIORAL HEALTH ROUNDING Patient sleeping: No. Patient alert and oriented: alert, but not oriented. Behavior appropriate: Yes.  ; If no, describe:  Nutrition and fluids offered: Yes  Toileting and hygiene offered: Yes  Sitter present: not applicable Law enforcement present: Yes

## 2015-07-08 NOTE — ED Notes (Signed)
BEHAVIORAL HEALTH ROUNDING Patient sleeping: No. Patient alert and oriented: yes Behavior appropriate: Yes.  ; If no, describe:  IDD affect Nutrition and fluids offered: Yes  Toileting and hygiene offered: Yes  Sitter present: no Law enforcement present: Yes  and ODS  ENVIRONMENTAL ASSESSMENT Potentially harmful objects out of patient reach: Yes.   Personal belongings secured: Yes.   Patient dressed in hospital provided attire only: Yes.   Plastic bags out of patient reach: Yes.   Patient care equipment (cords, cables, call bells, lines, and drains) shortened, removed, or accounted for: Yes.   Equipment and supplies removed from bottom of stretcher: Yes.   Potentially toxic materials out of patient reach: Yes.   Sharps container removed or out of patient reach: Yes.

## 2015-07-08 NOTE — ED Notes (Signed)
Pt laying on hallway bed; interacting with staff.  Pt stated "They gave me something that made my stomach hurt last night."  RN asked what that had been and pt said it was a ham sandwich.  RN suggested that today we would avoid ham sandwiches and pt agreed.

## 2015-07-08 NOTE — ED Notes (Signed)
Pt sleeping after walking to BR, states "I want to live by myself"

## 2015-07-08 NOTE — ED Notes (Signed)
Father visiting.  Father said that they (parents) had brought the chicken nuggets as well as the bag pt had requested from them.  Father told that pt could have neither.  Policies were explained to him.  Mother is waiting for second visit.

## 2015-07-08 NOTE — ED Provider Notes (Signed)
Notified by father and they have changed their mind and want to take patient home  Jamie Drafts, MD 07/08/15 [phone]

## 2015-07-08 NOTE — ED Notes (Signed)
RN was handed phone and told that someone wanted to talk with RN about pt.  RN answered the phone and asked, for the record, who was calling. The background was very scratchy and difficult to hear, but it seems there were two men handing the phone back and forth.  It appeared that one man owned the phone and the other man was borrowing the phone.  RN cannot attest to which man said what because of the background noise, but RN asked who was calling and a man said "I'm the one she's having trouble with."  RN asked for a name and the phone may have been passed, but the next thing said to RN that was understandable was "I hope you know that she has multiple personalities.  All you have to do is talk to her and you can tell that."  RN asked for the name of the caller for the third time and the voice said "I'm nobody."  RN said "Unfortunately, I do not speak about pts or anything else to nobody."  RN disconnected the call.  No call backs thus far.

## 2015-07-08 NOTE — ED Notes (Signed)
BEHAVIORAL HEALTH ROUNDING Patient sleeping: No. Patient alert and oriented: yes Behavior appropriate: Yes.  ; If no, describe:  IDD affect Nutrition and fluids offered: Yes  Toileting and hygiene offered: Yes  Sitter present: no Law enforcement present: Yes  and ODS

## 2015-07-08 NOTE — ED Provider Notes (Signed)
-----------------------------------------   7:39 AM on 07/08/2015 -----------------------------------------   BP 104/72 mmHg  Pulse 80  Temp(Src) 98.2 F (36.8 C) (Oral)  Resp 18  Ht 5' (1.524 m)  Wt   SpO2 97%  No acute events overnight. Vitals reviewed. Patient remains medically cleared. Seen by psychiatry and pending social work assistance with disposition   Lavonia Drafts, MD 07/08/15 [phone]

## 2015-07-08 NOTE — ED Notes (Signed)
Pt awaken and sitting up, states she can't sleep

## 2015-07-08 NOTE — ED Notes (Signed)
Pt holding her breath.  HT and RN have told pt that she needs to breathe.  Told pt that we would like to bring her back into the hallway, but, as long as she was "sick", she would have to stay in her room.  Found pt's soda all over the floor.  Pt said it "slipped."  Pt had told SO that we needed to call her parents and tell them that she couldn't breathe.

## 2015-07-08 NOTE — ED Notes (Signed)
ED BHU [place] Is the patient under IVC or is there intent for IVC: Yes.   Is the patient medically cleared: Yes.   Is there vacancy in the ED BHU: Yes.   Is the population mix appropriate for patient: Yes.   Is the patient awaiting placement in inpatient or outpatient setting: Yes.   Has the patient had a psychiatric consult: Yes.   Survey of unit performed for contraband, proper placement and condition of furniture, tampering with fixtures in bathroom, shower, and each patient room: Yes.  ; Findings:  APPEARANCE/BEHAVIOR calm, cooperative and adequate rapport can be established NEURO ASSESSMENT Orientation: place and person Hallucinations: No.None noted (Hallucinations) Speech: Normal Gait: normal RESPIRATORY ASSESSMENT Normal expansion.  Clear to auscultation.  No rales, rhonchi, or wheezing. CARDIOVASCULAR ASSESSMENT regular rate and rhythm, S1, S2 normal, no murmur, click, rub or gallop GASTROINTESTINAL ASSESSMENT soft, nontender, BS WNL, no r/g EXTREMITIES normal strength, tone, and muscle mass PLAN OF CARE Provide calm/safe environment. Vital signs assessed twice daily. ED BHU Assessment once each 12-hour shift. Collaborate with intake RN daily or as condition indicates. Assure the ED provider has rounded once each shift. Provide and encourage hygiene. Provide redirection as needed. Assess for escalating behavior; address immediately and inform ED provider.  Assess family dynamic and appropriateness for visitation as needed: Yes.  ; If necessary, describe findings:  Educate the patient/family about BHU procedures/visitation: Yes.  ; If necessary, describe findings:

## 2015-07-08 NOTE — ED Notes (Signed)
Pt c/o "needing oxygen."  HT checked SpO2.  96 %.  Pt was reminded to breathe as had been previously discussed with her.

## 2015-07-08 NOTE — ED Notes (Signed)
Patient resting quietly at this time.  No acute distress noted. 

## 2015-07-08 NOTE — ED Notes (Signed)
RN asked pt how her visits had gone with her parents.  Pt said the visit with her mother was "alright" but the visit with her father was "not good." Then, pt asked RN if she was going to have to go home.  RN told pt that, according to current plans, she probably would not have to go back home, but nothing was completely definite.  Pt said "Thank you."

## 2015-07-08 NOTE — ED Notes (Signed)
Pt sitting in hallway bed again.  RN asked if she liked it better than being in a room and she said she did.

## 2015-07-08 NOTE — ED Notes (Addendum)
Pt's mother called and pt spoke with her.  Pt told mother that pt wasn't able to talk because she was sick, said she had been vomiting (which she hasn't), asked that parents bring her chicken nuggets.  Pt had been told food could not be brought to the hospital and, while she was on the phone with her mother and asking for chicken nuggets, HT reminded pt to tell parents that she could not receive food.  Pt refused and after she had finished the call, she told the HT that we would have to call her parents and tell them that she couldn't accept the food.

## 2015-07-08 NOTE — ED Notes (Signed)
Gait and speech normal for pt.

## 2015-07-09 ENCOUNTER — Emergency Department: Payer: Medicare Other

## 2015-07-09 DIAGNOSIS — F329 Major depressive disorder, single episode, unspecified: Secondary | ICD-10-CM | POA: Diagnosis not present

## 2015-07-09 MED ORDER — ALBUTEROL SULFATE (2.5 MG/3ML) 0.083% IN NEBU: 2.5000 mg | INHALATION_SOLUTION | Freq: Four times a day (QID) | RESPIRATORY_TRACT | Status: DC | PRN

## 2015-07-09 MED ORDER — ESCITALOPRAM OXALATE 20 MG PO TABS
20.0000 mg | ORAL_TABLET | Freq: Every day | ORAL | Status: DC
Start: 2015-07-09 — End: 2016-03-24

## 2015-07-09 MED ORDER — TOPIRAMATE 50 MG PO TABS: 50.0000 mg | ORAL_TABLET | Freq: Two times a day (BID) | ORAL | Status: DC

## 2015-07-09 MED ORDER — CIPROFLOXACIN HCL 500 MG PO TABS: 500.0000 mg | ORAL_TABLET | Freq: Two times a day (BID) | ORAL | Status: DC

## 2015-07-09 MED ORDER — CLONAZEPAM 0.5 MG PO TABS: 0.5000 mg | ORAL_TABLET | Freq: Two times a day (BID) | ORAL | Status: DC

## 2015-07-09 MED ORDER — TUBERCULIN PPD 5 UNIT/0.1ML ID SOLN
5.0000 [IU] | Freq: Once | INTRADERMAL | Status: DC
  Administered 2015-07-09: 5 [IU] via INTRADERMAL
  Filled 2015-07-09: qty 0.1

## 2015-07-09 MED ORDER — IPRATROPIUM BROMIDE 0.02 % IN SOLN: 0.5000 mg | Freq: Four times a day (QID) | RESPIRATORY_TRACT | Status: DC | PRN

## 2015-07-09 MED ORDER — PANTOPRAZOLE SODIUM 40 MG PO TBEC: 40.0000 mg | DELAYED_RELEASE_TABLET | Freq: Every day | ORAL | Status: DC

## 2015-07-09 MED ORDER — MONTELUKAST SODIUM 10 MG PO TABS: 10.0000 mg | ORAL_TABLET | Freq: Every day | ORAL | Status: DC

## 2015-07-09 MED ORDER — GABAPENTIN 400 MG PO CAPS: 400.0000 mg | ORAL_CAPSULE | Freq: Three times a day (TID) | ORAL | Status: DC

## 2015-07-09 MED ORDER — TRAZODONE HCL 150 MG PO TABS: 150.0000 mg | ORAL_TABLET | Freq: Every day | ORAL | Status: DC

## 2015-07-09 MED ORDER — BUDESONIDE-FORMOTEROL FUMARATE 160-4.5 MCG/ACT IN AERO: 2.0000 | INHALATION_SPRAY | Freq: Two times a day (BID) | RESPIRATORY_TRACT | Status: DC

## 2015-07-09 MED ORDER — ARIPIPRAZOLE 10 MG PO TABS: 10.0000 mg | ORAL_TABLET | Freq: Every day | ORAL | Status: DC

## 2015-07-09 NOTE — ED Notes (Signed)
Pt very frustrated that she is unable to be discharged today. Explained to pt that she is here for her safety and that we are trying to find a safe place for her to go. Pt angry and threw her food in the floor. Pt is banging on the door trying to get out and yelling. Giving the patient some time to calm herself and will continue to monitor for harmful activity.

## 2015-07-09 NOTE — ED Notes (Signed)
Patient assigned to appropriate care area. Patient oriented to unit/care area: Informed that, for their safety, care areas are designed for safety and monitored by security cameras at all times; and visiting hours explained to patient. Patient verbalizes understanding, and verbal contract for safety obtained. 

## 2015-07-09 NOTE — ED Notes (Signed)
Intake with pt 

## 2015-07-09 NOTE — ED Notes (Signed)
BEHAVIORAL HEALTH ROUNDING Patient sleeping: No. Patient alert and oriented: yes Behavior appropriate: Yes.  ; If no, describe: knocking on window and door trying to get out Nutrition and fluids offered: Yes  Toileting and hygiene offered: No Sitter present: no Law enforcement present: Yes

## 2015-07-09 NOTE — ED Notes (Signed)
BEHAVIORAL HEALTH ROUNDING Patient sleeping: Yes.   Patient alert and oriented: yes Behavior appropriate: Yes.  ; If no, describe:  Nutrition and fluids offered: Yes  Toileting and hygiene offered: Yes  Sitter present: no Law enforcement present: Yes  

## 2015-07-09 NOTE — ED Notes (Signed)
BEHAVIORAL HEALTH ROUNDING Patient sleeping: Yes.   Patient alert and oriented: yes Behavior appropriate: Yes.  ; If no, describe:  Nutrition and fluids offered: Yes  Toileting and hygiene offered: Yes  Sitter present: No Law enforcement present: Yes

## 2015-07-09 NOTE — Progress Notes (Signed)
LCSW consulted with Dr Weber Cooks and discussed potential for discharge and follow up provisions for the patient. He signed FL2 and wrote out scripts. Patient will follow up with [facility] Dr Verl Blalock. LCSW did fax them to  [place] at 2:55pm. LCSW called and spoke to Jay and she will pick pt up at 4pm.

## 2015-07-09 NOTE — ED Notes (Signed)
BEHAVIORAL HEALTH ROUNDING Patient sleeping: No. Patient alert and oriented: yes Behavior appropriate: Yes.  ; If no, describe:  Nutrition and fluids offered: Yes  Toileting and hygiene offered: Yes  Sitter present: no Law enforcement present: Yes  

## 2015-07-09 NOTE — Progress Notes (Signed)
LCSW received a call from  [facility] and stated patient has been found a suitable placement. She will be going to the [facility] home of [place]. Danae Chen will pick up patient this afternoon. LCSW will seek out a signature from psychiatrist on FL2. Patient continue to follow CBC Dr Verl Blalock. LCSW to inquire if patient has had TB test or x rays.

## 2015-07-09 NOTE — ED Notes (Signed)
ED BHU [place] Is the patient under IVC or is there intent for IVC: Yes.   Is the patient medically cleared: Yes.   Is there vacancy in the ED BHU: Yes.   Is the population mix appropriate for patient: Yes.   Is the patient awaiting placement in inpatient or outpatient setting: Yes.   Has the patient had a psychiatric consult: No. Survey of unit performed for contraband, proper placement and condition of furniture, tampering with fixtures in bathroom, shower, and each patient room: Yes.  ; Findings:  APPEARANCE/BEHAVIOR calm and cooperative NEURO ASSESSMENT Orientation: time, place and person Hallucinations: No.None noted (Hallucinations) Speech: Normal Gait: normal RESPIRATORY ASSESSMENT Normal expansion.  Clear to auscultation.  No rales, rhonchi, or wheezing. CARDIOVASCULAR ASSESSMENT regular rate and rhythm, S1, S2 normal, no murmur, click, rub or gallop GASTROINTESTINAL ASSESSMENT soft, nontender, BS WNL, no r/g EXTREMITIES normal strength, tone, and muscle mass PLAN OF CARE Provide calm/safe environment. Vital signs assessed twice daily. ED BHU Assessment once each 12-hour shift. Collaborate with intake RN daily or as condition indicates. Assure the ED provider has rounded once each shift. Provide and encourage hygiene. Provide redirection as needed. Assess for escalating behavior; address immediately and inform ED provider.  Assess family dynamic and appropriateness for visitation as needed: Yes.  ; If necessary, describe findings:  Educate the patient/family about BHU procedures/visitation: Yes.  ; If necessary, describe findings Is the patient under IVC or is there intent for IVC: Yes.   Is the patient medically cleared: Yes.   Is there vacancy in the ED BHU: Yes.   Is the population mix appropriate for patient: Yes.   Is the patient awaiting placement in inpatient or outpatient setting: Yes.   Has the patient had a psychiatric consult: Yes.   Survey of unit  performed for contraband, proper placement and condition of furniture, tampering with fixtures in bathroom, shower, and each patient room: Yes.  ; Findings:  APPEARANCE/BEHAVIOR calm NEURO ASSESSMENT Orientation: time, place and person Hallucinations: No.None noted (Hallucinations) Speech: Normal Gait: normal RESPIRATORY ASSESSMENT Normal expansion.  Clear to auscultation.  No rales, rhonchi, or wheezing. CARDIOVASCULAR ASSESSMENT regular rate and rhythm, S1, S2 normal, no murmur, click, rub or gallop GASTROINTESTINAL ASSESSMENT soft, nontender, BS WNL, no r/g EXTREMITIES normal strength, tone, and muscle mass PLAN OF CARE Provide calm/safe environment. Vital signs assessed twice daily. ED BHU Assessment once each 12-hour shift. Collaborate with intake RN daily or as condition indicates. Assure the ED provider has rounded once each shift. Provide and encourage hygiene. Provide redirection as needed. Assess for escalating behavior; address immediately and inform ED provider.  Assess family dynamic and appropriateness for visitation as needed: Yes.  ; If necessary, describe findings:  Educate the patient/family about BHU procedures/visitation: Yes.  ; If necessary, describe findings:

## 2015-07-09 NOTE — Progress Notes (Signed)
LCSW called [place] [facility], [place] home manager Danae Chen will pick up patient when pt is ready for discharge [phone]

## 2015-07-09 NOTE — ED Notes (Signed)
Patient transported to X-ray 

## 2015-07-09 NOTE — ED Notes (Signed)

## 2015-07-09 NOTE — ED Notes (Signed)
Pt has calmed somewhat but is still knocking on the window and standing at the door, apparently waiting to get out.
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
## 2015-07-09 NOTE — ED Notes (Signed)
ED BHU [place] Is the patient under IVC or is there intent for IVC: Yes.   Is the patient medically cleared: Yes.   Is there vacancy in the ED BHU: Yes.   Is the population mix appropriate for patient: Yes.   Is the patient awaiting placement in inpatient or outpatient setting: Yes.   Has the patient had a psychiatric consult: Yes.   Survey of unit performed for contraband, proper placement and condition of furniture, tampering with fixtures in bathroom, shower, and each patient room: Yes.  ; Findings:  APPEARANCE/BEHAVIOR calm, cooperative and adequate rapport can be established NEURO ASSESSMENT Orientation: time, place and person Hallucinations: No.None noted (Hallucinations) Speech: Normal Gait: normal RESPIRATORY ASSESSMENT Normal expansion.  Clear to auscultation.  No rales, rhonchi, or wheezing. CARDIOVASCULAR ASSESSMENT regular rate and rhythm, S1, S2 normal, no murmur, click, rub or gallop GASTROINTESTINAL ASSESSMENT soft, nontender, BS WNL, no r/g EXTREMITIES normal strength, tone, and muscle mass PLAN OF CARE Provide calm/safe environment. Vital signs assessed twice daily. ED BHU Assessment once each 12-hour shift. Collaborate with intake RN daily or as condition indicates. Assure the ED provider has rounded once each shift. Provide and encourage hygiene. Provide redirection as needed. Assess for escalating behavior; address immediately and inform ED provider.  Assess family dynamic and appropriateness for visitation as needed: Yes.  ; If necessary, describe findings:  Educate the patient/family about BHU procedures/visitation: Yes.  ; If necessary, describe findings:

## 2015-07-09 NOTE — ED Notes (Signed)
Pt watching tv after speaking with Claudine, CSW. She was standing at door crying and moaning. This nurse tried several times to redirect her, but she insisted on standing at door and staring, crying, moaning.

## 2015-07-09 NOTE — ED Provider Notes (Signed)
-----------------------------------------   3:33 PM on 07/09/2015 -----------------------------------------  Case discussed with Dr. Carlena Hurl who reports that the patient will be discharged to [place] with follow-up with Dr. Verl Blalock at [facility]. He has lifted IVC paperwork. We'll discharge with continued Cipro for UTI.  Joanne Gavel, MD 07/09/15 1534

## 2015-07-09 NOTE — ED Provider Notes (Signed)
-----------------------------------------   8:44 AM on 07/09/2015 -----------------------------------------   Blood pressure 134/83, pulse 83, temperature 97.8 F (36.6 C), temperature source Oral, resp. rate 18, height 5' (1.524 m), SpO2 97 %.  The patient had no acute events since last update.  Calm and cooperative at this time.  Disposition is pending per Psychiatry/Behavioral Medicine team recommendations.     Joanne Gavel, MD 07/09/15 [phone]

## 2015-07-09 NOTE — ED Notes (Addendum)
BEHAVIORAL HEALTH ROUNDING Patient sleeping: Yes.   Patient alert and oriented: yes Behavior appropriate: Yes.  ; If no, describe:  Nutrition and fluids offered: Yes  Toileting and hygiene offered: Yes  Sitter present: no Law enforcement present: Yes  

## 2015-07-09 NOTE — ED Notes (Signed)
Pt assisted with toileting 

## 2015-07-09 NOTE — ED Notes (Addendum)
Pt discharged to group home with [facility]. Shirell given discharge instructions as pt lacks ability to understand. NAD noted.

## 2015-07-12 DIAGNOSIS — F79 Unspecified intellectual disabilities: Secondary | ICD-10-CM | POA: Insufficient documentation

## 2015-08-03 ENCOUNTER — Other Ambulatory Visit: Payer: Self-pay | Admitting: Psychiatry

## 2015-10-05 ENCOUNTER — Emergency Department
Admission: EM | Admit: 2015-10-05 | Discharge: 2015-10-07 | Disposition: A | Discharge status: Home / Self Care | Payer: Medicare Other | Attending: Emergency Medicine | Admitting: Emergency Medicine

## 2015-10-05 ENCOUNTER — Encounter: Payer: Self-pay | Admitting: Emergency Medicine

## 2015-10-05 ENCOUNTER — Ambulatory Visit (HOSPITAL_COMMUNITY)
Admission: EM | Admit: 2015-10-05 | Discharge: 2015-10-05 | Disposition: A | Discharge status: Home / Self Care | Payer: No Typology Code available for payment source | Attending: Emergency Medicine | Admitting: Emergency Medicine

## 2015-10-05 DIAGNOSIS — Z79899 Other long term (current) drug therapy: Secondary | ICD-10-CM | POA: Insufficient documentation

## 2015-10-05 DIAGNOSIS — F329 Major depressive disorder, single episode, unspecified: Secondary | ICD-10-CM | POA: Diagnosis not present

## 2015-10-05 DIAGNOSIS — Y998 Other external cause status: Secondary | ICD-10-CM | POA: Diagnosis not present

## 2015-10-05 DIAGNOSIS — Z0441 Encounter for examination and observation following alleged adult rape: Secondary | ICD-10-CM | POA: Insufficient documentation

## 2015-10-05 DIAGNOSIS — Z880 Allergy status to penicillin: Secondary | ICD-10-CM | POA: Diagnosis not present

## 2015-10-05 DIAGNOSIS — Z3202 Encounter for pregnancy test, result negative: Secondary | ICD-10-CM | POA: Insufficient documentation

## 2015-10-05 DIAGNOSIS — Z792 Long term (current) use of antibiotics: Secondary | ICD-10-CM | POA: Diagnosis not present

## 2015-10-05 DIAGNOSIS — F32A Depression, unspecified: Secondary | ICD-10-CM

## 2015-10-05 DIAGNOSIS — Y9289 Other specified places as the place of occurrence of the external cause: Secondary | ICD-10-CM | POA: Insufficient documentation

## 2015-10-05 DIAGNOSIS — T7421XA Adult sexual abuse, confirmed, initial encounter: Secondary | ICD-10-CM | POA: Diagnosis present

## 2015-10-05 LAB — POCT PREGNANCY, URINE: Preg Test, Ur: NEGATIVE

## 2015-10-05 NOTE — ED Notes (Signed)
SANE nurse at bedside with patient

## 2015-10-05 NOTE — ED Notes (Signed)
Spoke with [employer] from [place] (patient's guardian), and received verbal consent to treat patient and discharge back to facility.  Facility owner is Jannette Fogo.  Cell phone:  [phone].  Alt contact number:  [phone].

## 2015-10-05 NOTE — ED Notes (Signed)
Spoke with SANE nurse, she will be en route to [facility] and should arrive approximately 45 minutes from now.
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
## 2015-10-05 NOTE — ED Notes (Signed)
Called and spoke with [facility].  Discussed patient's reluctance to return to facility.  Understanding acknowledged, plan in place for social worker [name] to meet with patient in the morning.
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
## 2015-10-05 NOTE — ED Notes (Signed)
Called  Jamie Ross.  Message left on voice mail.  Called second contact number.  Reached the facility.  Left message to for [name] to return call.  Will continue to call cell phone.

## 2015-10-06 DIAGNOSIS — F329 Major depressive disorder, single episode, unspecified: Secondary | ICD-10-CM | POA: Diagnosis not present

## 2015-10-06 LAB — COMPREHENSIVE METABOLIC PANEL
ALT: 8 U/L — ABNORMAL LOW (ref 14–54)
ANION GAP: 5 (ref 5–15)
AST: 12 U/L — ABNORMAL LOW (ref 15–41)
Albumin: 3.6 g/dL (ref 3.5–5.0)
Alkaline Phosphatase: 84 U/L (ref 38–126)
BUN: 12 mg/dL (ref 6–20)
CO2: 25 mmol/L (ref 22–32)
CREATININE: 0.83 mg/dL (ref 0.44–1.00)
Calcium: 9 mg/dL (ref 8.9–10.3)
Chloride: 110 mmol/L (ref 101–111)
GFR calc non Af Amer: >60 mL/min (ref >60)
Glucose, Bld: 109 mg/dL — ABNORMAL HIGH (ref 65–99)
Potassium: 3.8 mmol/L (ref 3.5–5.1)
SODIUM: 140 mmol/L (ref 135–145)
TOTAL PROTEIN: 6.8 g/dL (ref 6.5–8.1)
Total Bilirubin: 0.3 mg/dL (ref 0.3–1.2)

## 2015-10-06 LAB — URINE DRUG SCREEN, QUALITATIVE (ARMC ONLY)
AMPHETAMINES, UR SCREEN: NOT DETECTED
Barbiturates, Ur Screen: NOT DETECTED
Benzodiazepine, Ur Scrn: NOT DETECTED
CANNABINOID 50 NG, UR ~~LOC~~: NOT DETECTED
Cocaine Metabolite,Ur ~~LOC~~: NOT DETECTED
MDMA (Ecstasy)Ur Screen: NOT DETECTED
Methadone Scn, Ur: NOT DETECTED
Opiate, Ur Screen: NOT DETECTED
Phencyclidine (PCP) Ur S: NOT DETECTED
TRICYCLIC, UR SCREEN: NOT DETECTED

## 2015-10-06 LAB — URINALYSIS COMPLETE WITH MICROSCOPIC (ARMC ONLY)
BILIRUBIN URINE: NEGATIVE
Bacteria, UA: NONE SEEN
Glucose, UA: NEGATIVE mg/dL
Hgb urine dipstick: NEGATIVE
Ketones, ur: NEGATIVE mg/dL
LEUKOCYTES UA: NEGATIVE
Nitrite: NEGATIVE
PH: 7 (ref 5.0–8.0)
Protein, ur: NEGATIVE mg/dL
SPECIFIC GRAVITY, URINE: 1.013 (ref 1.005–1.030)
WBC, UA: NONE SEEN WBC/hpf (ref 0–5)

## 2015-10-06 LAB — CBC WITH DIFFERENTIAL/PLATELET
BASOS ABS: 0.1 10*3/uL (ref 0–0.1)
BASOS PCT: 1 %
EOS ABS: 0.2 10*3/uL (ref 0–0.7)
EOS PCT: 2 %
HCT: 37.9 % (ref 35.0–47.0)
HEMOGLOBIN: 12.3 g/dL (ref 12.0–16.0)
Lymphocytes Relative: 22 %
Lymphs Abs: 2.5 10*3/uL (ref 1.0–3.6)
MCH: 31.2 pg (ref 26.0–34.0)
MCHC: 32.4 g/dL (ref 32.0–36.0)
MCV: 96 fL (ref 80.0–100.0)
Monocytes Absolute: 0.6 10*3/uL (ref 0.2–0.9)
Monocytes Relative: 6 %
NEUTROS PCT: 69 %
Neutro Abs: 7.9 10*3/uL — ABNORMAL HIGH (ref 1.4–6.5)
PLATELETS: 255 10*3/uL (ref 150–440)
RBC: 3.95 MIL/uL (ref 3.80–5.20)
RDW: 13.7 % (ref 11.5–14.5)
WBC: 11.3 10*3/uL — AB (ref 3.6–11.0)

## 2015-10-06 LAB — SALICYLATE LEVEL: Salicylate Lvl: <4 mg/dL (ref 2.8–30.0)

## 2015-10-06 LAB — ETHANOL: Alcohol, Ethyl (B): 6 mg/dL — ABNORMAL HIGH (ref <5)

## 2015-10-06 LAB — ACETAMINOPHEN LEVEL: Acetaminophen (Tylenol), Serum: <10 ug/mL — ABNORMAL LOW (ref 10–30)

## 2015-10-06 MED ORDER — TOPIRAMATE 25 MG PO TABS
50.0000 mg | ORAL_TABLET | Freq: Two times a day (BID) | ORAL | Status: DC
Start: 2015-10-06 — End: 2015-10-08
  Administered 2015-10-06 – 2015-10-07 (×2): 50 mg via ORAL
  Filled 2015-10-06 (×2): qty 2

## 2015-10-06 MED ORDER — BUDESONIDE-FORMOTEROL FUMARATE 160-4.5 MCG/ACT IN AERO
2.0000 | INHALATION_SPRAY | Freq: Two times a day (BID) | RESPIRATORY_TRACT | Status: DC
  Administered 2015-10-06 – 2015-10-07 (×2): 2 via RESPIRATORY_TRACT
  Filled 2015-10-06: qty 6

## 2015-10-06 MED ORDER — ULIPRISTAL ACETATE 30 MG PO TABS
ORAL_TABLET | ORAL | Status: AC
  Filled 2015-10-06: qty 1

## 2015-10-06 MED ORDER — ESCITALOPRAM OXALATE 10 MG PO TABS
20.0000 mg | ORAL_TABLET | Freq: Every day | ORAL | Status: DC
  Administered 2015-10-06 – 2015-10-07 (×2): 20 mg via ORAL
  Filled 2015-10-06 (×2): qty 2

## 2015-10-06 MED ORDER — CEFIXIME 400 MG PO CAPS
ORAL_CAPSULE | ORAL | Status: AC
  Administered 2015-10-06: 400 mg via ORAL
  Filled 2015-10-06: qty 1

## 2015-10-06 MED ORDER — MONTELUKAST SODIUM 10 MG PO TABS
10.0000 mg | ORAL_TABLET | Freq: Every day | ORAL | Status: DC
  Administered 2015-10-06: 10 mg via ORAL
  Filled 2015-10-06 (×2): qty 1

## 2015-10-06 MED ORDER — TRAZODONE HCL 50 MG PO TABS
150.0000 mg | ORAL_TABLET | Freq: Every day | ORAL | Status: DC
  Administered 2015-10-06: 150 mg via ORAL
  Filled 2015-10-06: qty 1

## 2015-10-06 MED ORDER — CLONAZEPAM 0.5 MG PO TABS
0.5000 mg | ORAL_TABLET | Freq: Two times a day (BID) | ORAL | Status: DC
  Administered 2015-10-06 – 2015-10-07 (×2): 0.5 mg via ORAL
  Filled 2015-10-06 (×2): qty 1

## 2015-10-06 MED ORDER — ARIPIPRAZOLE 10 MG PO TABS
10.0000 mg | ORAL_TABLET | Freq: Every day | ORAL | Status: DC
  Administered 2015-10-06 – 2015-10-07 (×2): 10 mg via ORAL
  Filled 2015-10-06 (×2): qty 1

## 2015-10-06 MED ORDER — PANTOPRAZOLE SODIUM 40 MG PO TBEC
40.0000 mg | DELAYED_RELEASE_TABLET | Freq: Every day | ORAL | Status: DC
  Administered 2015-10-06 – 2015-10-07 (×2): 40 mg via ORAL
  Filled 2015-10-06 (×2): qty 1

## 2015-10-06 MED ORDER — AZITHROMYCIN 1 G PO PACK
PACK | ORAL | Status: AC
  Administered 2015-10-06: 1 g via ORAL
  Filled 2015-10-06: qty 1

## 2015-10-06 NOTE — ED Notes (Signed)
Called and spoke with [name] of [facility].  Mr. Jamie Ross will call his supervisor to determine next steps r/t returning patient to group home after discharge.
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
## 2015-10-06 NOTE — ED Notes (Addendum)
Per Jamie Ross and the standing order for non-pregnant SANE patient, the Patient needs to take the Metronidazole at home with her next heavy meal.
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
## 2015-10-06 NOTE — ED Notes (Signed)
Jannette Fogo, Director of Facility [phone]   Advised of pending IVC paperwork to be delivered

## 2015-10-07 DIAGNOSIS — F329 Major depressive disorder, single episode, unspecified: Secondary | ICD-10-CM | POA: Diagnosis not present

## 2015-10-07 MED ORDER — METRONIDAZOLE 500 MG PO TABS: 2000.0000 mg | ORAL_TABLET | Freq: Once | ORAL | Status: DC

## 2015-10-07 MED ORDER — ONDANSETRON 4 MG PO TBDP: 4.0000 mg | ORAL_TABLET | Freq: Once | ORAL | Status: DC

## 2015-10-07 NOTE — ED Notes (Signed)
NAD noted at this time. Pt sitting up in bed with lights off. Pt requests to see social worker, states "I want to go home". Respirations are even and unlabored. Patient denies needs at this time.

## 2015-10-07 NOTE — ED Notes (Signed)
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
## 2015-10-07 NOTE — ED Notes (Signed)
BEHAVIORAL HEALTH ROUNDING Patient sleeping: Yes.   Patient alert and oriented: not applicable SLEEPING Behavior appropriate: Yes.  ; If no, describe: SLEEPING Nutrition and fluids offered: No SLEEPING Toileting and hygiene offered: NoSLEEPING Sitter present: not applicable Law enforcement present: Yes ODS 

## 2015-10-07 NOTE — ED Notes (Signed)
ENVIRONMENTAL ASSESSMENT  Potentially harmful objects out of patient reach: Yes.  Personal belongings secured: Yes.  Patient dressed in hospital provided attire only: Yes.  Plastic bags out of patient reach: Yes.  Patient care equipment (cords, cables, call bells, lines, and drains) shortened, removed, or accounted for: Yes.  Equipment and supplies removed from bottom of stretcher: Yes.  Potentially toxic materials out of patient reach: Yes.  Sharps container removed or out of patient reach: Yes.   BEHAVIORAL HEALTH ROUNDING Patient sleeping: Yes.   Patient alert and oriented: not applicable SLEEPING Behavior appropriate: Yes.  ; If no, describe: SLEEPING Nutrition and fluids offered: No SLEEPING Toileting and hygiene offered: NoSLEEPING Sitter present: not applicable Law enforcement present: Yes ODS 
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
## 2015-10-07 NOTE — ED Notes (Signed)
ED BHU [facility] Is the patient under IVC or is there intent for IVC: No. Is the patient medically cleared: Yes.   Is there vacancy in the ED BHU: Yes.   Is the population mix appropriate for patient: No. Is the patient awaiting placement in inpatient or outpatient setting: No. Has the patient had a psychiatric consult: Yes.   Survey of unit performed for contraband, proper placement and condition of furniture, tampering with fixtures in bathroom, shower, and each patient room: Yes.  ; Findings: Unremarkable APPEARANCE/BEHAVIOR Calm, pt refuses to answer questions stating that she wants to go home today.  NEURO ASSESSMENT Orientation: place and person Hallucinations: No.None noted (Hallucinations) Speech: Normal Gait: normal RESPIRATORY ASSESSMENT Normal expansion.  Clear to auscultation.  No rales, rhonchi, or wheezing. CARDIOVASCULAR ASSESSMENT regular rate and rhythm, S1, S2 normal, no murmur, click, rub or gallop GASTROINTESTINAL ASSESSMENT soft, nontender, BS WNL, no r/g EXTREMITIES normal strength, tone, and muscle mass PLAN OF CARE Provide calm/safe environment. Vital signs assessed twice daily. ED BHU Assessment once each 12-hour shift. Collaborate with intake RN daily or as condition indicates. Assure the ED provider has rounded once each shift. Provide and encourage hygiene. Provide redirection as needed. Assess for escalating behavior; address immediately and inform ED provider.  Assess family dynamic and appropriateness for visitation as needed: Yes.  ; If necessary, describe findings:  Educate the patient/family about BHU procedures/visitation: Yes.  ; If necessary, describe findings:

## 2015-10-07 NOTE — ED Notes (Signed)
NAD noted at this time. Pt resting in bed with eyes closed. Respirations are noted to be even and unlabored at this time.

## 2015-10-07 NOTE — ED Notes (Signed)
NAD noted at this time. Pt resting in bed with her eyes closed. Respirations even and unlabored at this time. Pt awakens to mild stimuli.

## 2015-10-07 NOTE — ED Provider Notes (Addendum)
-----------------------------------------   5:56 PM on 10/07/2015 -----------------------------------------  Dr. Franchot Mimes of psychiatry has evaluated the patient and lifted IVC. The patient's group home will come and take her back to the group home tonight while [name] is working on alternate placement.  Joanne Gavel, MD 10/07/15 [id number]  Joanne Gavel, MD 10/07/15 [phone]

## 2015-10-07 NOTE — ED Notes (Signed)
Pt requests to go home. Explained to pt the delay of SW and Psychiatry needing to see her. Pt resting in bed with NAD at this time.

## 2015-10-07 NOTE — ED Notes (Signed)
NAD noted at this time. Pt resting in bed with eyes open. Respirations noted to be even and unlabored.

## 2015-10-07 NOTE — ED Notes (Signed)
NAD noted at this time. Pt laying back in bed. Denies needs at this time. Respirations are noted to be even and unlabored at this time.

## 2015-10-07 NOTE — ED Notes (Signed)
Pt resting in bed with her eyes closed at this time. NAD noted. Respirations noted to be even and unlabored. Pt awakens with mild stimuli.

## 2015-10-07 NOTE — ED Notes (Signed)
Patient assigned to appropriate care area. Patient oriented to unit/care area: Informed that, for their safety, care areas are designed for safety and monitored by security cameras at all times; and visiting hours explained to patient. Patient verbalizes understanding, and verbal contract for safety obtained. 

## 2015-10-07 NOTE — ED Notes (Signed)
BEHAVIORAL HEALTH ROUNDING Patient sleeping: No. Patient alert and oriented: yes Behavior appropriate: Yes.  ; If no, describe:  Nutrition and fluids offered: Yes Toileting and hygiene offered: Yes  Sitter present: yes Law enforcement present: Yes ODS  

## 2015-10-07 NOTE — ED Notes (Signed)
Pt waiting on Group home member to pick patient up.

## 2015-11-27 DIAGNOSIS — R895 Abnormal microbiological findings in specimens from other organs, systems and tissues: Secondary | ICD-10-CM | POA: Diagnosis not present

## 2016-01-28 ENCOUNTER — Emergency Department (HOSPITAL_COMMUNITY): Payer: Medicare Other

## 2016-01-28 ENCOUNTER — Encounter (HOSPITAL_COMMUNITY): Payer: Self-pay | Admitting: Emergency Medicine

## 2016-01-28 ENCOUNTER — Emergency Department (HOSPITAL_COMMUNITY)
Admission: EM | Admit: 2016-01-28 | Discharge: 2016-01-29 | Disposition: A | Discharge status: Home / Self Care | Payer: Medicare Other | Attending: Emergency Medicine | Admitting: Emergency Medicine

## 2016-01-28 DIAGNOSIS — F419 Anxiety disorder, unspecified: Secondary | ICD-10-CM | POA: Insufficient documentation

## 2016-01-28 DIAGNOSIS — Z79899 Other long term (current) drug therapy: Secondary | ICD-10-CM | POA: Diagnosis not present

## 2016-01-28 DIAGNOSIS — Z792 Long term (current) use of antibiotics: Secondary | ICD-10-CM | POA: Diagnosis not present

## 2016-01-28 DIAGNOSIS — K219 Gastro-esophageal reflux disease without esophagitis: Secondary | ICD-10-CM | POA: Insufficient documentation

## 2016-01-28 DIAGNOSIS — K5900 Constipation, unspecified: Secondary | ICD-10-CM | POA: Insufficient documentation

## 2016-01-28 DIAGNOSIS — Z880 Allergy status to penicillin: Secondary | ICD-10-CM | POA: Insufficient documentation

## 2016-01-28 DIAGNOSIS — F17210 Nicotine dependence, cigarettes, uncomplicated: Secondary | ICD-10-CM | POA: Diagnosis not present

## 2016-01-28 DIAGNOSIS — F4310 Post-traumatic stress disorder, unspecified: Secondary | ICD-10-CM | POA: Diagnosis not present

## 2016-01-28 DIAGNOSIS — Z79818 Long term (current) use of other agents affecting estrogen receptors and estrogen levels: Secondary | ICD-10-CM | POA: Insufficient documentation

## 2016-01-28 DIAGNOSIS — Z7951 Long term (current) use of inhaled steroids: Secondary | ICD-10-CM | POA: Insufficient documentation

## 2016-01-28 DIAGNOSIS — Z62810 Personal history of physical and sexual abuse in childhood: Secondary | ICD-10-CM | POA: Diagnosis not present

## 2016-01-28 DIAGNOSIS — J45909 Unspecified asthma, uncomplicated: Secondary | ICD-10-CM | POA: Diagnosis not present

## 2016-01-28 DIAGNOSIS — Z9981 Dependence on supplemental oxygen: Secondary | ICD-10-CM | POA: Insufficient documentation

## 2016-01-28 DIAGNOSIS — G4730 Sleep apnea, unspecified: Secondary | ICD-10-CM | POA: Diagnosis not present

## 2016-01-28 DIAGNOSIS — Z87448 Personal history of other diseases of urinary system: Secondary | ICD-10-CM | POA: Diagnosis not present

## 2016-01-28 DIAGNOSIS — F32A Depression, unspecified: Secondary | ICD-10-CM

## 2016-01-28 DIAGNOSIS — F329 Major depressive disorder, single episode, unspecified: Secondary | ICD-10-CM | POA: Diagnosis not present

## 2016-01-28 DIAGNOSIS — Z008 Encounter for other general examination: Secondary | ICD-10-CM | POA: Diagnosis present

## 2016-01-28 LAB — URINALYSIS, ROUTINE W REFLEX MICROSCOPIC
BILIRUBIN URINE: NEGATIVE
GLUCOSE, UA: NEGATIVE mg/dL
HGB URINE DIPSTICK: NEGATIVE
Ketones, ur: NEGATIVE mg/dL
Nitrite: NEGATIVE
PROTEIN: NEGATIVE mg/dL
Specific Gravity, Urine: 1.015 (ref 1.005–1.030)
pH: 7.5 (ref 5.0–8.0)

## 2016-01-28 LAB — URINE MICROSCOPIC-ADD ON: RBC / HPF: NONE SEEN RBC/hpf (ref 0–5)

## 2016-01-28 LAB — COMPREHENSIVE METABOLIC PANEL
ALBUMIN: 3.9 g/dL (ref 3.5–5.0)
ALT: 10 U/L — ABNORMAL LOW (ref 14–54)
AST: 14 U/L — AB (ref 15–41)
Alkaline Phosphatase: 82 U/L (ref 38–126)
Anion gap: 7 (ref 5–15)
BUN: 13 mg/dL (ref 6–20)
CHLORIDE: 107 mmol/L (ref 101–111)
CO2: 24 mmol/L (ref 22–32)
Calcium: 9.3 mg/dL (ref 8.9–10.3)
Creatinine, Ser: 0.85 mg/dL (ref 0.44–1.00)
GFR calc Af Amer: >60 mL/min (ref >60)
GFR calc non Af Amer: >60 mL/min (ref >60)
GLUCOSE: 96 mg/dL (ref 65–99)
POTASSIUM: 3.8 mmol/L (ref 3.5–5.1)
SODIUM: 138 mmol/L (ref 135–145)
TOTAL PROTEIN: 7 g/dL (ref 6.5–8.1)
Total Bilirubin: 0.5 mg/dL (ref 0.3–1.2)

## 2016-01-28 LAB — RAPID URINE DRUG SCREEN, HOSP PERFORMED
AMPHETAMINES: NOT DETECTED
BARBITURATES: NOT DETECTED
BENZODIAZEPINES: NOT DETECTED
COCAINE: NOT DETECTED
Opiates: NOT DETECTED
Tetrahydrocannabinol: NOT DETECTED

## 2016-01-28 LAB — CBC WITH DIFFERENTIAL/PLATELET
Basophils Absolute: 0 10*3/uL (ref 0.0–0.1)
Basophils Relative: 0 %
EOS PCT: 1 %
Eosinophils Absolute: 0.2 10*3/uL (ref 0.0–0.7)
HEMATOCRIT: 39 % (ref 36.0–46.0)
Hemoglobin: 13.4 g/dL (ref 12.0–15.0)
LYMPHS ABS: 3.3 10*3/uL (ref 0.7–4.0)
LYMPHS PCT: 28 %
MCH: 31.6 pg (ref 26.0–34.0)
MCHC: 34.4 g/dL (ref 30.0–36.0)
MCV: 92 fL (ref 78.0–100.0)
MONO ABS: 0.8 10*3/uL (ref 0.1–1.0)
MONOS PCT: 7 %
NEUTROS ABS: 7.4 10*3/uL (ref 1.7–7.7)
Neutrophils Relative %: 64 %
PLATELETS: 293 10*3/uL (ref 150–400)
RBC: 4.24 MIL/uL (ref 3.87–5.11)
RDW: 12.7 % (ref 11.5–15.5)
WBC: 11.7 10*3/uL — ABNORMAL HIGH (ref 4.0–10.5)

## 2016-01-28 LAB — I-STAT BETA HCG BLOOD, ED (MC, WL, AP ONLY): I-stat hCG, quantitative: <5 m[IU]/mL (ref <5)

## 2016-01-28 LAB — ETHANOL: Alcohol, Ethyl (B): <5 mg/dL (ref <5)

## 2016-01-28 LAB — LIPASE, BLOOD: Lipase: 25 U/L (ref 11–51)

## 2016-01-28 MED ORDER — ARIPIPRAZOLE 10 MG PO TABS
ORAL_TABLET | ORAL | Status: AC
  Filled 2016-01-28: qty 1

## 2016-01-28 MED ORDER — PANTOPRAZOLE SODIUM 40 MG PO TBEC
40.0000 mg | DELAYED_RELEASE_TABLET | Freq: Every day | ORAL | Status: DC
  Administered 2016-01-28: 40 mg via ORAL
  Filled 2016-01-28: qty 1

## 2016-01-28 MED ORDER — GABAPENTIN 100 MG PO CAPS
ORAL_CAPSULE | ORAL | Status: AC
  Filled 2016-01-28: qty 1

## 2016-01-28 MED ORDER — ONDANSETRON HCL 4 MG PO TABS: 4.0000 mg | ORAL_TABLET | Freq: Three times a day (TID) | ORAL | Status: DC | PRN

## 2016-01-28 MED ORDER — ESCITALOPRAM OXALATE 10 MG PO TABS
ORAL_TABLET | ORAL | Status: AC
  Filled 2016-01-28: qty 2

## 2016-01-28 MED ORDER — ARIPIPRAZOLE 10 MG PO TABS
10.0000 mg | ORAL_TABLET | Freq: Every day | ORAL | Status: DC
  Administered 2016-01-28: 10 mg via ORAL
  Filled 2016-01-28 (×3): qty 1

## 2016-01-28 MED ORDER — ALBUTEROL SULFATE HFA 108 (90 BASE) MCG/ACT IN AERS: 2.0000 | INHALATION_SPRAY | Freq: Four times a day (QID) | RESPIRATORY_TRACT | Status: DC | PRN

## 2016-01-28 MED ORDER — GABAPENTIN 400 MG PO CAPS
400.0000 mg | ORAL_CAPSULE | Freq: Three times a day (TID) | ORAL | Status: DC
  Administered 2016-01-28 (×2): 400 mg via ORAL
  Filled 2016-01-28: qty 1

## 2016-01-28 MED ORDER — LORAZEPAM 1 MG PO TABS
1.0000 mg | ORAL_TABLET | ORAL | Status: DC | PRN
  Administered 2016-01-28: 1 mg via ORAL
  Filled 2016-01-28: qty 1

## 2016-01-28 MED ORDER — ESTRADIOL 1 MG PO TABS
2.0000 mg | ORAL_TABLET | Freq: Every day | ORAL | Status: DC
  Filled 2016-01-28 (×2): qty 1

## 2016-01-28 MED ORDER — TRAZODONE HCL 50 MG PO TABS
150.0000 mg | ORAL_TABLET | Freq: Every day | ORAL | Status: DC
  Administered 2016-01-28: 150 mg via ORAL
  Filled 2016-01-28: qty 3

## 2016-01-28 MED ORDER — LORAZEPAM 1 MG PO TABS
1.0000 mg | ORAL_TABLET | Freq: Once | ORAL | Status: AC
  Administered 2016-01-28: 1 mg via ORAL
  Filled 2016-01-28: qty 1

## 2016-01-28 MED ORDER — GABAPENTIN 300 MG PO CAPS
ORAL_CAPSULE | ORAL | Status: AC
  Filled 2016-01-28: qty 1

## 2016-01-28 MED ORDER — ACETAMINOPHEN 325 MG PO TABS
650.0000 mg | ORAL_TABLET | Freq: Once | ORAL | Status: AC
  Administered 2016-01-28: 650 mg via ORAL
  Filled 2016-01-28: qty 2

## 2016-01-28 MED ORDER — ESCITALOPRAM OXALATE 10 MG PO TABS
20.0000 mg | ORAL_TABLET | Freq: Every day | ORAL | Status: DC
  Administered 2016-01-28: 20 mg via ORAL
  Filled 2016-01-28 (×3): qty 1
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
## 2016-01-28 NOTE — ED Notes (Signed)
Pt states the spicy dinner is hurting her stomach 10/10. Pt states she uses bipap and 02 at night, 02 97% on room air at present
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
## 2016-01-28 NOTE — ED Notes (Signed)
Gave patient meal tray.
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
## 2016-01-28 NOTE — ED Notes (Signed)
Patient belongings put in locker room consist of baby doll, pants, shirt, sports bra, socks, and slippers.
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
## 2016-01-28 NOTE — ED Notes (Signed)
Pt complaining not feeling well, states stomach continue to be upset from dinner, pt wanting to rest, asking for night time meds, something to relax, hoping to rest. Prn orders given
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
## 2016-01-28 NOTE — ED Notes (Signed)
Called [facility] for night meds, pt o2 98% on room air

## 2016-01-29 DIAGNOSIS — F329 Major depressive disorder, single episode, unspecified: Secondary | ICD-10-CM | POA: Diagnosis not present

## 2016-01-29 NOTE — ED Provider Notes (Signed)
Patient cleared by behavioral health for discharge back to group home.  Fredia Sorrow, MD 01/29/16 [phone]

## 2016-01-29 NOTE — ED Notes (Signed)
Pt sleeping 02 checked, 93% on room air, pt placed on 2L [place]

## 2016-01-29 NOTE — ED Notes (Signed)
Ann here from [facility] [facility] to pick up patient. D/C papers given and reviewed.

## 2016-01-29 NOTE — ED Notes (Signed)
[facility] has been notified by [facility], they are sending someone to pick up patient.

## 2016-01-29 NOTE — ED Notes (Addendum)
Pt. 96 % on 2L, oxygen removed, pt. awake and eating breakfast.

## 2016-01-29 NOTE — Discharge Instructions (Signed)
The patient cleared by behavioral health for discharge back to group home.

## 2016-01-29 NOTE — Progress Notes (Signed)
Per telepsychiatry evaluation this morning, pt stable for d/c.  Called pt's legal Holly  [phone]. Informed her of recommendation pt be d/c'd home to group home. Guardian in agreement. Provided number for [facility] [phone]. CSW called number above and spoke with administrator Elza Rafter. Ms. Laverta Baltimore states staff member will be coming to [facility] to pick pt up. Inquired as to medication changes made, and CSW explained no medication recommendations were made due to nature of ED visit, and that changes can be pursues with pt's provider. Ms. Laverta Baltimore expressed understanding.   Sharren Bridge, MSW, LCSW Clinical Social Work, Disposition  01/29/2016 [phone]

## 2016-01-29 NOTE — ED Notes (Signed)
Pt dressed, personal belongings at bedside (baby doll and keys).

## 2016-01-29 NOTE — ED Notes (Signed)
Pt upset and tearful, states she does not want to go back to group home and if she does she will just act out again and come back up here.

## 2016-01-29 NOTE — Progress Notes (Signed)
Legal guardian called back to request copy of pt's psych eval and EDP note be faxed to her at [phone]. CSW inquired as to getting a copy of pt's psychological testing or document noting IQ score to keep on file, and guardian states she does not have immediate access to copy but will send if located. States "I believe her IQ is 69, she has moderate developmental delay."  Sharren Bridge, MSW, LCSW Clinical Social Work, Disposition  01/29/2016 [phone]
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
## 2016-03-05 ENCOUNTER — Encounter (HOSPITAL_COMMUNITY): Payer: Self-pay | Admitting: Emergency Medicine

## 2016-03-05 ENCOUNTER — Emergency Department (HOSPITAL_COMMUNITY)
Admission: EM | Admit: 2016-03-05 | Discharge: 2016-03-24 | Disposition: A | Discharge status: Other Acute Inpatient Hospital | Payer: Medicare Other | Attending: Emergency Medicine | Admitting: Emergency Medicine

## 2016-03-05 DIAGNOSIS — J45909 Unspecified asthma, uncomplicated: Secondary | ICD-10-CM | POA: Diagnosis not present

## 2016-03-05 DIAGNOSIS — F329 Major depressive disorder, single episode, unspecified: Secondary | ICD-10-CM | POA: Diagnosis not present

## 2016-03-05 DIAGNOSIS — Z9981 Dependence on supplemental oxygen: Secondary | ICD-10-CM | POA: Insufficient documentation

## 2016-03-05 DIAGNOSIS — Z880 Allergy status to penicillin: Secondary | ICD-10-CM | POA: Diagnosis not present

## 2016-03-05 DIAGNOSIS — F39 Unspecified mood [affective] disorder: Secondary | ICD-10-CM | POA: Diagnosis present

## 2016-03-05 DIAGNOSIS — R45851 Suicidal ideations: Secondary | ICD-10-CM | POA: Diagnosis present

## 2016-03-05 DIAGNOSIS — Z79899 Other long term (current) drug therapy: Secondary | ICD-10-CM | POA: Insufficient documentation

## 2016-03-05 DIAGNOSIS — R456 Violent behavior: Secondary | ICD-10-CM | POA: Diagnosis not present

## 2016-03-05 DIAGNOSIS — Z7951 Long term (current) use of inhaled steroids: Secondary | ICD-10-CM | POA: Insufficient documentation

## 2016-03-05 DIAGNOSIS — R441 Visual hallucinations: Secondary | ICD-10-CM | POA: Diagnosis not present

## 2016-03-05 DIAGNOSIS — K219 Gastro-esophageal reflux disease without esophagitis: Secondary | ICD-10-CM | POA: Insufficient documentation

## 2016-03-05 DIAGNOSIS — R440 Auditory hallucinations: Secondary | ICD-10-CM | POA: Diagnosis not present

## 2016-03-05 DIAGNOSIS — F419 Anxiety disorder, unspecified: Secondary | ICD-10-CM | POA: Diagnosis not present

## 2016-03-05 DIAGNOSIS — F17210 Nicotine dependence, cigarettes, uncomplicated: Secondary | ICD-10-CM | POA: Insufficient documentation

## 2016-03-05 DIAGNOSIS — G4730 Sleep apnea, unspecified: Secondary | ICD-10-CM | POA: Insufficient documentation

## 2016-03-05 DIAGNOSIS — X838XXA Intentional self-harm by other specified means, initial encounter: Secondary | ICD-10-CM

## 2016-03-05 DIAGNOSIS — Z3202 Encounter for pregnancy test, result negative: Secondary | ICD-10-CM | POA: Insufficient documentation

## 2016-03-05 DIAGNOSIS — Z87448 Personal history of other diseases of urinary system: Secondary | ICD-10-CM | POA: Diagnosis not present

## 2016-03-05 DIAGNOSIS — T1491 Suicide attempt: Secondary | ICD-10-CM | POA: Insufficient documentation

## 2016-03-05 DIAGNOSIS — R4689 Other symptoms and signs involving appearance and behavior: Secondary | ICD-10-CM

## 2016-03-05 DIAGNOSIS — F4310 Post-traumatic stress disorder, unspecified: Secondary | ICD-10-CM | POA: Diagnosis not present

## 2016-03-05 DIAGNOSIS — R4589 Other symptoms and signs involving emotional state: Secondary | ICD-10-CM

## 2016-03-05 LAB — COMPREHENSIVE METABOLIC PANEL
ALK PHOS: 83 U/L (ref 38–126)
ALT: 12 U/L — AB (ref 14–54)
AST: 14 U/L — ABNORMAL LOW (ref 15–41)
Albumin: 4 g/dL (ref 3.5–5.0)
Anion gap: 9 (ref 5–15)
BILIRUBIN TOTAL: 0.3 mg/dL (ref 0.3–1.2)
BUN: 12 mg/dL (ref 6–20)
CO2: 23 mmol/L (ref 22–32)
CREATININE: 0.82 mg/dL (ref 0.44–1.00)
Calcium: 9.3 mg/dL (ref 8.9–10.3)
Chloride: 108 mmol/L (ref 101–111)
GFR calc Af Amer: >60 mL/min (ref >60)
GLUCOSE: 106 mg/dL — AB (ref 65–99)
POTASSIUM: 4.1 mmol/L (ref 3.5–5.1)
SODIUM: 140 mmol/L (ref 135–145)
TOTAL PROTEIN: 7.5 g/dL (ref 6.5–8.1)

## 2016-03-05 LAB — CBC WITH DIFFERENTIAL/PLATELET
BASOS ABS: 0 10*3/uL (ref 0.0–0.1)
BASOS PCT: 0 %
EOS PCT: 1 %
Eosinophils Absolute: 0.1 10*3/uL (ref 0.0–0.7)
HEMATOCRIT: 38.7 % (ref 36.0–46.0)
Hemoglobin: 13.3 g/dL (ref 12.0–15.0)
Lymphocytes Relative: 24 %
Lymphs Abs: 3.1 10*3/uL (ref 0.7–4.0)
MCH: 31.1 pg (ref 26.0–34.0)
MCHC: 34.4 g/dL (ref 30.0–36.0)
MCV: 90.4 fL (ref 78.0–100.0)
MONO ABS: 0.6 10*3/uL (ref 0.1–1.0)
MONOS PCT: 5 %
NEUTROS ABS: 9.3 10*3/uL — AB (ref 1.7–7.7)
Neutrophils Relative %: 70 %
PLATELETS: 312 10*3/uL (ref 150–400)
RBC: 4.28 MIL/uL (ref 3.87–5.11)
RDW: 13.4 % (ref 11.5–15.5)
WBC: 13.2 10*3/uL — ABNORMAL HIGH (ref 4.0–10.5)

## 2016-03-05 LAB — RAPID URINE DRUG SCREEN, HOSP PERFORMED
Amphetamines: NOT DETECTED
BARBITURATES: NOT DETECTED
BENZODIAZEPINES: NOT DETECTED
Cocaine: NOT DETECTED
Opiates: NOT DETECTED
Tetrahydrocannabinol: NOT DETECTED

## 2016-03-05 LAB — ETHANOL

## 2016-03-05 LAB — POC URINE PREG, ED: PREG TEST UR: NEGATIVE

## 2016-03-05 LAB — ACETAMINOPHEN LEVEL

## 2016-03-05 LAB — SALICYLATE LEVEL

## 2016-03-05 MED ORDER — MOMETASONE FURO-FORMOTEROL FUM 200-5 MCG/ACT IN AERO
2.0000 | INHALATION_SPRAY | Freq: Two times a day (BID) | RESPIRATORY_TRACT | Status: DC
  Administered 2016-03-06 – 2016-03-24 (×37): 2 via RESPIRATORY_TRACT
  Filled 2016-03-05 (×3): qty 8.8

## 2016-03-05 MED ORDER — IPRATROPIUM BROMIDE 0.02 % IN SOLN
0.5000 mg | Freq: Four times a day (QID) | RESPIRATORY_TRACT | Status: DC | PRN
  Administered 2016-03-21 – 2016-03-23 (×2): 0.5 mg via RESPIRATORY_TRACT
  Filled 2016-03-05 (×2): qty 2.5

## 2016-03-05 MED ORDER — GABAPENTIN 400 MG PO CAPS
400.0000 mg | ORAL_CAPSULE | Freq: Three times a day (TID) | ORAL | Status: DC
  Administered 2016-03-05 – 2016-03-24 (×54): 400 mg via ORAL
  Filled 2016-03-05 (×53): qty 1

## 2016-03-05 MED ORDER — ONDANSETRON HCL 4 MG PO TABS
4.0000 mg | ORAL_TABLET | Freq: Three times a day (TID) | ORAL | Status: DC | PRN
  Administered 2016-03-16: 4 mg via ORAL
  Filled 2016-03-05: qty 1

## 2016-03-05 MED ORDER — IBUPROFEN 200 MG PO TABS
600.0000 mg | ORAL_TABLET | Freq: Three times a day (TID) | ORAL | Status: DC | PRN
  Administered 2016-03-06 – 2016-03-21 (×11): 600 mg via ORAL
  Filled 2016-03-05 (×11): qty 3

## 2016-03-05 MED ORDER — IPRATROPIUM BROMIDE HFA 17 MCG/ACT IN AERS
2.0000 | INHALATION_SPRAY | Freq: Three times a day (TID) | RESPIRATORY_TRACT | Status: DC
  Administered 2016-03-06 – 2016-03-24 (×51): 2 via RESPIRATORY_TRACT
  Filled 2016-03-05 (×2): qty 12.9

## 2016-03-05 MED ORDER — ALBUTEROL SULFATE HFA 108 (90 BASE) MCG/ACT IN AERS
2.0000 | INHALATION_SPRAY | RESPIRATORY_TRACT | Status: DC | PRN
  Administered 2016-03-06: 2 via RESPIRATORY_TRACT
  Filled 2016-03-05: qty 6.7

## 2016-03-05 MED ORDER — ESTRADIOL 2 MG PO TABS
2.0000 mg | ORAL_TABLET | Freq: Every day | ORAL | Status: DC
  Administered 2016-03-06 – 2016-03-24 (×19): 2 mg via ORAL
  Filled 2016-03-05 (×19): qty 1

## 2016-03-05 MED ORDER — LOPERAMIDE HCL 2 MG PO CAPS: 2.0000 mg | ORAL_CAPSULE | Freq: Four times a day (QID) | ORAL | Status: DC | PRN

## 2016-03-05 MED ORDER — TOPIRAMATE 25 MG PO TABS
50.0000 mg | ORAL_TABLET | Freq: Two times a day (BID) | ORAL | Status: DC
  Administered 2016-03-05 – 2016-03-24 (×38): 50 mg via ORAL
  Filled 2016-03-05 (×37): qty 2

## 2016-03-05 MED ORDER — HYDROXYZINE HCL 25 MG PO TABS
50.0000 mg | ORAL_TABLET | Freq: Every day | ORAL | Status: DC
  Administered 2016-03-05 – 2016-03-23 (×19): 50 mg via ORAL
  Filled 2016-03-05 (×19): qty 2

## 2016-03-05 MED ORDER — POLYETHYLENE GLYCOL 3350 17 GM/SCOOP PO POWD
17.0000 g | Freq: Two times a day (BID) | ORAL | Status: DC
  Filled 2016-03-05: qty 255

## 2016-03-05 MED ORDER — ALUM & MAG HYDROXIDE-SIMETH 200-200-20 MG/5ML PO SUSP: 30.0000 mL | ORAL | Status: DC | PRN

## 2016-03-05 MED ORDER — ARIPIPRAZOLE 5 MG PO TABS
5.0000 mg | ORAL_TABLET | Freq: Every day | ORAL | Status: DC
  Administered 2016-03-06 – 2016-03-08 (×3): 5 mg via ORAL
  Filled 2016-03-05 (×3): qty 1

## 2016-03-05 MED ORDER — BREXPIPRAZOLE 1 MG PO TABS
1.0000 mg | ORAL_TABLET | Freq: Every morning | ORAL | Status: DC
  Filled 2016-03-05: qty 1

## 2016-03-05 MED ORDER — MONTELUKAST SODIUM 10 MG PO TABS
10.0000 mg | ORAL_TABLET | Freq: Every day | ORAL | Status: DC
  Administered 2016-03-06 – 2016-03-23 (×18): 10 mg via ORAL
  Filled 2016-03-05 (×24): qty 1

## 2016-03-05 MED ORDER — ESCITALOPRAM OXALATE 10 MG PO TABS
20.0000 mg | ORAL_TABLET | Freq: Every day | ORAL | Status: DC
  Administered 2016-03-06 – 2016-03-24 (×19): 20 mg via ORAL
  Filled 2016-03-05 (×20): qty 2

## 2016-03-05 MED ORDER — PANTOPRAZOLE SODIUM 40 MG PO TBEC
40.0000 mg | DELAYED_RELEASE_TABLET | Freq: Every day | ORAL | Status: DC
  Administered 2016-03-06 – 2016-03-24 (×19): 40 mg via ORAL
  Filled 2016-03-05 (×16): qty 1

## 2016-03-05 MED ORDER — SODIUM CHLORIDE 0.9 % IV BOLUS (SEPSIS): 1000.0000 mL | Freq: Once | INTRAVENOUS | Status: DC

## 2016-03-05 MED ORDER — ACETAMINOPHEN 325 MG PO TABS
650.0000 mg | ORAL_TABLET | ORAL | Status: DC | PRN
  Administered 2016-03-08 – 2016-03-20 (×9): 650 mg via ORAL
  Filled 2016-03-05 (×10): qty 2

## 2016-03-05 MED ORDER — ALBUTEROL SULFATE (2.5 MG/3ML) 0.083% IN NEBU
2.5000 mg | INHALATION_SOLUTION | Freq: Four times a day (QID) | RESPIRATORY_TRACT | Status: DC | PRN
  Filled 2016-03-05: qty 3

## 2016-03-05 MED ORDER — POLYETHYLENE GLYCOL 3350 17 G PO PACK
17.0000 g | PACK | Freq: Two times a day (BID) | ORAL | Status: DC
  Administered 2016-03-06 – 2016-03-22 (×18): 17 g via ORAL
  Filled 2016-03-05 (×40): qty 1

## 2016-03-05 MED ORDER — ZOLPIDEM TARTRATE 5 MG PO TABS
5.0000 mg | ORAL_TABLET | Freq: Every evening | ORAL | Status: DC | PRN
  Administered 2016-03-07 – 2016-03-18 (×6): 5 mg via ORAL
  Filled 2016-03-05 (×6): qty 1

## 2016-03-05 MED ORDER — ALIGN 4 MG PO CAPS
4.0000 mg | ORAL_CAPSULE | Freq: Every day | ORAL | Status: DC
  Administered 2016-03-06 – 2016-03-24 (×19): 4 mg via ORAL
  Filled 2016-03-05 (×19): qty 1
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
## 2016-03-05 NOTE — ED Notes (Signed)
Bed: WA29 Expected date:  Expected time:  Means of arrival:  Comments: Hold for consultation room
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
## 2016-03-05 NOTE — BH Assessment (Signed)
Assessment completed. Consulted Patriciaann Clan, PA-C who agrees that pt meets inpatient criteria. TTS to seek placement. Informed Margarita Mail, PA-C of the recommendation.
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
## 2016-03-06 DIAGNOSIS — F39 Unspecified mood [affective] disorder: Secondary | ICD-10-CM | POA: Diagnosis not present

## 2016-03-06 DIAGNOSIS — R440 Auditory hallucinations: Secondary | ICD-10-CM | POA: Insufficient documentation

## 2016-03-06 DIAGNOSIS — IMO0002 Reserved for concepts with insufficient information to code with codable children: Secondary | ICD-10-CM | POA: Insufficient documentation

## 2016-03-06 DIAGNOSIS — R456 Violent behavior: Secondary | ICD-10-CM | POA: Diagnosis not present
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
## 2016-03-06 NOTE — Progress Notes (Signed)
Per Dr. Dwyane Dee, pt declined at [facility] as she is not appropriate for unit programming.

## 2016-03-06 NOTE — Progress Notes (Addendum)
Pt is pleasant and cooperative. She state she hears voices that say, "do not go back there. " pt does have good eye contact and told the writer she still wants to hurt herself. Pt remains with a sitter . Pt is limited  And childlike at times. Pt stated, "I do not think my medicines are working. I keep hearing all these voices. Pts caregiver , Inez Catalina ,phoned earlier to check on the pt. [phone]. 7pm Report to the oncoming shift.
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
## 2016-03-06 NOTE — ED Notes (Signed)
Case Social Worker [place] [phone]

## 2016-03-06 NOTE — Progress Notes (Signed)
CSW consulted with NP who states that patient will be kept overnight for observation. CSW will speak with patient and provide community resources.  Willette Brace [id number] ED CSW 03/06/2016 6:40 PM

## 2016-03-07 DIAGNOSIS — R456 Violent behavior: Secondary | ICD-10-CM | POA: Diagnosis not present
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
## 2016-03-07 NOTE — ED Notes (Signed)
Pt awaiting placement per social work
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
## 2016-03-07 NOTE — ED Notes (Signed)
Social work is Engineer, mining
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
## 2016-03-08 DIAGNOSIS — R456 Violent behavior: Secondary | ICD-10-CM | POA: Diagnosis not present

## 2016-03-08 MED ORDER — ARIPIPRAZOLE 10 MG PO TABS
10.0000 mg | ORAL_TABLET | Freq: Every day | ORAL | Status: DC
  Administered 2016-03-09 – 2016-03-24 (×16): 10 mg via ORAL
  Filled 2016-03-08 (×16): qty 1

## 2016-03-08 NOTE — Progress Notes (Signed)
Reassessment  Patient is compliant with medications and rules on the unit.  Patient became upset today when she was not able to have her baby doll from the group home.  Staff accommodated her by making a doll like item for her to hold.  She has been calm although still complaining of visual hallucinations.      Chesley Noon, MSW, Darlyn Read [facility] Triage Specialist [phone]

## 2016-03-09 DIAGNOSIS — R456 Violent behavior: Secondary | ICD-10-CM | POA: Diagnosis not present

## 2016-03-09 NOTE — ED Notes (Signed)
Patient belongings in [place]

## 2016-03-09 NOTE — BH Assessment (Signed)
Patient was reassessed by TTS.   Patient was sitting in her room in a chair when assessed. When asked how she feels today patient states "sad" but was smiling and then started to laugh. This Probation officer stated that patient looked happy and patient states "I'm a little but not much." Patient then asked when she was going to go back to a group home.  Patient was informed that we were working on stabilization and then we would work towards a group home. Patient states that she does not want to go back to the group home that she is in.  Patient denies SI/HI at this time.  When asked about hallucinations patient states "I hear a lot of voices and stuff." When asked what the voices say, the patient paused, and then stated "the voices say I want to go home." Then patient states "the voices say if i go back to the other one I might have to hurt myself." When asked how long she was hearing the voices, the patient states "I don't hear voices." patient states "i heard voices before but not now, I want to go home, but to a new home." Patient denies questions or concerns at this time.    Consulted with Dr. Harrington Challenger and Charmaine Downs, PMH-NP who recommend patient be psychiatrically cleared.  Charmaine Downs, PMH-NOP saw patient face-to-face and recommends patient be psychiatrically cleared and consulted with CSW for placement.   Rosalin Hawking, LCSW Therapeutic Triage Specialist [place] 03/09/2016 1:32 PM
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
## 2016-03-10 DIAGNOSIS — R456 Violent behavior: Secondary | ICD-10-CM | POA: Diagnosis not present

## 2016-03-10 NOTE — Progress Notes (Signed)
No information/visits to be provided to Patient's family without permission by Patient's Guardian [name] [phone])  CSW received call from patient's guardian/ [name] [phone]. She states that she does not want any information to be given out to anyone except for [place] or the patient Case Manager or [employer]/ Abby Potash [phone].

## 2016-03-10 NOTE — ED Notes (Signed)
Mother at bedside for supervised visit. Pt remains calm and cooperative at present. Mother asked for medication list and referred to [name] for any documents.

## 2016-03-10 NOTE — ED Notes (Signed)
A man called to speak to patient. He identified himself as her father. Patient agreed to speak with her father. After there call, pt stated that he was not coming because"we wont let him". Pt remains cooperative and was not upset after call.

## 2016-03-10 NOTE — ED Notes (Signed)
A man called and identified himself as the patients father. Pt stated that she would be willing to allow a visit. [place] reviewed via phone.

## 2016-03-10 NOTE — ED Notes (Signed)
Jamie Ross, SW called to check on status of possible visit from father

## 2016-03-10 NOTE — ED Notes (Signed)
Pt remains cooperative and conversive

## 2016-03-10 NOTE — ED Notes (Signed)
Father at bedside. Stuffed animal given to pt after approved for use with this patient. Pt has 3 stuffed animals at bedside

## 2016-03-10 NOTE — ED Notes (Signed)
While administer scheduled medications, she was calm, cooperative, and pleasant. Dimmed lights after medication was taken. Sitter remains at bedside.

## 2016-03-10 NOTE — ED Notes (Addendum)
Pt called parents

## 2016-03-10 NOTE — ED Notes (Signed)
[place], called to clarify status of possible visit from patients father. [facility] reviewed with her. Guardian reassured that sitter will remain at bedside during visit. This RN was informed that the guardian continues to work on placement for this patient. Pt remain alert and cooperative

## 2016-03-10 NOTE — Progress Notes (Signed)
CSW reached out to patient's guardian/ [name] to inform her that patient is up to discharge. However, she did not answer the phone. CSW will continue to follow up.   Lourena Simmonds [phone]  Willette Brace [id number] ED CSW 03/10/2016 6:07 PM

## 2016-03-11 DIAGNOSIS — R456 Violent behavior: Secondary | ICD-10-CM | POA: Diagnosis not present

## 2016-03-11 NOTE — Progress Notes (Signed)
CSW completed PASRR screening for patient. PASRR number is pending at this time.  Willette Brace [id number] ED CSW 03/11/2016 5:20 PM

## 2016-03-11 NOTE — ED Notes (Signed)
Pt resting on stretcher with eyes closed, RR even and unlabored, NAD, sitter at bedside

## 2016-03-11 NOTE — Progress Notes (Addendum)
CSW received a telephone call from [name], [place]. She stated she received a missed call from [place], [place] and was returning the call. CSW informed Guardian that patient was cleared for discharge and ready for pick up. Guardian stated she was under the impression that patient would be going inpatient. CSW informed Guardian that patient had been stabilized in the ER and cleared psychiatrically. She stated she would need to speak with [employer], LME and call back.   CSW received call back from [place], [place] stating [employer], LME had located a female bed at [facility], [place], [place]. She stated patient had not been accepted at this facility at the time. She stated the Administrator, Hassan Rowan needed information on the patient including: FL2, PASSAR, H&P. She stated the facility is going to have someone come to the hospital and assess patient,however, she stated she would need to review the paperwork first. She stated the contact number for the Administrator is [phone] and the fax# is [phone].  [name] Guardian stated she wanted the same information that will be sent to the facility including medication list. Guardian stated her fax# is [phone].  CSW called [place], [place] and left a message for Hassan Rowan, Scientist, physiological for return call. CSW called back and left name and contact number with the secretary to ensure Administrator returns the call.  CSW received a telephone call from back from [facility], Scientist, physiological, [place], [place], [place]. She stated she would need the FL2, PASSAR, and H&P. She stated once the paperwork is reviewed, someone from the facility will come to complete a fact-to-face assessment with patient. She stated she was unsure if that would be today as she wants to review paperwork prior. CSW will fax this information regarding patient.  CSW began to complete the FL2. CSW spoke with  staff member at [facility]. She stated the system is currently down and CSW is unable to complete the FL2 at this time. CSW attempted a telephone call to Carney Bern, Administrator, [facility] to inform her of this information. No one answered the call at the time.  Genice Rouge [id number] ED CSW 03/11/2016 11:41 AM

## 2016-03-11 NOTE — ED Notes (Addendum)
Pt is sitting up in her bed waiting to see when she can go home. Pt is holding her stuffed animal bunny. She remains cooperative and pleasant. Pt does contract for safety and denies SI and HI. Pt has a Actuary. 1:55pm Pt is crying that the social worker has not spoken to her today. Pt stated, 'I want to live with my mom and dad and not go to a group home. " Report to oncoming shift (3:40pm )

## 2016-03-11 NOTE — ED Notes (Signed)
Assumed care of patient, pt resting on stretcher with eyes closed, RR even an unlabored, NAD. Sitter at bedside

## 2016-03-11 NOTE — NC FL2 (Signed)
[place] LEVEL OF CARE SCREENING TOOL     IDENTIFICATION  Patient Name: Jamie Ross Birthdate: [birth year] Sex: female Admission Date (Current Location): 03/05/2016  [place] and [place] Number:  Marina Gravel  ([id number] O) Facility and Address:  [facility],  [place] [address], [place]      Provider Number: [id number]  Attending Physician Name and Address:  Provider Default, MD  Relative Name and Phone Number:       Current Level of Care: Hospital Recommended Level of Care: [place] Prior Approval Number:    Date Approved/Denied:   PASRR Number:  (PASRR number )  Discharge Plan: Other (Comment) (ALF)    Current Diagnoses: Patient Active Problem List   Diagnosis Date Noted  . Mood disorder ([place]) 03/06/2016  . Auditory hallucinations   . Self-harm   . Piriformis syndrome 04/23/2015  . Fibromyalgia 04/23/2015  . 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 Cough 03/07/2014  . Chronic pain syndrome 02/07/2014  . Insomnia 02/07/2014  . Dermatitis 02/07/2014  . Atypical nevi 07/20/2013  . Acne 07/20/2013  . Hypokalemia 03/22/2013  . Depression 03/22/2013  . Chronic nausea 02/22/2013  . Chronic headaches 01/12/2013  . GERD (gastroesophageal reflux disease) 11/05/2012  . Irregular menses 11/05/2012  . Obesity 04/20/2012  . Edema 04/20/2012  . Seasonal allergies 04/20/2012  . Neuropathy ([place]) 02/12/2012  . Inverted nipple 02/12/2012  . Tachycardia 02/12/2012  . Development delay 01/27/2012    Orientation RESPIRATION BLADDER Height & Weight     Self, Time, Place  Normal Continent Weight:   Height:     BEHAVIORAL SYMPTOMS/MOOD NEUROLOGICAL BOWEL NUTRITION STATUS     (None.) Continent    AMBULATORY STATUS COMMUNICATION OF NEEDS Skin   Independent Verbally Normal                       Personal Care Assistance Level of Assistance   (CSW spoke with nurse who states that patient can complete ADL's  independently.  ) Bathing Assistance: Independent         Functional Limitations Info   (None.)          SPECIAL CARE FACTORS FREQUENCY                       Contractures Contractures Info: Not present    Additional Factors Info                  Current Medications (03/11/2016):  This is the current hospital active medication list Current Facility-Administered Medications  Medication Dose Route Frequency Provider Last Rate Last Dose  . acetaminophen (TYLENOL) tablet 650 mg  650 mg Oral Q4H PRN Margarita Mail, PA-C   650 mg at 03/11/16 1626  . albuterol (PROVENTIL HFA;VENTOLIN HFA) 108 (90 Base) MCG/ACT inhaler 2 puff  2 puff Inhalation Q4H PRN Margarita Mail, PA-C   2 puff at 03/06/16 0850  . albuterol (PROVENTIL) (2.5 MG/3ML) 0.083% nebulizer solution 2.5 mg  2.5 mg Nebulization Q6H PRN Margarita Mail, PA-C      . ALIGN CAPS 4 mg  4 mg Oral Daily Margarita Mail, PA-C   4 mg at 03/11/16 0935  . alum & mag hydroxide-simeth (MAALOX/MYLANTA) 200-200-20 MG/5ML suspension 30 mL  30 mL Oral PRN Margarita Mail, PA-C      . ARIPiprazole (ABILIFY) tablet 10 mg  10 mg Oral Daily Delfin Gant, NP   10 mg at 03/11/16 0935  . escitalopram (LEXAPRO)  tablet 20 mg  20 mg Oral Daily Margarita Mail, PA-C   20 mg at 03/11/16 1000  . estradiol (ESTRACE) tablet 2 mg  2 mg Oral Daily Margarita Mail, PA-C   2 mg at 03/11/16 0934  . gabapentin (NEURONTIN) capsule 400 mg  400 mg Oral TID Margarita Mail, PA-C   400 mg at 03/11/16 1626  . hydrOXYzine (ATARAX/VISTARIL) tablet 50 mg  50 mg Oral QHS Margarita Mail, PA-C   50 mg at 03/10/16 2113  . ibuprofen (ADVIL,MOTRIN) tablet 600 mg  600 mg Oral Q8H PRN Margarita Mail, PA-C   600 mg at 03/08/16 0243  . ipratropium (ATROVENT HFA) inhaler 2 puff  2 puff Inhalation TID Margarita Mail, PA-C   2 puff at 03/11/16 1625  . ipratropium (ATROVENT) nebulizer solution 0.5 mg  0.5 mg Nebulization Q6H PRN Margarita Mail, PA-C      . loperamide (IMODIUM)  capsule 2 mg  2 mg Oral QID PRN Margarita Mail, PA-C      . mometasone-formoterol (DULERA) 200-5 MCG/ACT inhaler 2 puff  2 puff Inhalation BID Margarita Mail, PA-C   2 puff at 03/11/16 0935  . montelukast (SINGULAIR) tablet 10 mg  10 mg Oral QHS Margarita Mail, PA-C   10 mg at 03/10/16 2114  . ondansetron (ZOFRAN) tablet 4 mg  4 mg Oral Q8H PRN Margarita Mail, PA-C      . pantoprazole (PROTONIX) EC tablet 40 mg  40 mg Oral Daily Margarita Mail, PA-C   40 mg at 03/11/16 0934  . polyethylene glycol (MIRALAX / GLYCOLAX) packet 17 g  17 g Oral BID Deno Etienne, DO   17 g at 03/11/16 0936  . topiramate (TOPAMAX) tablet 50 mg  50 mg Oral BID Margarita Mail, PA-C   50 mg at 03/11/16 0934  . zolpidem (AMBIEN) tablet 5 mg  5 mg Oral QHS PRN Margarita Mail, PA-C   5 mg at 03/08/16 2128   Current Outpatient Prescriptions  Medication Sig Dispense Refill  . acetaminophen (TYLENOL) 650 MG CR tablet Take 650 mg by mouth every 8 (eight) hours as needed for pain.    Marland Kitchen albuterol (PROAIR HFA) 108 (90 BASE) MCG/ACT inhaler Inhale 2 puffs into the lungs every 6 (six) hours as needed. For wheezing.    . ARIPiprazole (ABILIFY) 5 MG tablet Take 2.5 mg by mouth daily.     . ATROVENT HFA 17 MCG/ACT inhaler Take 2 puffs by mouth 3 (three) times daily.     . Brexpiprazole (REXULTI) 1 MG TABS Take 1 mg by mouth every morning.    . budesonide-formoterol (SYMBICORT) 160-4.5 MCG/ACT inhaler Inhale 2 puffs into the lungs 2 (two) times daily. 1 Inhaler 0  . escitalopram (LEXAPRO) 20 MG tablet Take 1 tablet (20 mg total) by mouth daily. 30 tablet 0  . estradiol (ESTRACE) 2 MG tablet Take 2 mg by mouth daily.    Marland Kitchen gabapentin (NEURONTIN) 400 MG capsule Take 1 capsule (400 mg total) by mouth 3 (three) times daily. 90 capsule 0  . hydrOXYzine (ATARAX/VISTARIL) 50 MG tablet Take 50 mg by mouth at bedtime.    . montelukast (SINGULAIR) 10 MG tablet Take 1 tablet (10 mg total) by mouth at bedtime. 30 tablet 0  . OXYGEN Place 2 L into the  nose at bedtime.    . pantoprazole (PROTONIX) 40 MG tablet Take 1 tablet (40 mg total) by mouth daily. 30 tablet 0  . polyethylene glycol powder (GLYCOLAX/MIRALAX) powder Take 17 g by mouth 2 (two)  times daily.    . Probiotic Product (ALIGN) 4 MG CAPS Take 4 mg by mouth daily.    Marland Kitchen topiramate (TOPAMAX) 25 MG tablet Take 50 mg by mouth 2 (two) times daily.    . [DISCONTINUED] hydrochlorothiazide (MICROZIDE) 12.5 MG capsule Take 12.5 mg by mouth daily.    . [DISCONTINUED] potassium chloride SA (K-DUR,KLOR-CON) 20 MEQ tablet Take 20 mEq by mouth daily.       Discharge Medications: Please see discharge summary for a list of discharge medications.  Relevant Imaging Results:  Relevant Lab Results:   Additional Information    Hugh Garrow R, LCSW

## 2016-03-12 DIAGNOSIS — R456 Violent behavior: Secondary | ICD-10-CM | POA: Diagnosis not present

## 2016-03-12 MED ORDER — LORAZEPAM 0.5 MG PO TABS
0.5000 mg | ORAL_TABLET | Freq: Once | ORAL | Status: AC
  Administered 2016-03-21: 0.5 mg via ORAL
  Filled 2016-03-12: qty 1

## 2016-03-12 MED ORDER — HYDROXYZINE HCL 25 MG PO TABS
25.0000 mg | ORAL_TABLET | Freq: Four times a day (QID) | ORAL | Status: DC | PRN
  Administered 2016-03-12 – 2016-03-22 (×7): 25 mg via ORAL
  Filled 2016-03-12 (×9): qty 1

## 2016-03-12 NOTE — ED Notes (Addendum)
PER PT'S GUARDIAN BETTY KNIGHT, NO INFORMATION IS TO BE GIVEN ABOUT PATIENT TO PATIENT'S PARENTS. THEY ARE ALLOWED ONE 30 MINUTES VISIT TODAY, 03/12/16. NURSING IS NOT TO DISCUSS PATEINT'S PLACEMENT OR DISCHARGE WITH PARENTS!

## 2016-03-12 NOTE — NC FL2 (Signed)
[place] LEVEL OF CARE SCREENING TOOL     IDENTIFICATION  Patient Name: Jamie Ross Birthdate: 03-12-1986 Sex: female Admission Date (Current Location): 03/05/2016  [place] and [place] Number:  Marina Gravel  ([id number] O) Facility and Address:  [facility],  [place] [address] [place]      Provider Number: [id number]  Attending Physician Name and Address:  Provider Default, MD  Relative Name and Phone Number:       Current Level of Care: Hospital Recommended Level of Care: [place] Prior Approval Number:    Date Approved/Denied:   PASRR Number:  (PASRR number )  Discharge Plan:  ([place])    Current Diagnoses: Patient Active Problem List   Diagnosis Date Noted  . Mood disorder ([place]) 03/06/2016  . Auditory hallucinations   . Self-harm   . Piriformis syndrome 04/23/2015  . Fibromyalgia 04/23/2015  . 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 Cough 03/07/2014  . Chronic pain syndrome 02/07/2014  . Insomnia 02/07/2014  . Dermatitis 02/07/2014  . Atypical nevi 07/20/2013  . Acne 07/20/2013  . Hypokalemia 03/22/2013  . Depression 03/22/2013  . Chronic nausea 02/22/2013  . Chronic headaches 01/12/2013  . GERD (gastroesophageal reflux disease) 11/05/2012  . Irregular menses 11/05/2012  . Obesity 04/20/2012  . Edema 04/20/2012  . Seasonal allergies 04/20/2012  . Neuropathy ([place]) 02/12/2012  . Inverted nipple 02/12/2012  . Tachycardia 02/12/2012  . Development delay 01/27/2012    Orientation RESPIRATION BLADDER Height & Weight     Self, Time, Place  Normal Continent Weight:   Height:     BEHAVIORAL SYMPTOMS/MOOD NEUROLOGICAL BOWEL NUTRITION STATUS     (None.) Continent Diet (Regular Diet)  AMBULATORY STATUS COMMUNICATION OF NEEDS Skin   Independent Verbally Normal                       Personal Care Assistance Level of Assistance  Bathing, Feeding, Dressing Bathing Assistance:  Independent Feeding assistance: Independent Dressing Assistance: Independent     Functional Limitations Info   Sight Info: Adequate (Sight is adequate)        SPECIAL CARE FACTORS FREQUENCY                       Contractures Contractures Info: Not present    Additional Factors Info  Code Status, Allergies (Full Code) Code Status Info: Full Code Allergies Info: Doxycycline, Penicillins, Risperidone, and Related, Septra, Depo-Provera           Current Medications (03/12/2016):  This is the current hospital active medication list Current Facility-Administered Medications  Medication Dose Route Frequency Provider Last Rate Last Dose  . acetaminophen (TYLENOL) tablet 650 mg  650 mg Oral Q4H PRN Margarita Mail, PA-C   650 mg at 03/11/16 1626  . albuterol (PROVENTIL HFA;VENTOLIN HFA) 108 (90 Base) MCG/ACT inhaler 2 puff  2 puff Inhalation Q4H PRN Margarita Mail, PA-C   2 puff at 03/06/16 0850  . albuterol (PROVENTIL) (2.5 MG/3ML) 0.083% nebulizer solution 2.5 mg  2.5 mg Nebulization Q6H PRN Margarita Mail, PA-C      . ALIGN CAPS 4 mg  4 mg Oral Daily Margarita Mail, PA-C   4 mg at 03/12/16 1016  . alum & mag hydroxide-simeth (MAALOX/MYLANTA) 200-200-20 MG/5ML suspension 30 mL  30 mL Oral PRN Margarita Mail, PA-C      . ARIPiprazole (ABILIFY) tablet 10 mg  10 mg Oral Daily Delfin Gant, NP   10  mg at 03/12/16 1016  . escitalopram (LEXAPRO) tablet 20 mg  20 mg Oral Daily Margarita Mail, PA-C   20 mg at 03/12/16 1016  . estradiol (ESTRACE) tablet 2 mg  2 mg Oral Daily Margarita Mail, PA-C   2 mg at 03/12/16 1016  . gabapentin (NEURONTIN) capsule 400 mg  400 mg Oral TID Margarita Mail, PA-C   400 mg at 03/12/16 1016  . hydrOXYzine (ATARAX/VISTARIL) tablet 50 mg  50 mg Oral QHS Margarita Mail, PA-C   50 mg at 03/11/16 2142  . ibuprofen (ADVIL,MOTRIN) tablet 600 mg  600 mg Oral Q8H PRN Margarita Mail, PA-C   600 mg at 03/08/16 0243  . ipratropium (ATROVENT HFA) inhaler 2 puff  2  puff Inhalation TID Margarita Mail, PA-C   2 puff at 03/12/16 1016  . ipratropium (ATROVENT) nebulizer solution 0.5 mg  0.5 mg Nebulization Q6H PRN Margarita Mail, PA-C      . loperamide (IMODIUM) capsule 2 mg  2 mg Oral QID PRN Margarita Mail, PA-C      . mometasone-formoterol (DULERA) 200-5 MCG/ACT inhaler 2 puff  2 puff Inhalation BID Margarita Mail, PA-C   2 puff at 03/12/16 1016  . montelukast (SINGULAIR) tablet 10 mg  10 mg Oral QHS Margarita Mail, PA-C   10 mg at 03/11/16 2143  . ondansetron (ZOFRAN) tablet 4 mg  4 mg Oral Q8H PRN Margarita Mail, PA-C      . pantoprazole (PROTONIX) EC tablet 40 mg  40 mg Oral Daily Margarita Mail, PA-C   40 mg at 03/12/16 1017  . polyethylene glycol (MIRALAX / GLYCOLAX) packet 17 g  17 g Oral BID Deno Etienne, DO   17 g at 03/11/16 2142  . topiramate (TOPAMAX) tablet 50 mg  50 mg Oral BID Margarita Mail, PA-C   50 mg at 03/12/16 1016  . zolpidem (AMBIEN) tablet 5 mg  5 mg Oral QHS PRN Margarita Mail, PA-C   5 mg at 03/08/16 2128   Current Outpatient Prescriptions  Medication Sig Dispense Refill  . acetaminophen (TYLENOL) 650 MG CR tablet Take 650 mg by mouth every 8 (eight) hours as needed for pain.    Marland Kitchen albuterol (PROAIR HFA) 108 (90 BASE) MCG/ACT inhaler Inhale 2 puffs into the lungs every 6 (six) hours as needed. For wheezing.    . ARIPiprazole (ABILIFY) 5 MG tablet Take 2.5 mg by mouth daily.     . ATROVENT HFA 17 MCG/ACT inhaler Take 2 puffs by mouth 3 (three) times daily.     . Brexpiprazole (REXULTI) 1 MG TABS Take 1 mg by mouth every morning.    . budesonide-formoterol (SYMBICORT) 160-4.5 MCG/ACT inhaler Inhale 2 puffs into the lungs 2 (two) times daily. 1 Inhaler 0  . escitalopram (LEXAPRO) 20 MG tablet Take 1 tablet (20 mg total) by mouth daily. 30 tablet 0  . estradiol (ESTRACE) 2 MG tablet Take 2 mg by mouth daily.    Marland Kitchen gabapentin (NEURONTIN) 400 MG capsule Take 1 capsule (400 mg total) by mouth 3 (three) times daily. 90 capsule 0  . hydrOXYzine  (ATARAX/VISTARIL) 50 MG tablet Take 50 mg by mouth at bedtime.    . montelukast (SINGULAIR) 10 MG tablet Take 1 tablet (10 mg total) by mouth at bedtime. 30 tablet 0  . OXYGEN Place 2 L into the nose at bedtime.    . pantoprazole (PROTONIX) 40 MG tablet Take 1 tablet (40 mg total) by mouth daily. 30 tablet 0  . polyethylene glycol powder (GLYCOLAX/MIRALAX)  powder Take 17 g by mouth 2 (two) times daily.    . Probiotic Product (ALIGN) 4 MG CAPS Take 4 mg by mouth daily.    Marland Kitchen topiramate (TOPAMAX) 25 MG tablet Take 50 mg by mouth 2 (two) times daily.    . [DISCONTINUED] hydrochlorothiazide (MICROZIDE) 12.5 MG capsule Take 12.5 mg by mouth daily.    . [DISCONTINUED] potassium chloride SA (K-DUR,KLOR-CON) 20 MEQ tablet Take 20 mEq by mouth daily.       Discharge Medications: Please see discharge summary for a list of discharge medications.  Relevant Imaging Results:  Relevant Lab Results:   Additional Information    Guadelupe Sabin, LCSW

## 2016-03-12 NOTE — Progress Notes (Addendum)
CSW faxed information to [facility], [place], [place] for the Administrator to review. Fax# [phone]. CSW spoke with Carney Bern and she states she would need to review the paperwork first and this could be sent without the PASSAR at the time. She stated she would then see if she would send anyone to assess patient face-to-face after review of patient's paperwork.   CSW has attempted three telephone calls to speak with Carney Bern, Administrator regarding faxed paperwork and to see if someone would be coming today for a face-to-face assessment. CSW left messages for return calls. Selina at [facility] has been very busy today with the PA, however, she would inform her that CSW called.  Genice Rouge [id number] ED CSW 03/12/2016 12:00 PM

## 2016-03-12 NOTE — ED Notes (Signed)
Pt is getting increasingly upset and agitated, requesting to leave. She is standing at the door, refusing to go back to room. She is insisting that we talk to her parents and tell them when is she going to be discharged.

## 2016-03-12 NOTE — ED Notes (Signed)
Pt's father visiting with patient.

## 2016-03-12 NOTE — ED Notes (Signed)
Pt is upset and angry, after her parents' visited, She is crying loudly, saying she wants to leave now and that we are not helping her at all. She said "daddy told me I'll be leaving today, my case worker is coming to get me".

## 2016-03-12 NOTE — Progress Notes (Signed)
CSW reached out to [facility] [phone] [facility] facility in [place], [place] has declined patient.  Guardian states that the patient does not have any psychological testing. She informed CSW that the patient has an appointment for testing with Dr.Larter on April 13th. She states that the appointment is at [facility] [place] in [place], [place].  [place] made guardian aware that patient is psychiatrically and medically clear. CSW informed Guardian that her assistance will be needed in order to place patient and to find an appropriate facility. Guardian expressed understanding.   Willette Brace [id number] ED CSW 03/12/2016 6:01 PM

## 2016-03-12 NOTE — ED Notes (Signed)
Pt on the phone with father. Rules made clear with patient. She states she understands.

## 2016-03-12 NOTE — NC FL2 (Signed)
[place] LEVEL OF CARE SCREENING TOOL     IDENTIFICATION  Patient Name: Jamie Ross Birthdate: Jun 05, 1986 Sex: female Admission Date (Current Location): 03/05/2016  [place] and [place] Number:  Marina Gravel  ([id number]) Facility and Address:  [facility],  [place] [address], [place]      Provider Number: [id number]  Attending Physician Name and Address:  Provider Default, MD  Relative Name and Phone Number:       Current Level of Care: Hospital Recommended Level of Care: [place] Prior Approval Number:    Date Approved/Denied:   PASRR Number:  (PASRR number )  Discharge Plan: Other (Comment) (ALF)    Current Diagnoses: Patient Active Problem List   Diagnosis Date Noted  . Mood disorder ([place]) 03/06/2016  . Auditory hallucinations   . Self-harm   . Piriformis syndrome 04/23/2015  . Fibromyalgia 04/23/2015  . 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 Cough 03/07/2014  . Chronic pain syndrome 02/07/2014  . Insomnia 02/07/2014  . Dermatitis 02/07/2014  . Atypical nevi 07/20/2013  . Acne 07/20/2013  . Hypokalemia 03/22/2013  . Depression 03/22/2013  . Chronic nausea 02/22/2013  . Chronic headaches 01/12/2013  . GERD (gastroesophageal reflux disease) 11/05/2012  . Irregular menses 11/05/2012  . Obesity 04/20/2012  . Edema 04/20/2012  . Seasonal allergies 04/20/2012  . Neuropathy ([place]) 02/12/2012  . Inverted nipple 02/12/2012  . Tachycardia 02/12/2012  . Development delay 01/27/2012    Orientation RESPIRATION BLADDER Height & Weight     Self, Time, Place  Normal  (Per Nurse) Weight:   Height:     BEHAVIORAL SYMPTOMS/MOOD NEUROLOGICAL BOWEL NUTRITION STATUS     (None.)  (Per Nurse)   AMBULATORY STATUS COMMUNICATION OF NEEDS Skin    Verbally Normal                       Personal Care Assistance Level of Assistance          Functional Limitations Info           SPECIAL CARE FACTORS  FREQUENCY                       Contractures Contractures Info: Not present    Additional Factors Info  Code Status, Allergies (Full Code) Code Status Info: Full Code Allergies Info: Doxycycline, Penicillins, Risperidone, and Related, Septra, Depo-Provera           Current Medications (03/12/2016):  This is the current hospital active medication list Current Facility-Administered Medications  Medication Dose Route Frequency Provider Last Rate Last Dose  . acetaminophen (TYLENOL) tablet 650 mg  650 mg Oral Q4H PRN Margarita Mail, PA-C   650 mg at 03/11/16 1626  . albuterol (PROVENTIL HFA;VENTOLIN HFA) 108 (90 Base) MCG/ACT inhaler 2 puff  2 puff Inhalation Q4H PRN Margarita Mail, PA-C   2 puff at 03/06/16 0850  . albuterol (PROVENTIL) (2.5 MG/3ML) 0.083% nebulizer solution 2.5 mg  2.5 mg Nebulization Q6H PRN Margarita Mail, PA-C      . ALIGN CAPS 4 mg  4 mg Oral Daily Margarita Mail, PA-C   4 mg at 03/12/16 1016  . alum & mag hydroxide-simeth (MAALOX/MYLANTA) 200-200-20 MG/5ML suspension 30 mL  30 mL Oral PRN Margarita Mail, PA-C      . ARIPiprazole (ABILIFY) tablet 10 mg  10 mg Oral Daily Delfin Gant, NP   10 mg at 03/12/16 1016  . escitalopram (LEXAPRO) tablet 20 mg  20 mg Oral Daily Margarita Mail, PA-C   20 mg at 03/12/16 1016  . estradiol (ESTRACE) tablet 2 mg  2 mg Oral Daily Margarita Mail, PA-C   2 mg at 03/12/16 1016  . gabapentin (NEURONTIN) capsule 400 mg  400 mg Oral TID Margarita Mail, PA-C   400 mg at 03/12/16 1016  . hydrOXYzine (ATARAX/VISTARIL) tablet 50 mg  50 mg Oral QHS Margarita Mail, PA-C   50 mg at 03/11/16 2142  . ibuprofen (ADVIL,MOTRIN) tablet 600 mg  600 mg Oral Q8H PRN Margarita Mail, PA-C   600 mg at 03/08/16 0243  . ipratropium (ATROVENT HFA) inhaler 2 puff  2 puff Inhalation TID Margarita Mail, PA-C   2 puff at 03/12/16 1016  . ipratropium (ATROVENT) nebulizer solution 0.5 mg  0.5 mg Nebulization Q6H PRN Margarita Mail, PA-C      . loperamide  (IMODIUM) capsule 2 mg  2 mg Oral QID PRN Margarita Mail, PA-C      . mometasone-formoterol (DULERA) 200-5 MCG/ACT inhaler 2 puff  2 puff Inhalation BID Margarita Mail, PA-C   2 puff at 03/12/16 1016  . montelukast (SINGULAIR) tablet 10 mg  10 mg Oral QHS Margarita Mail, PA-C   10 mg at 03/11/16 2143  . ondansetron (ZOFRAN) tablet 4 mg  4 mg Oral Q8H PRN Margarita Mail, PA-C      . pantoprazole (PROTONIX) EC tablet 40 mg  40 mg Oral Daily Margarita Mail, PA-C   40 mg at 03/12/16 1017  . polyethylene glycol (MIRALAX / GLYCOLAX) packet 17 g  17 g Oral BID Deno Etienne, DO   17 g at 03/11/16 2142  . topiramate (TOPAMAX) tablet 50 mg  50 mg Oral BID Margarita Mail, PA-C   50 mg at 03/12/16 1016  . zolpidem (AMBIEN) tablet 5 mg  5 mg Oral QHS PRN Margarita Mail, PA-C   5 mg at 03/08/16 2128   Current Outpatient Prescriptions  Medication Sig Dispense Refill  . acetaminophen (TYLENOL) 650 MG CR tablet Take 650 mg by mouth every 8 (eight) hours as needed for pain.    Marland Kitchen albuterol (PROAIR HFA) 108 (90 BASE) MCG/ACT inhaler Inhale 2 puffs into the lungs every 6 (six) hours as needed. For wheezing.    . ARIPiprazole (ABILIFY) 5 MG tablet Take 2.5 mg by mouth daily.     . ATROVENT HFA 17 MCG/ACT inhaler Take 2 puffs by mouth 3 (three) times daily.     . Brexpiprazole (REXULTI) 1 MG TABS Take 1 mg by mouth every morning.    . budesonide-formoterol (SYMBICORT) 160-4.5 MCG/ACT inhaler Inhale 2 puffs into the lungs 2 (two) times daily. 1 Inhaler 0  . escitalopram (LEXAPRO) 20 MG tablet Take 1 tablet (20 mg total) by mouth daily. 30 tablet 0  . estradiol (ESTRACE) 2 MG tablet Take 2 mg by mouth daily.    Marland Kitchen gabapentin (NEURONTIN) 400 MG capsule Take 1 capsule (400 mg total) by mouth 3 (three) times daily. 90 capsule 0  . hydrOXYzine (ATARAX/VISTARIL) 50 MG tablet Take 50 mg by mouth at bedtime.    . montelukast (SINGULAIR) 10 MG tablet Take 1 tablet (10 mg total) by mouth at bedtime. 30 tablet 0  . OXYGEN Place 2 L  into the nose at bedtime.    . pantoprazole (PROTONIX) 40 MG tablet Take 1 tablet (40 mg total) by mouth daily. 30 tablet 0  . polyethylene glycol powder (GLYCOLAX/MIRALAX) powder Take 17 g by mouth 2 (two) times daily.    Marland Kitchen  Probiotic Product (ALIGN) 4 MG CAPS Take 4 mg by mouth daily.    Marland Kitchen topiramate (TOPAMAX) 25 MG tablet Take 50 mg by mouth 2 (two) times daily.    . [DISCONTINUED] hydrochlorothiazide (MICROZIDE) 12.5 MG capsule Take 12.5 mg by mouth daily.    . [DISCONTINUED] potassium chloride SA (K-DUR,KLOR-CON) 20 MEQ tablet Take 20 mEq by mouth daily.       Discharge Medications: Please see discharge summary for a list of discharge medications.  Relevant Imaging Results:  Relevant Lab Results:   Additional Information    Guadelupe Sabin, LCSW

## 2016-03-12 NOTE — ED Notes (Signed)
Pt spoke to mom and dad, sts they are coming to visit. Pt is calm and excited about parents' visit

## 2016-03-12 NOTE — Progress Notes (Signed)
CSW reached out to [facility] in order to try and obtain placement for patient. The following are listed below:  [facility]  CSW spoke with staff, and 7 of the facilities do not have any open female beds. The 2 facilities listed below have openings and the owners state that they will come to meet with the patient's tomorrow.    [facility] spoke with owner/ Dellie Catholic [phone] who states that she has a female bed opening. CSW arranged a meeting for owner to meet with patient tomorrow to determine if pt will be an appropriate fit for her facility. She states that she will come to [facility] tomorrow at 4:00pm.  [facility] spoke with Charlann Noss [phone] who states that she has a female bed opening. CSW arranged a meeting for owner to meet with patient tomorrow to determine if pt will be an appropriate fir for her facility. She states she will come to [facility] tomorrow at 1:00pm.  Willette Brace [id number] ED CSW 03/12/2016 9:34 PM

## 2016-03-12 NOTE — Progress Notes (Signed)
CSW received a telephone call back from Carney Bern, Scientist, physiological with [facility], [place], [place]. She stated she reviewed the paperwork sent and patient would not be appropriate for their facility. She stated they are not a locked down facility and she feels patient would be "more than they could handle". CSW staffed this information with Asst. Social Work Mudlogger who states to call [name] Guardian to speak with them regarding placement.  CSW attempted a telephone call to [name], [place]. CSW left a message with name and number for return call.   Genice Rouge [id number] ED CSW 03/12/2016 2:39 PM

## 2016-03-12 NOTE — ED Notes (Signed)
Pt is getting even more agitated, banging her fist on of the door frame, Dr Winfred Leeds notified and verbal order for ativan received.

## 2016-03-13 DIAGNOSIS — R456 Violent behavior: Secondary | ICD-10-CM | POA: Diagnosis not present

## 2016-03-13 NOTE — Progress Notes (Signed)
CSW called Jamison Neighbor with [facility], [phone] to see if she was still planning to visit patient on today for an assessment. CSW left message with name and contact number for a return call.  CSW received a call back from [facility]. She stated she has a medical emergency and that was the reason she was unable to see patient today at 1:00pm. She stated she has an appointment in the morning and she did not know if she would be able to come and see patient on tomorrow. She stated she has a weekend employee that may be able to come and assess patient on tomorrow. She stated she would call her employee today and speak with her regarding seeing patient.  Genice Rouge [id number] ED CSW 03/13/2016 2:53 PM

## 2016-03-13 NOTE — Progress Notes (Signed)
   03/13/16 1100  Clinical Encounter Type  Visited With Patient  Visit Type Initial;Psychological support;Spiritual support;ED;Behavioral Health  Referral From Nurse  Consult/Referral To Chaplain  Spiritual Encounters  Spiritual Needs Emotional;Other (Comment) (Pastoral Conversation/Support)  Stress Factors  Patient Stress Factors Other (Comment) (Being in the Hospital)   I visited with the patient per nurse referral. The patient had been tearful and agitated due to wanting to be discharged.  The nursing staff were able to calm the patient down compassionately.  When I entered the room, the patient was sitting on her bed. She introduced me to her doll that was placed beside her on the bed. She talked about her dolls and about what she likes to watch on television.    [place] M.Div.

## 2016-03-13 NOTE — BH Assessment (Deleted)
[place] Assessment Progress Note  Per Corena Pilgrim, MD, this pt continues to require psychiatric hospitalization.  At 08:48 Shirlee Limerick calls from [facility].  Pt has been accepted to their facility by Geanie Kenning, MD.  A bed will be available later today, and she will call when they are ready for pt to be transferred.  Dr Darleene Cleaver concurs with this decision.  When the time comes report is to be called to [phone].  Pt is under IVC and is to be transported via [facility].  Jalene Mullet, [place] Triage Specialist [phone]

## 2016-03-13 NOTE — Progress Notes (Signed)
Jamie Ross/Owner of [facility] met with CSW and patient to conduct interview. Owner informed patient about group home. Also, she allowed patient to tell her about herself. Patient was cooperative, and appropriate during the interview.  CSW also met with owner alone in [facility] conference room. She states that she will review patient, and think about it before she makes a final decision on whether to accept patient. Owner expressed that she feels patient is pleasant. However, she states that she does have some concerns regarding some of the patient's diagnosis such as hypokalemia, Insomnia, and Fibromyalgia. CSW spoke with NP regarding these concerns. CSW called owner back to inform her of conversation with NP. However, she did not answer the phone. CSW will continue to follow up.  CSW provided supportive counseling to patient. CSW provided encouragement to patient.  Jamie Ross/ [phone] Fax [phone]

## 2016-03-13 NOTE — Progress Notes (Addendum)
CSW called [facility], spoke with [place], [phone]. She stated CSW could fax FL2 for review. CSW faxed FL2, fax# [phone]. CSW provided contact information for a return call regarding patient.  Genice Rouge [id number] ED CSW 03/13/2016 9:54 AM

## 2016-03-13 NOTE — ED Notes (Addendum)
Pt is very tearful this am stating ,"I want to leave. How much longer do I have to stay." Pt showed the nurse her doll baby that her parents brought her yesterday/ She does contract for safety.- Pt is in the BR crying saying,"I have to get out of here. I need to see the case worker." Phoned case worker to come and see pt. (9am )Pt has spoken to her dad times three today. Presently she is calmer after being told if she behaves she can have a hotdog with french fries for lunch. (11am )Pt is in her room watching cartoons. 1:30pm Pt stated, "I need to be on a sleep apnea machine." Phoned pts dad. (1:30pm)Report to oncoming shift (3:20pm )

## 2016-03-13 NOTE — ED Notes (Signed)
Patient was in her bathroom banging her head against the wall stating that she wants to leave and go home and everyone has been lying to her. I was able to redirect her and have her sit to eat lunch.

## 2016-03-13 NOTE — ED Notes (Signed)
Patient lying in bed, respirations even and unlabored, skin warm and dry, eyes closed, denies further needs, in NAD.

## 2016-03-14 DIAGNOSIS — R456 Violent behavior: Secondary | ICD-10-CM | POA: Diagnosis not present

## 2016-03-14 NOTE — ED Notes (Signed)
Per request of RN EDP Jacubowitz will allow patient to go out side supervised with NT and Security staff.

## 2016-03-14 NOTE — Progress Notes (Signed)
CSW spoke with Dellie Catholic, [facility], [phone]. CSW provided her with information given on yesterday from NP regarding patient's diagnosis and the number of times patient has been in the ED this year. She stated after meeting patient yesterday, she felt she was a nice person, however, she would not be able to accept patient at her facility because of her history.   Genice Rouge [id number] ED CSW 03/14/2016 9:26 AM

## 2016-03-14 NOTE — ED Notes (Addendum)
Pt's adoptive parents are here to visit; per TTS and Charge nurse, they are not allowed to see the patient without express permission from [place] which now holds legal guardianship of the pt.  This RN informed registration of this determination and recommended that the visitors reach out to [place] for clarification if there are questions.

## 2016-03-14 NOTE — Progress Notes (Signed)
   03/14/16 1500  Clinical Encounter Type  Visited With Patient;Health care provider  Visit Type Follow-up;Psychological support;Spiritual support;ED;Behavioral Health  Referral From Nurse  Consult/Referral To Chaplain  Spiritual Encounters  Spiritual Needs Emotional;Other (Comment) (Pastoral Conversation/Support)  Stress Factors  Patient Stress Factors None identified   I followed up with the patient who was sitting on her bed holding her stuffed animal.  The nurse was trying to get approval to take the patient out for a walk with the sitter and a security guard. The patient was in a good mood today and seemed happy to be going for a walk.   Chaplain will continue to visit with the patient for support and care.   [place] M.Div.

## 2016-03-14 NOTE — ED Notes (Signed)
Call from [facility] again requesting that no information is relayed to pt's adoptive parents since [name] is her legal guardian.

## 2016-03-14 NOTE — ED Notes (Signed)
Pt is calm and cooperative and denies any needs at this time.  Safety sitter within sight continues to maintain pt safety.

## 2016-03-14 NOTE — Progress Notes (Addendum)
CSW spoke with Wells Guiles at [facility], [phone] to complete a referral. She stated CSW could send the [facility] for review. She provided the fax# [phone]. CSW faxed the [facility] for review. She stated they would call back once they review the FL2.  CSW called back to see if [place] reviewed the FL2. CSW spoke with Crystal, Director of the facility and she stated they would not be able to accept patient at their facility.  CSW informed [name], Legal Guardian that patient was not accepted at [facility].   CSW staffed with EDP and NP and patient will not be IVC.  Genice Rouge [id number] ED CSW  03/14/2016 1:55 PM

## 2016-03-14 NOTE — ED Notes (Signed)
Pt was notified by CSW that she would not be d/c today to a group home. Pt began to exhibit behaviors consistent with the news that she received. Crying, yelling and screaming "I am never going to leave this this place, I want to go home, leave me alone." Security presented to the bedside while myself the RN and staff attempted to calm patient down. Pt did later settle down and was calm and cooperative during lunch time. Later was able to speak on the phone with her parents who said they would visit her sometime today.
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
## 2016-03-14 NOTE — ED Notes (Signed)
Pt has been allowed one additional 5-minute phone call; RN explained that she will be unable to use the phone again after this call and pt verbalized understanding.  She is currently happy and smiling and does not appear to be in distress at this time.

## 2016-03-14 NOTE — ED Notes (Signed)
Pt is sleeping comfortably with equal chest rise and fall noted.  Safety sitter is within sight to maintain pt safety.

## 2016-03-14 NOTE — ED Notes (Signed)
Pt is resting comfortably in bed after eating her meal.  Equal chest rise and fall noted with no signs of acute distress noted.  Safety sitter within sight to maintain pt safety.

## 2016-03-14 NOTE — ED Notes (Signed)
Pt is resting comfortably with equal chest rise and fall noted.  Safety sitter within sight to maintain pt safety.  No acute distress noted.

## 2016-03-15 DIAGNOSIS — R456 Violent behavior: Secondary | ICD-10-CM | POA: Diagnosis not present

## 2016-03-15 LAB — URINALYSIS, ROUTINE W REFLEX MICROSCOPIC
BILIRUBIN URINE: NEGATIVE
GLUCOSE, UA: NEGATIVE mg/dL
Hgb urine dipstick: NEGATIVE
KETONES UR: NEGATIVE mg/dL
Nitrite: NEGATIVE
PROTEIN: NEGATIVE mg/dL
Specific Gravity, Urine: 1.026 (ref 1.005–1.030)
pH: 6 (ref 5.0–8.0)

## 2016-03-15 LAB — URINE MICROSCOPIC-ADD ON: RBC / HPF: NONE SEEN RBC/hpf (ref 0–5)

## 2016-03-15 NOTE — ED Notes (Signed)
SW at bedside.

## 2016-03-15 NOTE — ED Notes (Signed)
Patient sleeping, breathing even and unlabored.  NAD at this time. 

## 2016-03-15 NOTE — ED Notes (Signed)
Patient alert and oriented, watching TV.  NAD at this time

## 2016-03-15 NOTE — ED Notes (Signed)
Resting quietly with eye closed. Easily arousable. Verbally responsive. Resp even and unlabored. ABC's intact. No behavior problems noted. NAD noted. Sitter at bedside.  

## 2016-03-15 NOTE — Progress Notes (Addendum)
Pt is very calm and cooperative. She stated her low back hurts and she has burning with urination,. PA made aware and a urine was obtained and sent. Urine is straw colored w/o sediment. Pt remains with a Associate Professor for safety. (7:30pm )Pt was given two cups of cranberry juice and encouraged to push fluids. Urine results are back. Phoned EDP. 8:50pm-Report to oncoming shift. (11pm )

## 2016-03-15 NOTE — ED Notes (Signed)
Pt noted crying loudly without visible tears. Pt reported that she wanted to speak with [name] and informed that the [name] worker is off because of the weekend and she can speak with them on Monday. Pt offered to speak with SW. SW notified. Pt offered Atarax but she refused stating, "I will not take any medicine until I speak with the social worker".

## 2016-03-15 NOTE — Clinical Social Work Note (Signed)
CSW called [facility] ( [phone]) to obtain a new home for pt.  Per Adonis Brook at [facility] they do have a bed but stated it is a long process and they are a part of the [place]. They provided fax number and FL2 was faxed. Difficulty going through and facility called stating they are having fax problems.   CSW called and spoke with Ms Rosana Hoes [phone] of [facility] home to provide information for pt bed.  Ms Rosana Hoes stated that they did have a bed and when discussing pt she hought pt might benefit from her transitional home.  Ms Rosana Hoes agreed that she would come out to hospital to meet with pt either today or tomorrow and assess for placement. FL2 faxed to [facility] at [phone]   .Dede Query, LCSW [facility] Clinical Social Worker - Weekend Coverage cell #: [phone]

## 2016-03-15 NOTE — ED Notes (Signed)
Awake. Verbally responsive. Resp even and unlabored. ABC's intact. No behavior problems noted. Pt interacting with staff. Pt denies SI/HI and AVH.  NAD noted. Sitter at bedside.

## 2016-03-15 NOTE — ED Notes (Signed)
Awake. Verbally responsive. Resp even and unlabored. ABC's intact. No behavior problems noted. Pt interacting with staff. NAD noted. Sitter at bedside.

## 2016-03-15 NOTE — ED Notes (Signed)
Pt is sleeping comfortably with no signs of acute distress noted.  Safety sitter within sight.

## 2016-03-15 NOTE — Clinical Social Work Note (Signed)
CSW received handoff reflecting a need to follow up in [place] MUST for pt pasaar.  CSW went into system and pt has been provided with new pasaar number [id number].  It was added to pt's FL2.  Per chart pt has a legal guardian through [place] and is working on placement for pt.  A review of CSW handoff reflected that pt's legal guardian may have a new home for pt on Monday and asked to have the week day CSW follow up with pt's legal guardian on Monday.  Dede Query, LCSW [place] Worker - Weekend Coverage cell #: [phone]

## 2016-03-15 NOTE — ED Notes (Signed)
Patient out of bed walking into hallway asking to speak to the case worker, does not understand why it is taking so long and why she is still here.  This RN will attempt to contact case worker.  Asked patient to stay in assigned room while attempts are made by this RN.

## 2016-03-15 NOTE — ED Notes (Signed)
Patient currently sitting on bedside, watching TV.  Patient NAD at this time.

## 2016-03-15 NOTE — ED Notes (Signed)
Patient became agitated wanting to leave.  Patient stated "I'm not staying here" threatening to leave.  Security called to assist.

## 2016-03-15 NOTE — ED Notes (Signed)
Pt standing in doorway and crying stating that she wants to go home. Pt reminded of conversation from SW is working on placement for Monday. Pt also made aware of [place] representative plans to visit today or tomorrow.  Pt was able to calm self down.

## 2016-03-15 NOTE — ED Notes (Signed)
Patient currently sitting on bed speaking to officer.  Patient currently calm and cooperative.

## 2016-03-16 DIAGNOSIS — R456 Violent behavior: Secondary | ICD-10-CM | POA: Diagnosis not present

## 2016-03-16 MED ORDER — MENTHOL 3 MG MT LOZG
1.0000 | LOZENGE | OROMUCOSAL | Status: DC | PRN
  Administered 2016-03-16 (×2): 3 mg via ORAL
  Filled 2016-03-16: qty 9

## 2016-03-16 NOTE — ED Notes (Signed)
Jamie Ross from [facility] at bedside to interview the patient.
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
## 2016-03-16 NOTE — ED Notes (Signed)
Informed by respiratory therapist that pt c/o of a sore throat and has wheezing in her throat area.  Recommened that pt get some throat spray or cough drops.  Dr. Rex Kras made aware.

## 2016-03-17 DIAGNOSIS — R456 Violent behavior: Secondary | ICD-10-CM | POA: Diagnosis not present

## 2016-03-17 LAB — URINE CULTURE

## 2016-03-17 NOTE — Progress Notes (Signed)
Pt called adoptive father Jamie Ross [phone].  Pt informed this nurse that father would like to speak with me.  When I answered phone, father requested a visit with patient.  I informed adoptive father that I would need his name and number in order to contact Guardian regarding their visitation.  I spoke with patient's guardian at length, Jamie Ross [phone].  Adoptive parents, Jamie Ross and Jamie Ross are not allowed to visit without approval from [place].  Jamie Ross will call this nurse in TCU with information regarding approval or denial of visitation with Jamie Ross today.  Advised patient of this information.  Patient became emotional and tearful at nurses' station.  Patient was asked to return to her room and to try to be patient as everyone is working diligently to find the appropriate placement for her.  Encouraged patient to keep herself busy, either watch television or care for her "baby".  Pt continues to be tearful, placing her face against the glasses that faces the nurses station.  Sitter initiates conversation with patient which seems to calm her and distract her from situation of visitation and placement.

## 2016-03-17 NOTE — Progress Notes (Signed)
   03/17/16 1000  Clinical Encounter Type  Visited With Patient;Health care provider  Visit Type Follow-up;Spiritual support;Psychological support;ED;Behavioral Health  Referral From Nurse  Consult/Referral To Chaplain  Spiritual Encounters  Spiritual Needs Emotional;Other (Comment) (Pastoral Conversation/Support)  Stress Factors  Patient Stress Factors Major life changes;Other (Comment) (Being discharged)   I followed up with the patient today who was standing at the nurses station very tearful. Jamie Ross was asking about her discharge plans and wanting to leave the hospital. She requested to see her social worker. The patient has been stating that she doesn't want to be at the hospital since the previous week.  The nurse asked if I could get in contact with the social worker and see if she could come and speak with the patient.  I sent an email to the social worker who had previously worked with the patient to find out what information I could. At this time, I am awaiting a response.  Will continue to follow-up for spiritual support and care.    [place] M.Div.

## 2016-03-17 NOTE — ED Notes (Signed)
CSW, Lindajo Royal, in to speak with patient.

## 2016-03-17 NOTE — Progress Notes (Signed)
CSW received voicemail from Kendra Opitz from [facility] stating she would not be able to accept patient at her facility. CSW informed patient of this information as well as nurse.   CSW received information from [name], [place] for possible placement. CSW spoke with Danice Goltz with [facility] [phone]. She stated CSW could fax the [facility] for review and fax# is [phone].   CSW received telephone call from [facility] who states she will come to see patient on Wednesday, April 12th around 11:30/12:00 to see if she would be appropriate for her facility. She stated she would meet with patient and CSW.   CSW called: [facility]- only accept clients 50+  [facility]- only accept elderly  [facility]- no answer at the time.  Genice Rouge [id number] ED CSW 03/17/2016 4:29 PM

## 2016-03-17 NOTE — ED Notes (Signed)
10 am medications delayed as patient initially refused then decided to take them later.

## 2016-03-17 NOTE — ED Notes (Addendum)
Per Lourena Simmonds [phone], adoptive parents Louie Casa and Lovely Gavilanes have approval for visitation three (3) times per week for 30 minute maximum each time.  Patient is to have a sitter and should supervise these visits.  Adoptive father, Louie Casa, made aware of the visitation rules and limitations.  He would like visitation to take place on Wednesdays, Fridays and Sundays. Wednesday and Friday, they will probably come between 10a-11a and on Sunday they will probably arrive between 1:30/2pm.  If these days change, the adoptive parents will notify [facility] nursing staff.

## 2016-03-18 DIAGNOSIS — R456 Violent behavior: Secondary | ICD-10-CM | POA: Diagnosis not present

## 2016-03-18 LAB — URINALYSIS, ROUTINE W REFLEX MICROSCOPIC
BILIRUBIN URINE: NEGATIVE
Glucose, UA: NEGATIVE mg/dL
HGB URINE DIPSTICK: NEGATIVE
KETONES UR: NEGATIVE mg/dL
NITRITE: NEGATIVE
PH: 8 (ref 5.0–8.0)
Protein, ur: NEGATIVE mg/dL
Specific Gravity, Urine: 1.019 (ref 1.005–1.030)

## 2016-03-18 LAB — URINE MICROSCOPIC-ADD ON

## 2016-03-18 MED ORDER — FOSFOMYCIN TROMETHAMINE 3 G PO PACK
3.0000 g | PACK | Freq: Once | ORAL | Status: AC
  Administered 2016-03-18: 3 g via ORAL
  Filled 2016-03-18: qty 3

## 2016-03-18 MED ORDER — ALBUTEROL SULFATE (2.5 MG/3ML) 0.083% IN NEBU
2.5000 mg | INHALATION_SOLUTION | Freq: Four times a day (QID) | RESPIRATORY_TRACT | Status: DC | PRN
  Administered 2016-03-21 – 2016-03-23 (×2): 2.5 mg via RESPIRATORY_TRACT
  Filled 2016-03-18 (×2): qty 3

## 2016-03-18 NOTE — ED Notes (Signed)
Patient noted in room. No complaints, stable, in no acute distress. 1:1 observation continued. Patient remains safe.

## 2016-03-18 NOTE — Progress Notes (Signed)
CSW met with patient at bedside. CSW provided encouragement for patient. CSW provided coloring worksheets for patient. Patient accepted.  Willette Brace [phone] ED CSW 03/18/2016 10:56 PM

## 2016-03-18 NOTE — ED Notes (Addendum)
Pt reports pain upon urination.  Pt also reports back pain. Dr. Billy Fischer made aware.
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
## 2016-03-18 NOTE — ED Notes (Signed)
Dr. Johnney Killian made aware of pt's headache that has not improved with pain medication.

## 2016-03-18 NOTE — ED Notes (Signed)
Pt currently taking a shower.

## 2016-03-18 NOTE — Progress Notes (Signed)
CSW completed PASRR for patient. PASRR number is under review.  Willette Brace [id number] ED CSW 03/18/2016 10:31 PM.

## 2016-03-18 NOTE — ED Notes (Addendum)
Pt's parents visiting for 30 minutes today.  Father states he will not come on Friday since he is visiting today.
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
## 2016-03-18 NOTE — ED Provider Notes (Signed)
Received call regarding patient reporting some dysuria and went to evaluate her. Urinalysis showed 0-5 leukocytes, however many bacteria with only 0-5 squamous cells. Culture showed multiple species.  Given patient's symptoms and likely contaminated urine, will recollect sample and treat with one time dose of fosfomycin for uncomplicated UTI. Has chronic back pain which is unchanged, no CVA tenderness and doubt pyelonephritis or complicated UTI. Denies vaginal discharge/concern for STIs.  Patient also reports she uses a nebulizer at home for her asthma. I do not see this listed for her but given she reports this, will order home nebulizer treatments to use prn.  She has scattered wheezing on exam, however is in no acute respiratory distress and is appropriate for continued outpatient management of her asthma and do not feel symptoms are consistent with exacerbation.   Gareth Morgan, MD 03/18/16 [phone]

## 2016-03-18 NOTE — ED Notes (Signed)
Pt reports having a headache.

## 2016-03-19 DIAGNOSIS — R456 Violent behavior: Secondary | ICD-10-CM | POA: Diagnosis not present
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
## 2016-03-19 NOTE — Progress Notes (Signed)
CSW reached out to the following family care homes in order to try and obtain placement:  [facility] - No [facility] - No [facility] - No [facility] spoke with patricia who states to call back tomorrow at [phone], and speak with Gilberto Better" regarding possible open beds.   CSW will continue to follow up.  Willette Brace [id number] ED CSW 03/19/2016 10:22 PM
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
## 2016-03-19 NOTE — Progress Notes (Signed)
Jamie Ross/Guardian of patient reached out to [place] inquiring about patient's interview with a family care home. CSW informed guardian that the interview went well, but that a decision has not been made regarding patient and that she has not been accepted into any facility as of yet.  CSW informed guardian that her assistance with placing the patient will be needed. CSW asked guardian if she has any leads on prospective group homes for patient. Guardian stated " We haven't come up with anything else, so Im hoping that this will work out".  Willette Brace [id number] ED CSW 03/19/2016 10:28 PM

## 2016-03-20 DIAGNOSIS — R456 Violent behavior: Secondary | ICD-10-CM | POA: Diagnosis not present

## 2016-03-20 NOTE — Progress Notes (Addendum)
Guardian reached out to CSW. She states that [facility] has accepted the patient. However, she states they will not be welcomed to come until Monday. Also, she informed CSW that she will provide transportation for patient and come to pick the patient up from [facility] Monday.  Guardian state that the group home is located in [place], [place].  Patient is not aware that she has been accepted at a new facility. PLEASE DO NOT INFORM patient of this information as of yet to avoid confusion in case there are any changes.  Willette Brace [id number] ED CSW 03/20/2016 9:42 PM

## 2016-03-20 NOTE — ED Notes (Addendum)
Dr. Dayna Barker informed of pt c/o c/p & inspiratory wheezes.  See orders.

## 2016-03-20 NOTE — ED Notes (Signed)
Pt states "I slept horrible because of the chest pain." This nurse confirms with sitter that both the writer and sitter observed pt sleeping soundly through the night from approximately 2300 to 0600.

## 2016-03-20 NOTE — ED Notes (Signed)
Received call from Doyle Askew, Caseworker with [facility].  She stated the parents nor others are to be calling & obtaining information for the patient d/t the family members have been calling & saying they were the caseworker.  She also stated the password was [email or web address].

## 2016-03-20 NOTE — Progress Notes (Addendum)
Pt is pleasant and cooperative this am and hopeful she will leave soon. Report to the oncoming shift(7:05pm)

## 2016-03-20 NOTE — Progress Notes (Signed)
CSW followed up on a call from 2nd shift CSW note. CSW spoke with Jamie Ross at [facility] [phone]. She stated she did not have any availability at the time. She provided CSW with a name and contact for another facility, Jamie Ross with [facility].  CSW called and spoke with Jamie Ross with [facility], [place], [place] [phone]. She stated she will come tomorrow, Friday, April 14th around 10:00/10:30am to assess patient.   Genice Rouge [id number] ED CSW 03/20/2016 10:03 AM

## 2016-03-20 NOTE — ED Notes (Addendum)
Patient ran out of the unit. [employer] officer followed after along with lead tech. Patient stopped by room 25 in Emergency room. Unit tech lead patient back into her room. Notified RN.

## 2016-03-20 NOTE — Progress Notes (Signed)
   03/20/16 1500  Clinical Encounter Type  Visited With Patient  Visit Type Follow-up  Referral From Nurse  Consult/Referral To Chaplain  Spiritual Encounters  Spiritual Needs Emotional;Other (Comment) (Pastoral Conversation/Support)  Stress Factors  Patient Stress Factors Other (Comment)   I am continuing to follow up with this patient. She states that she still wants to be placed somewhere and that she is tired of being in the hospital. She is remaining calm, but is visibly depressed and sad.  Myself and staff have been trying to keep her spirits up and reassure her.  Will continue to follow her.   [place] M.Div.

## 2016-03-21 DIAGNOSIS — R456 Violent behavior: Secondary | ICD-10-CM | POA: Diagnosis not present

## 2016-03-21 NOTE — Progress Notes (Addendum)
CSW left a message for Bethann Berkshire with [facility], [place], [place] [phone] to inform her that she did not need to come to the hospital for her assessment with patient, due to patient being accepted at another facility, per 2nd shift CSW note. CSW informed her in the message that patient had been accepted at another facility. CSW asked for a return call regarding this message with name and contact number left on voicemail.  CSW spoke with Bethann Berkshire with [facility] and she states she received the message.  Genice Rouge [id number] ED CSW 03/21/2016 8:14 AM

## 2016-03-21 NOTE — Progress Notes (Signed)
   03/21/16 1500  Clinical Encounter Type  Visited With Patient  Visit Type Follow-up  Referral From Patient  Consult/Referral To Chaplain  Spiritual Encounters  Spiritual Needs Emotional;Other (Comment) (Pastoral Conversation/Support)  Stress Factors  Patient Stress Factors Other (Comment);Exhausted   I visited with the patient to check up on her after hearing from th Social Worker that the patient had a rough day after learning that she was not going to be placed and leave the hospital today.  The patient was in a good mood when I arrived and talking to a security guard. The patient was watching television.  The patient requested to listen to the radio and the nurse was notified.    Please contact Spiritual Care for further assistance.   [place] M.Div.

## 2016-03-21 NOTE — Progress Notes (Signed)
CSW called and spoke with Danice Goltz, [facility] [phone]. She stated they would not be able to accept patient until Monday, April 17th. She stated the Legal Guardian would pick patient up from the hospital. She stated she did not want patient to know of the acceptance. She stated she would need a current FL2 signed by the physician and prescriptions for all medications when patient is picked up on Monday. Fax# [phone] if needed.  Patient is NOT to be informed that she has been accepted to avoid confusion should there be any changes.   Genice Rouge [id number] ED CSW 03/21/2016 3:23 PM

## 2016-03-21 NOTE — ED Notes (Signed)
Threatening to run if she is not allowed to call her parents.  Calmed and reassured.  Security at bedside with sitter.

## 2016-03-21 NOTE — Progress Notes (Addendum)
Pt has multiple somatic complaints this am. She stated, "Ms Jamie Ross I need a sitter this am." Pt appears sad and depressed this am. Pt stated, "I need someone to talk to. " [name] is with the pt. Pt stated she was hungry and wanted a ham sandwich. Pt is very tearful and initially refused to take her meds this am. Informed pt she could have a special lunch but that she must cooperate. Pt did take her meds. (9:30am )10am Pt started top cry and stated, "no one wants me." " I don't want to be here for Easter." Phoned Social work to speak with the pt. Pt is not to know that she has been accepted at a group home but can not leave until Monday. Pt does contract for safety. 11am - Report to the oncoming shift.

## 2016-03-21 NOTE — ED Notes (Signed)
Permission from CN to allow radio.  Given by Surveyor, minerals continued.

## 2016-03-21 NOTE — Progress Notes (Signed)
Per 2nd shift CSW note dated 03-20-16, patient has been accepted at [facility]. They will not be able to pick up patient until Monday. Patient is NOT to be informed that she has been accepted to avoid confusion should there be any changes.   Genice Rouge [id number] ED CSW 03/21/2016 10:21 AM

## 2016-03-22 DIAGNOSIS — R456 Violent behavior: Secondary | ICD-10-CM | POA: Diagnosis not present

## 2016-03-22 NOTE — ED Notes (Signed)
Patient on the phone with godmother.

## 2016-03-23 DIAGNOSIS — R456 Violent behavior: Secondary | ICD-10-CM | POA: Diagnosis not present

## 2016-03-23 NOTE — ED Notes (Signed)
MD at bedside. 

## 2016-03-24 DIAGNOSIS — R456 Violent behavior: Secondary | ICD-10-CM | POA: Diagnosis not present

## 2016-03-24 MED ORDER — ESCITALOPRAM OXALATE 20 MG PO TABS: 20.0000 mg | ORAL_TABLET | Freq: Every day | ORAL | Status: DC

## 2016-03-24 MED ORDER — BREXPIPRAZOLE 1 MG PO TABS: 1.0000 mg | ORAL_TABLET | Freq: Every morning | ORAL | Status: DC

## 2016-03-24 MED ORDER — PANTOPRAZOLE SODIUM 40 MG PO TBEC: 40.0000 mg | DELAYED_RELEASE_TABLET | Freq: Every day | ORAL | Status: DC

## 2016-03-24 MED ORDER — GABAPENTIN 400 MG PO CAPS: 400.0000 mg | ORAL_CAPSULE | Freq: Three times a day (TID) | ORAL | Status: DC

## 2016-03-24 MED ORDER — ALIGN 4 MG PO CAPS: 4.0000 mg | ORAL_CAPSULE | Freq: Every day | ORAL | Status: AC

## 2016-03-24 MED ORDER — ARIPIPRAZOLE 10 MG PO TABS: 10.0000 mg | ORAL_TABLET | Freq: Every day | ORAL | Status: DC

## 2016-03-24 MED ORDER — ZOLPIDEM TARTRATE 5 MG PO TABS: 5.0000 mg | ORAL_TABLET | Freq: Every evening | ORAL | Status: DC | PRN

## 2016-03-24 MED ORDER — ALBUTEROL SULFATE HFA 108 (90 BASE) MCG/ACT IN AERS: 2.0000 | INHALATION_SPRAY | Freq: Four times a day (QID) | RESPIRATORY_TRACT | Status: DC | PRN

## 2016-03-24 MED ORDER — POLYETHYLENE GLYCOL 3350 17 GM/SCOOP PO POWD: 17.0000 g | Freq: Two times a day (BID) | ORAL | Status: DC

## 2016-03-24 MED ORDER — HYDROXYZINE HCL 50 MG PO TABS: 50.0000 mg | ORAL_TABLET | Freq: Every day | ORAL | Status: DC

## 2016-03-24 MED ORDER — TOPIRAMATE 25 MG PO TABS: 50.0000 mg | ORAL_TABLET | Freq: Two times a day (BID) | ORAL | Status: DC

## 2016-03-24 MED ORDER — BUDESONIDE-FORMOTEROL FUMARATE 160-4.5 MCG/ACT IN AERO: 2.0000 | INHALATION_SPRAY | Freq: Two times a day (BID) | RESPIRATORY_TRACT | Status: AC

## 2016-03-24 MED ORDER — ESTRADIOL 2 MG PO TABS: 2.0000 mg | ORAL_TABLET | Freq: Every day | ORAL | Status: DC

## 2016-03-24 MED ORDER — MONTELUKAST SODIUM 10 MG PO TABS: 10.0000 mg | ORAL_TABLET | Freq: Every day | ORAL | Status: DC

## 2016-03-24 MED ORDER — ATROVENT HFA 17 MCG/ACT IN AERS: 2.0000 | INHALATION_SPRAY | Freq: Three times a day (TID) | RESPIRATORY_TRACT | Status: DC

## 2016-03-24 NOTE — Progress Notes (Signed)
CSW left a message for [name], Legal Guardian to see what time she would be arriving to pick patient up on today. Message left with name and contact number for return call.  CSW received a return call from [name] stating she will be here to pick up patient around 9:30am this morning. She stated CSW would need to fax the FL2 and MAR to the facility. Fax# [phone].  Genice Rouge [id number] ED CSW 03/24/2016 8:20 AM

## 2016-03-24 NOTE — Progress Notes (Signed)
   03/24/16 1000  Clinical Encounter Type  Visited With Patient  Visit Type Follow-up;Psychological support;Spiritual support;Behavioral Health;ED  Spiritual Encounters  Spiritual Needs Emotional;Other (Comment) (Pastoral Support)  Stress Factors  Patient Stress Factors Other (Comment) (Finding placement)   I visited with [name] to see how she was feeling today. As I reviewed the patient's chart, I saw that she may be placed today and wanted to follow-up with her in the event that she is dc'd,  Patient was in a good mood today and stated that she feels like she might be discharged today. She had a good day the previous day and received gifts for Easter.  She was talking with the nursing staff and security guard, joking with them.    [place] M.Div.

## 2016-03-24 NOTE — Discharge Instructions (Signed)
Suicidal Feelings: How to Help Yourself °Suicide is the taking of one's own life. If you feel as though life is getting too tough to handle and are thinking about suicide, get help right away. To get help: °· Call your local emergency services (911 in the [place] °· Call a suicide hotline to speak with a trained counselor who understands how you are feeling. The following is a list of suicide hotlines in the [place]. For a list of hotlines in [place], visit www.suicide.org/hotlines/international/[place]-suicide-hotlines.html. °¨  1-800-273-TALK (1-800-273-8255). °¨  1-800-SUICIDE (1-800-784-2433). °¨  1-888-628-9454. This is a hotline for Spanish speakers. °¨  1-800-799-4TTY (1-800-799-4889). This is a hotline for TTY users. °¨  1-866-4-U-TREVOR (1-866-488-7386). This is a hotline for lesbian, gay, bisexual, transgender, or questioning youth. °· Contact a crisis center or a local suicide prevention center. To find a crisis center or suicide prevention center: °¨ Call your local hospital, clinic, community service organization, mental health center, social service provider, or health department. Ask for assistance in connecting to a crisis center. °¨ Visit www.suicidepreventionlifeline.org/getinvolved/locator for a list of crisis centers in the [place], or visit www.suicideprevention.ca/thinking-about-suicide/find-a-crisis-centre for a list of centers in [place]. °· Visit the following websites: °¨  National Suicide Prevention Lifeline: www.suicidepreventionlifeline.org °¨  Hopeline: www.hopeline.com °¨  American Foundation for Suicide Prevention: www.afsp.org °¨  The Trevor Project (for lesbian, gay, bisexual, transgender, or questioning youth): www.thetrevorproject.org °HOW CAN I HELP MYSELF FEEL BETTER? °· Promise yourself that you will not do anything drastic when you have suicidal feelings. Remember, there is hope. Many people have gotten through suicidal thoughts and feelings, and you will, too. You may  have gotten through them before, and this proves that you can get through them again. °· Let family, friends, teachers, or counselors know how you are feeling. Try not to isolate yourself from those who care about you. Remember, they will want to help you. Talk with someone every day, even if you do not feel sociable. Face-to-face conversation is best. °· Call a mental health professional and see one regularly. °· Visit your primary health care provider every year. °· Eat a well-balanced diet, and space your meals so you eat regularly. °· Get plenty of rest. °· Avoid alcohol and drugs, and remove them from your home. They will only make you feel worse. °· If you are thinking of taking a lot of medicine, give your medicine to someone who can give it to you one day at a time. If you are on antidepressants and are concerned you will overdose, let your health care provider know so he or she can give you safer medicines. Ask your mental health professional about the possible side effects of any medicines you are taking. °· Remove weapons, poisons, knives, and anything else that could harm you from your home. °· Try to stick to routines. Follow a schedule every day. Put self-care on your schedule. °· Make a list of realistic goals, and cross them off when you achieve them. Accomplishments give a sense of worth. °· Wait until you are feeling better before doing the things you find difficult or unpleasant. °· Exercise if you are able. You will feel better if you exercise for even a half hour each day. °· Go out in the sun or into nature. This will help you recover from depression faster. If you have a favorite place to walk, go there. °· Do the things that have always given you pleasure. Play your favorite music, read a good book, paint a picture, play your favorite instrument, or do anything   else that takes your mind off your depression if it is safe to do. °· Keep your living space well lit. °· When you are feeling well,  write yourself a letter about tips and support that you can read when you are not feeling well. °· Remember that life's difficulties can be sorted out with help. Conditions can be treated. You can work on thoughts and strategies that serve you well. °  °This information is not intended to replace advice given to you by your health care provider. Make sure you discuss any questions you have with your health care provider. °  °Document Released: 05/31/2003 Document Revised: 12/15/2014 Document Reviewed: 03/21/2014 °Elsevier Interactive Patient Education ©2016 Elsevier Inc. ° °

## 2016-03-24 NOTE — Progress Notes (Addendum)
CSW met with Jamie Ross, Legal Guardian, [place] [name] and her co-worker on their visit to pick up patient to transport her to the facility. Legal Guardian signed the PASRR consent form. Patient received assistance to gather all of her belongings and she was discharged with Legal Guardian. CSW provided Legal Guardian with the hard copy of patient's prescriptions. CSW informed Legal Guardian that the PASRR had not been received at this time and she would be provided the number once received.   CSW spoke with Dawn Ferguson at [facility]. She stated she needed the FL2 and MAR faxed. CSW faxed the information and verified with Ms. Ferguson that the information was received. Fax# [phone] CSW informed Ms. Ferguson that the PASRR had not been received at this time and she would be provided the number once received. She stated that was fine.   CSW spoke with Megan at the [facility], [place], [place] [phone] to verify the fax number. Per Asst. Social Work Supervisor, CSW can fax the prescriptions with the fax verified. CSW verified the fax with Megan at the Pharmacy. CSW provided Megan with patient's drug allergies per telephone call. (Fax# [phone])   CSW spoke with Sonya regarding patient's PASRR. She stated they did not receive any information. She stated they needed H&P, FL2, PASRR consent form, and any psychiatry notes on patient. CSW faxed the information to [phone].   CSW received verification per Sonya with [facility] that the requested information was received. CSW responded to the message in the system in an effort of the message being released from the inbox.     , LCSWA [phone] ED CSW 10:47 AM 03/24/2016   

## 2017-05-14 DIAGNOSIS — G4730 Sleep apnea, unspecified: Secondary | ICD-10-CM | POA: Insufficient documentation

## 2017-05-14 DIAGNOSIS — F331 Major depressive disorder, recurrent, moderate: Secondary | ICD-10-CM | POA: Insufficient documentation

## 2017-05-14 DIAGNOSIS — J4520 Mild intermittent asthma, uncomplicated: Secondary | ICD-10-CM | POA: Insufficient documentation

## 2019-12-31 DIAGNOSIS — R0789 Other chest pain: Secondary | ICD-10-CM | POA: Insufficient documentation

## 2019-12-31 DIAGNOSIS — D72829 Elevated white blood cell count, unspecified: Secondary | ICD-10-CM | POA: Insufficient documentation

## 2022-03-04 DIAGNOSIS — I10 Essential (primary) hypertension: Secondary | ICD-10-CM | POA: Insufficient documentation

## 2022-03-04 DIAGNOSIS — R011 Cardiac murmur, unspecified: Secondary | ICD-10-CM | POA: Insufficient documentation

## 2023-04-15 ENCOUNTER — Ambulatory Visit (INDEPENDENT_AMBULATORY_CARE_PROVIDER_SITE_OTHER): Payer: 59 | Admitting: Nurse Practitioner

## 2023-04-15 ENCOUNTER — Encounter: Payer: Self-pay | Admitting: Nurse Practitioner

## 2023-04-15 VITALS — BP 124/76 | HR 64 | Temp 98.5°F | Ht 61.0 in | Wt 217.0 lb

## 2023-04-15 DIAGNOSIS — R011 Cardiac murmur, unspecified: Secondary | ICD-10-CM

## 2023-04-15 DIAGNOSIS — J302 Other seasonal allergic rhinitis: Secondary | ICD-10-CM

## 2023-04-15 DIAGNOSIS — G894 Chronic pain syndrome: Secondary | ICD-10-CM

## 2023-04-15 DIAGNOSIS — G4700 Insomnia, unspecified: Secondary | ICD-10-CM

## 2023-04-15 DIAGNOSIS — I10 Essential (primary) hypertension: Secondary | ICD-10-CM

## 2023-04-15 DIAGNOSIS — F4310 Post-traumatic stress disorder, unspecified: Secondary | ICD-10-CM | POA: Diagnosis not present

## 2023-04-15 DIAGNOSIS — R519 Headache, unspecified: Secondary | ICD-10-CM

## 2023-04-15 DIAGNOSIS — Z6841 Body Mass Index (BMI) 40.0 and over, adult: Secondary | ICD-10-CM

## 2023-04-15 DIAGNOSIS — K219 Gastro-esophageal reflux disease without esophagitis: Secondary | ICD-10-CM

## 2023-04-15 DIAGNOSIS — M797 Fibromyalgia: Secondary | ICD-10-CM

## 2023-04-15 DIAGNOSIS — F39 Unspecified mood [affective] disorder: Secondary | ICD-10-CM | POA: Diagnosis not present

## 2023-04-15 DIAGNOSIS — J45909 Unspecified asthma, uncomplicated: Secondary | ICD-10-CM

## 2023-04-15 DIAGNOSIS — R110 Nausea: Secondary | ICD-10-CM

## 2023-04-15 DIAGNOSIS — Z13220 Encounter for screening for lipoid disorders: Secondary | ICD-10-CM | POA: Diagnosis not present

## 2023-04-15 DIAGNOSIS — Z1329 Encounter for screening for other suspected endocrine disorder: Secondary | ICD-10-CM

## 2023-04-15 DIAGNOSIS — L84 Corns and callosities: Secondary | ICD-10-CM

## 2023-04-15 DIAGNOSIS — R6250 Unspecified lack of expected normal physiological development in childhood: Secondary | ICD-10-CM

## 2023-04-15 DIAGNOSIS — G8929 Other chronic pain: Secondary | ICD-10-CM

## 2023-04-15 LAB — CBC WITH DIFFERENTIAL/PLATELET
Basophils Absolute: 0 10*3/uL (ref 0.0–0.1)
Basophils Relative: 0.4 % (ref 0.0–3.0)
Eosinophils Absolute: 0.1 10*3/uL (ref 0.0–0.7)
Eosinophils Relative: 0.6 % (ref 0.0–5.0)
HCT: 38.2 % (ref 36.0–46.0)
Hemoglobin: 13.1 g/dL (ref 12.0–15.0)
Lymphocytes Relative: 26.5 % (ref 12.0–46.0)
Lymphs Abs: 2.6 10*3/uL (ref 0.7–4.0)
MCHC: 34.4 g/dL (ref 30.0–36.0)
MCV: 93.3 fl (ref 78.0–100.0)
Monocytes Absolute: 0.5 10*3/uL (ref 0.1–1.0)
Monocytes Relative: 5.5 % (ref 3.0–12.0)
Neutro Abs: 6.6 10*3/uL (ref 1.4–7.7)
Neutrophils Relative %: 67 % (ref 43.0–77.0)
Platelets: 338 10*3/uL (ref 150.0–400.0)
RBC: 4.1 Mil/uL (ref 3.87–5.11)
RDW: 13.6 % (ref 11.5–15.5)
WBC: 9.8 10*3/uL (ref 4.0–10.5)

## 2023-04-15 LAB — COMPREHENSIVE METABOLIC PANEL
ALT: 11 U/L (ref 0–35)
AST: 13 U/L (ref 0–37)
Albumin: 4 g/dL (ref 3.5–5.2)
Alkaline Phosphatase: 89 U/L (ref 39–117)
BUN: 12 mg/dL (ref 6–23)
CO2: 28 mEq/L (ref 19–32)
Calcium: 9.1 mg/dL (ref 8.4–10.5)
Chloride: 101 mEq/L (ref 96–112)
Creatinine, Ser: 0.72 mg/dL (ref 0.40–1.20)
GFR: 107.22 mL/min (ref >60.00)
Glucose, Bld: 91 mg/dL (ref 70–99)
Potassium: 4.1 mEq/L (ref 3.5–5.1)
Sodium: 137 mEq/L (ref 135–145)
Total Bilirubin: 0.3 mg/dL (ref 0.2–1.2)
Total Protein: 7 g/dL (ref 6.0–8.3)

## 2023-04-15 LAB — LDL CHOLESTEROL, DIRECT: Direct LDL: 99 mg/dL

## 2023-04-15 LAB — LIPID PANEL
Cholesterol: 159 mg/dL (ref 0–200)
HDL: 30.3 mg/dL — ABNORMAL LOW (ref >39.00)
NonHDL: 128.68
Total CHOL/HDL Ratio: 5
Triglycerides: 276 mg/dL — ABNORMAL HIGH (ref 0.0–149.0)
VLDL: 55.2 mg/dL — ABNORMAL HIGH (ref 0.0–40.0)

## 2023-04-15 LAB — HEMOGLOBIN A1C: Hgb A1c MFr Bld: 6 % (ref 4.6–6.5)

## 2023-04-15 LAB — TSH: TSH: 0.66 u[IU]/mL (ref 0.35–5.50)

## 2023-04-15 MED ORDER — CETIRIZINE HCL 10 MG PO TABS: 10.0000 mg | ORAL_TABLET | Freq: Every day | ORAL | 11 refills | Status: DC

## 2023-04-15 MED ORDER — BENZTROPINE MESYLATE 0.5 MG PO TABS: 0.5000 mg | ORAL_TABLET | Freq: Two times a day (BID) | ORAL | 0 refills | Status: DC

## 2023-04-15 MED ORDER — ONDANSETRON HCL 4 MG PO TABS: 4.0000 mg | ORAL_TABLET | Freq: Three times a day (TID) | ORAL | 2 refills | Status: DC | PRN

## 2023-04-15 MED ORDER — MELOXICAM 15 MG PO TABS
15.0000 mg | ORAL_TABLET | Freq: Every day | ORAL | 3 refills | Status: DC
Start: 2023-04-15 — End: 2023-12-22

## 2023-04-15 MED ORDER — ALBUTEROL SULFATE HFA 108 (90 BASE) MCG/ACT IN AERS
2.0000 | INHALATION_SPRAY | Freq: Four times a day (QID) | RESPIRATORY_TRACT | 5 refills | Status: AC | PRN
Start: 2023-04-15 — End: ?
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
## 2023-04-16 ENCOUNTER — Telehealth: Payer: Self-pay

## 2023-04-16 NOTE — Telephone Encounter (Signed)
LMOM to have guardian call back to discuss pts lab results :    Jamie Dicker, NP  Donavan Foil, CMA Her bad cholesterol is elevated and her A1c is prediabetic, she needs to work on healthy diet and exercise. We will plan to recheck in the future to monitor. The rest of her labs are stable.

## 2023-04-21 ENCOUNTER — Encounter: Payer: Self-pay | Admitting: Nurse Practitioner

## 2023-04-21 MED ORDER — TOPIRAMATE 25 MG PO TABS
25.0000 mg | ORAL_TABLET | Freq: Every day | ORAL | 3 refills | Status: DC
Start: 2023-04-21 — End: 2023-07-15

## 2023-04-21 NOTE — Assessment & Plan Note (Signed)
Chronic issue. Believes due to all her medications. Has seen GI in the past. Continue PRN Zofran. Refills sent. Will monitor.

## 2023-04-21 NOTE — Assessment & Plan Note (Signed)
Chronic. Stable at this time on current medication regimen. Continue. Will refer to Psychiatry for further management. Refills on Benztropine sent. Advised that future refills will need to come from Psych.

## 2023-04-21 NOTE — Assessment & Plan Note (Addendum)
Chronic. Stable on Omeprazole and with diet control. Continue. Encouraged to avoid dietary triggers.

## 2023-04-21 NOTE — Assessment & Plan Note (Addendum)
Chronic. Stable on Propranolol 10 mg twice daily. Continue.

## 2023-04-21 NOTE — Assessment & Plan Note (Addendum)
Chronic. Stable on current medication regimen. Continue. Denies SOB. Rarely using rescue inhaler. Refills sent.

## 2023-04-21 NOTE — Assessment & Plan Note (Signed)
Chronic issue. She used to take Topamax daily with relief, however medication was stopped for an unknown reason. Will start patient back on Topamax 25 mg QHS. She will contact if symptoms are worsening or changing. Will continue to monitor.

## 2023-04-21 NOTE — Assessment & Plan Note (Addendum)
Chronic. No longer getting symptom relief with Claritin. Will change to Zyrtec 10 mg daily. Advised to contact if continuing to not improve.
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
## 2023-04-21 NOTE — Assessment & Plan Note (Signed)
Worsening back pain. Will increase Meloxicam to 15 mg daily. Will refer to Physical Med/Pain Management for further evaluation.

## 2023-04-23 ENCOUNTER — Telehealth: Payer: Self-pay | Admitting: Nurse Practitioner

## 2023-04-23 NOTE — Telephone Encounter (Signed)
Revonda Standard from [facility] called stating they will have to deny the patient because the pt need a higher level of care and allison can recommend some one which [phone]

## 2023-04-28 NOTE — Addendum Note (Signed)
Addended by: Bethanie Dicker on: 04/28/2023 08:37 AM   Modules accepted: Orders

## 2023-05-01 ENCOUNTER — Telehealth: Payer: Self-pay | Admitting: Nurse Practitioner

## 2023-05-01 MED ORDER — FLUTICASONE PROPIONATE 50 MCG/ACT NA SUSP: NASAL | 3 refills | Status: DC

## 2023-05-01 NOTE — Telephone Encounter (Signed)
Pt fam members called in asking if Konrad Dolores can fill pt Psy meds for her. They said that they never heard from the place she referred her to. I provided them with a phone number they can call to scheduled that.    Prescription Request  05/01/2023  LOV: 04/15/2023  What is the name of the medication or equipment? LYBALVI 10-10 MG TABS, prazosin (MINIPRESS) 2 MG capsule, fluticasone (FLONASE) 50 MCG/ACT nasal spray, omeprazole (PRILOSEC) 40 MG capsule and montelukast (SINGULAIR) 10 MG tablet.  Have you contacted your pharmacy to request a refill? Yes   Which pharmacy would you like this sent to?   [id number] - [place], [place] - [address] [place] [address] Phone: [phone] Fax: [phone]    Patient notified that their request is being sent to the clinical staff for review and that they should receive a response within 2 business days.   Please advise at [facility] [phone]

## 2023-05-06 ENCOUNTER — Telehealth: Payer: Self-pay | Admitting: Nurse Practitioner

## 2023-05-06 NOTE — Telephone Encounter (Signed)
Pt fam member called in asking if we received pt medical records from [place].?

## 2023-05-06 NOTE — Telephone Encounter (Signed)
Called family back and  received the info to request records Father told me [facility] and Mother confirmed Dr. Jerold Coombe was her PCP. I searched [facility] up and Dr. Jerold Coombe is associated with them. I faxed over to receive records at fax number [phone]

## 2023-05-07 ENCOUNTER — Ambulatory Visit: Payer: 59 | Admitting: Podiatry

## 2023-05-07 NOTE — Telephone Encounter (Signed)
Patient's father, Jamie Ross, called to state he has names and numbers for [place] to contact to obtain additional medical records for patient:  [name] - phone: [phone] (PCP) Dr. Janelle Floor - [address] of the [place], [place], [place] - phone: [phone]  [facility] [place], [place], [place] - phone: [phone] (Psych meds) [facility], [address] [place], [place] - phone: [phone] (ENT) [facility], [address], [place], [place] - phone: [phone] Cardiologist - in [place], [place] - phone: [phone]

## 2023-05-12 NOTE — Telephone Encounter (Signed)
Pt fam member called in stating that they reached out to psy in [place], however, the appt its in July, and they calling to get her meds from [place] until they appt.   Latavia CMA can you call them? They were telling how they would like to change the dose in one of them meds, and also they would like to change a dissolve pill.

## 2023-05-18 ENCOUNTER — Other Ambulatory Visit: Payer: Self-pay | Admitting: Nurse Practitioner

## 2023-05-18 DIAGNOSIS — F39 Unspecified mood [affective] disorder: Secondary | ICD-10-CM

## 2023-05-18 DIAGNOSIS — R110 Nausea: Secondary | ICD-10-CM

## 2023-05-18 DIAGNOSIS — I10 Essential (primary) hypertension: Secondary | ICD-10-CM

## 2023-05-18 MED ORDER — ONDANSETRON HCL 4 MG PO TABS
4.0000 mg | ORAL_TABLET | Freq: Three times a day (TID) | ORAL | 2 refills | Status: DC | PRN
Start: 2023-05-18 — End: 2023-05-18

## 2023-05-18 MED ORDER — HALOPERIDOL 5 MG PO TABS
2.5000 mg | ORAL_TABLET | Freq: Two times a day (BID) | ORAL | 0 refills | Status: DC
Start: 2023-05-18 — End: 2024-03-08

## 2023-05-18 MED ORDER — PRAZOSIN HCL 2 MG PO CAPS: 2.0000 mg | ORAL_CAPSULE | Freq: Two times a day (BID) | ORAL | 0 refills | Status: DC

## 2023-05-18 MED ORDER — LYBALVI 10-10 MG PO TABS
1.0000 | ORAL_TABLET | Freq: Every day | ORAL | 0 refills | Status: DC
Start: 2023-05-18 — End: 2023-07-29

## 2023-05-18 MED ORDER — ONDANSETRON 4 MG PO TBDP
4.0000 mg | ORAL_TABLET | Freq: Three times a day (TID) | ORAL | 5 refills | Status: DC | PRN
Start: 2023-05-18 — End: 2023-08-28

## 2023-05-18 NOTE — Telephone Encounter (Signed)
Pt's mother has been informed.

## 2023-05-28 ENCOUNTER — Telehealth: Payer: Self-pay | Admitting: Nurse Practitioner

## 2023-05-28 ENCOUNTER — Other Ambulatory Visit: Payer: Self-pay

## 2023-05-28 ENCOUNTER — Other Ambulatory Visit: Payer: Self-pay | Admitting: Nurse Practitioner

## 2023-05-28 DIAGNOSIS — Z1231 Encounter for screening mammogram for malignant neoplasm of breast: Secondary | ICD-10-CM

## 2023-05-28 DIAGNOSIS — N6489 Other specified disorders of breast: Secondary | ICD-10-CM

## 2023-05-28 DIAGNOSIS — R928 Other abnormal and inconclusive findings on diagnostic imaging of breast: Secondary | ICD-10-CM

## 2023-05-28 NOTE — Telephone Encounter (Signed)
Mammogram has been ordered and father was given the phone number to [facility] where ordered was placed. [phone]

## 2023-05-28 NOTE — Telephone Encounter (Signed)
Pt mom called in about a mammogram pt needs to have?? And she haven't hear anything as of yet. Please advice.

## 2023-05-29 ENCOUNTER — Other Ambulatory Visit: Payer: Self-pay | Admitting: *Deleted

## 2023-05-29 ENCOUNTER — Inpatient Hospital Stay
Admission: RE | Admit: 2023-05-29 | Discharge: 2023-05-29 | Disposition: A | Discharge status: Home / Self Care | Payer: Self-pay | Source: Ambulatory Visit | Attending: Nurse Practitioner | Admitting: Nurse Practitioner

## 2023-05-29 DIAGNOSIS — Z1231 Encounter for screening mammogram for malignant neoplasm of breast: Secondary | ICD-10-CM

## 2023-06-03 ENCOUNTER — Ambulatory Visit
Admission: RE | Admit: 2023-06-03 | Discharge: 2023-06-03 | Disposition: A | Discharge status: Home / Self Care | Payer: 59 | Source: Ambulatory Visit | Attending: Nurse Practitioner | Admitting: Nurse Practitioner

## 2023-06-03 DIAGNOSIS — R928 Other abnormal and inconclusive findings on diagnostic imaging of breast: Secondary | ICD-10-CM | POA: Diagnosis present

## 2023-06-03 DIAGNOSIS — N6489 Other specified disorders of breast: Secondary | ICD-10-CM

## 2023-06-09 ENCOUNTER — Ambulatory Visit: Payer: 59 | Admitting: Nurse Practitioner

## 2023-06-10 ENCOUNTER — Ambulatory Visit (INDEPENDENT_AMBULATORY_CARE_PROVIDER_SITE_OTHER): Payer: 59 | Admitting: Nurse Practitioner

## 2023-06-10 ENCOUNTER — Encounter: Payer: Self-pay | Admitting: Nurse Practitioner

## 2023-06-10 VITALS — BP 122/70 | HR 66 | Temp 97.9°F | Ht 61.0 in | Wt 216.6 lb

## 2023-06-10 DIAGNOSIS — R399 Unspecified symptoms and signs involving the genitourinary system: Secondary | ICD-10-CM | POA: Diagnosis not present

## 2023-06-10 DIAGNOSIS — R8290 Unspecified abnormal findings in urine: Secondary | ICD-10-CM | POA: Diagnosis not present

## 2023-06-10 LAB — URINALYSIS, ROUTINE W REFLEX MICROSCOPIC
Bilirubin Urine: NEGATIVE
Hgb urine dipstick: NEGATIVE
Ketones, ur: NEGATIVE
Nitrite: NEGATIVE
Specific Gravity, Urine: 1.01 (ref 1.000–1.030)
Total Protein, Urine: NEGATIVE
Urine Glucose: NEGATIVE
Urobilinogen, UA: 1 (ref 0.0–1.0)
pH: 6 (ref 5.0–8.0)

## 2023-06-10 LAB — POC URINALSYSI DIPSTICK (AUTOMATED)
Bilirubin, UA: NEGATIVE
Blood, UA: NEGATIVE
Glucose, UA: NEGATIVE
Ketones, UA: NEGATIVE
Nitrite, UA: NEGATIVE
Protein, UA: NEGATIVE
Spec Grav, UA: 1.015 (ref 1.010–1.025)
Urobilinogen, UA: 1 E.U./dL
pH, UA: 6 (ref 5.0–8.0)

## 2023-06-10 MED ORDER — CIPROFLOXACIN HCL 500 MG PO TABS
500.0000 mg | ORAL_TABLET | Freq: Two times a day (BID) | ORAL | 0 refills | Status: AC
Start: 2023-06-10 — End: 2023-06-17

## 2023-06-10 NOTE — Assessment & Plan Note (Signed)
Symptoms consistent with acute cystitis. Concern for pyelo given significant back pain and tenderness on exam. UA in office with leukocytes only. Microscopic and culture pending. Will contact with results. Will treat with Cipro 500 mg BID x 7 days. Encouraged adequate fluid intake. Return precautions given to patient.

## 2023-06-10 NOTE — Progress Notes (Signed)
Bethanie Dicker, NP-C Phone: [phone]  Jamie Ross is a 37 y.o. female who presents today with caregiver for UTI symptoms.   Patient with bilateral kidney/back pain and malodorous urine for approximately 2 weeks.  UTI:  Dysuria- Yes  Frequency- Yes   Urgency- Yes   Hematuria- No   Fever- No  Abd pain- Yes, lower abdomen/bladder pressure   Vaginal d/c- Yes   Social History   Tobacco Use  Smoking Status Light Smoker   Packs/day: 0.25   Years: 0.50   Additional pack years: 0.00   Total pack years: 0.13   Types: Cigarettes  Smokeless Tobacco Never    Current Outpatient Medications on File Prior to Visit  Medication Sig Dispense Refill   albuterol (PROAIR HFA) 108 (90 Base) MCG/ACT inhaler Inhale 2 puffs into the lungs every 6 (six) hours as needed for wheezing or shortness of breath. 8 g 5   benztropine (COGENTIN) 0.5 MG tablet Take 1 tablet (0.5 mg total) by mouth 2 (two) times daily. 180 tablet 0   budesonide-formoterol (SYMBICORT) 160-4.5 MCG/ACT inhaler Inhale 2 puffs into the lungs 2 (two) times daily. 1 Inhaler 0   cetirizine (ZYRTEC) 10 MG tablet Take 1 tablet (10 mg total) by mouth daily. 30 tablet 11   fluticasone (FLONASE) 50 MCG/ACT nasal spray 1 spray into each nostril daily. 15.8 mL 3   haloperidol (HALDOL) 5 MG tablet Take 0.5 tablets (2.5 mg total) by mouth 2 (two) times daily. 30 tablet 0   LINZESS 72 MCG capsule Take 72 mcg by mouth daily.     LYBALVI 10-10 MG TABS Take 1 tablet by mouth at bedtime. 30 tablet 0   meloxicam (MOBIC) 15 MG tablet Take 1 tablet (15 mg total) by mouth daily. 90 tablet 3   montelukast (SINGULAIR) 10 MG tablet Take 1 tablet (10 mg total) by mouth at bedtime. 30 tablet 0   omeprazole (PRILOSEC) 40 MG capsule Take 40 mg by mouth every morning.     ondansetron (ZOFRAN-ODT) 4 MG disintegrating tablet Take 1 tablet (4 mg total) by mouth every 8 (eight) hours as needed for nausea or vomiting. 30 tablet 5   prazosin (MINIPRESS) 2 MG  capsule Take 1 capsule (2 mg total) by mouth 2 (two) times daily. 60 capsule 0   propranolol (INDERAL) 10 MG tablet Take 10 mg by mouth 2 (two) times daily.     sertraline (ZOLOFT) 50 MG tablet Take 50 mg by mouth daily.     topiramate (TOPAMAX) 25 MG tablet Take 1 tablet (25 mg total) by mouth at bedtime. 90 tablet 3   [DISCONTINUED] hydrochlorothiazide (MICROZIDE) 12.5 MG capsule Take 12.5 mg by mouth daily.     [DISCONTINUED] potassium chloride SA (K-DUR,KLOR-CON) 20 MEQ tablet Take 20 mEq by mouth daily.     No current facility-administered medications on file prior to visit.    ROS see history of present illness  Objective  Physical Exam Vitals:   06/10/23 1047  BP: 122/70  Pulse: 66  Temp: 97.9 F (36.6 C)  SpO2: 96%    BP Readings from Last 3 Encounters:  06/10/23 122/70  04/15/23 124/76  03/24/16 110/62   Wt Readings from Last 3 Encounters:  06/10/23 216 lb 9.6 oz (98.2 kg)  04/15/23 217 lb (98.4 kg)  01/28/16 190 lb (86.2 kg)    Physical Exam Constitutional:      General: She is not in acute distress.    Appearance: Normal appearance.  HENT:  Head: Normocephalic.  Cardiovascular:     Rate and Rhythm: Normal rate and regular rhythm.     Heart sounds: Normal heart sounds.  Pulmonary:     Effort: Pulmonary effort is normal.     Breath sounds: Normal breath sounds.  Abdominal:     Tenderness: There is right CVA tenderness and left CVA tenderness.  Skin:    General: Skin is warm and dry.  Neurological:     General: No focal deficit present.     Mental Status: She is alert.  Psychiatric:        Mood and Affect: Mood normal.        Behavior: Behavior normal.    Assessment/Plan: Please see individual problem list.  UTI symptoms Assessment & Plan: Symptoms consistent with acute cystitis. Concern for pyelo given significant back pain and tenderness on exam. UA in office with leukocytes only. Microscopic and culture pending. Will contact with results.  Will treat with Cipro 500 mg BID x 7 days. Encouraged adequate fluid intake. Return precautions given to patient.   Orders: -     Ciprofloxacin HCl; Take 1 tablet (500 mg total) by mouth 2 (two) times daily for 7 days.  Dispense: 14 tablet; Refill: 0  Malodorous urine -     POCT Urinalysis Dipstick (Automated) -     Urinalysis, Routine w reflex microscopic -     Urine Culture    Return if symptoms worsen or fail to improve.   Bethanie Dicker, NP-C [facility] - [employer]

## 2023-06-11 LAB — URINE CULTURE
MICRO NUMBER:: 15158980
SPECIMEN QUALITY:: ADEQUATE

## 2023-06-14 DIAGNOSIS — Z79899 Other long term (current) drug therapy: Secondary | ICD-10-CM | POA: Insufficient documentation

## 2023-06-14 DIAGNOSIS — Z789 Other specified health status: Secondary | ICD-10-CM | POA: Insufficient documentation

## 2023-06-14 DIAGNOSIS — M899 Disorder of bone, unspecified: Secondary | ICD-10-CM | POA: Insufficient documentation

## 2023-06-14 NOTE — Patient Instructions (Incomplete)
____________________________________________________________________________________________  New Patients  Welcome to [facility].   Initial Visit The first or initial visit consists of an evaluation only.   Interventional pain management.  We offer therapies other than opioid controlled substances to manage chronic pain. These include, but are not limited to, diagnostic, therapeutic, and palliative specialized injection therapies (i.e.: Epidural Steroids, Facet Blocks, etc.). We specialize in a variety of nerve blocks as well as radiofrequency treatments. We offer pain implant evaluations and trials, as well as follow up management. In addition we also provide a variety joint injections, including Viscosupplementation (AKA: Gel Therapy).  Prescription Pain Medication. We specialize in alternatives to opioids. We can provide evaluations and recommendations for/of pharmacologic therapies based on CDC Guidelines.  We no longer take patients for long-term medication management. We will not be taking over your pain medications.  ____________________________________________________________________________________________    ____________________________________________________________________________________________  Patient Information update  To: All of our patients.  Re: Name change.  It has been made official that our current name, "[facility]"   will soon be changed to "[facility]".   The purpose of this change is to eliminate any confusion created by the concept of our practice being a "Medication Management Pain Clinic". In the past this has led to the misconception that we treat pain primarily by the use of prescription medications.  Nothing can be farther from the truth.   Understanding PAIN MANAGEMENT: To  further understand what our practice does, you first have to understand that "Pain Management" is a subspecialty that requires additional training once a physician has completed their specialty training, which can be in either Anesthesia, Neurology, Psychiatry, or Physical Medicine and Rehabilitation (PMR). Each one of these contributes to the final approach taken by each physician to the management of their patient's pain. To be a "Pain Management Specialist" you must have first completed one of the specialty trainings below.  Anesthesiologists - trained in clinical pharmacology and interventional techniques such as nerve blockade and regional as well as central neuroanatomy. They are trained to block pain before, during, and after surgical interventions.  Neurologists - trained in the diagnosis and pharmacological treatment of complex neurological conditions, such as Multiple Sclerosis, Parkinson's, spinal cord injuries, and other systemic conditions that may be associated with symptoms that may include but are not limited to pain. They tend to rely primarily on the treatment of chronic pain using prescription medications.  Psychiatrist - trained in conditions affecting the psychosocial wellbeing of patients including but not limited to depression, anxiety, schizophrenia, personality disorders, addiction, and other substance use disorders that may be associated with chronic pain. They tend to rely primarily on the treatment of chronic pain using prescription medications.   Physical Medicine and Rehabilitation (PMR) physicians, also known as physiatrists - trained to treat a wide variety of medical conditions affecting the brain, spinal cord, nerves, bones, joints, ligaments, muscles, and tendons. Their training is primarily aimed at treating patients that have suffered injuries that have caused severe physical impairment. Their training is primarily aimed at the physical therapy and rehabilitation of those  patients. They may also work alongside orthopedic surgeons or neurosurgeons using their expertise in assisting surgical patients to recover after their surgeries.  INTERVENTIONAL PAIN MANAGEMENT is sub-subspecialty of Pain Management.  Our physicians are Board-certified in Anesthesia, Pain Management, and Interventional Pain Management.  This meaning that not only have they been trained   and Board-certified in their specialty of Anesthesia, and subspecialty of Pain Management, but they have also received further training in the sub-subspecialty of Interventional Pain Management, in order to become Board-certified as INTERVENTIONAL PAIN MANAGEMENT SPECIALIST.    Mission: Our goal is to use our skills in  INTERVENTIONAL PAIN MANAGEMENT as alternatives to the chronic use of prescription opioid medications for the treatment of pain. To make this more clear, we have changed our name to reflect what we do and offer. We will continue to offer medication management assessment and recommendations, but we will not be taking over any patient's medication management.  ____________________________________________________________________________________________     

## 2023-06-14 NOTE — Progress Notes (Addendum)
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   Diagnostic bilateral lumbar facet MBB #1  PT Dx X-rays Labwork   Under consideration:   Diagnostic bilateral lumbar facet MBB #1    Completed:   None at this time   Therapeutic  Palliative (PRN) options:   None established   Completed by other providers:   Therapeutic right L2, L3, L4, and L5 "lumbosacral SNRB" x1 (04/23/2015) by Dr. Ewing Schlein (N/A)      Provider-requested follow-up: Return for (ECT): (B) L-FCT Blk #1.  Future Appointments  Date Time Provider  Department Center  10/16/2023  8:40 AM Bethanie Dicker, NP [facility]    Duration of encounter: 46 minutes.  Total time on encounter, as per AMA guidelines included both the face-to-face and non-face-to-face time personally spent by the physician and/or other qualified health care professional(s) on the  day of the encounter (includes time in activities that require the physician or other qualified health care professional and does not include time in activities normally performed by clinical staff). Physician's time may include the following activities when performed: Preparing to see the patient (e.g., pre-charting review of records, searching for previously ordered imaging, lab work, and nerve conduction tests) Review of prior analgesic pharmacotherapies. Reviewing PMP Interpreting ordered tests (e.g., lab work, imaging, nerve conduction tests) Performing post-procedure evaluations, including interpretation of diagnostic procedures Obtaining and/or reviewing separately obtained history Performing a medically appropriate examination and/or evaluation Counseling and educating the patient/family/caregiver Ordering medications, tests, or procedures Referring and communicating with other health care professionals (when not separately reported) Documenting clinical information in the electronic or other health record Independently interpreting results (not separately reported) and communicating results to the patient/ family/caregiver Care coordination (not separately reported)  Note by: Oswaldo Done, MD (TTS technology used. I apologize for any typographical errors that were not detected and corrected.) Date: 06/15/2023; Time: 10:52 AM

## 2023-06-15 ENCOUNTER — Ambulatory Visit: Admission: RE | Admit: 2023-06-15 | Payer: 59 | Source: Ambulatory Visit | Admitting: Pain Medicine

## 2023-06-15 ENCOUNTER — Telehealth: Payer: Self-pay | Admitting: *Deleted

## 2023-06-15 ENCOUNTER — Encounter: Payer: Self-pay | Admitting: Pain Medicine

## 2023-06-15 ENCOUNTER — Ambulatory Visit (HOSPITAL_BASED_OUTPATIENT_CLINIC_OR_DEPARTMENT_OTHER): Payer: 59 | Admitting: Pain Medicine

## 2023-06-15 ENCOUNTER — Ambulatory Visit
Admission: RE | Admit: 2023-06-15 | Discharge: 2023-06-15 | Disposition: A | Discharge status: Home / Self Care | Payer: 59 | Source: Ambulatory Visit | Attending: Pain Medicine | Admitting: Pain Medicine

## 2023-06-15 VITALS — BP 122/88 | HR 65 | Temp 97.5°F | Resp 16 | Ht 64.0 in | Wt 217.0 lb

## 2023-06-15 DIAGNOSIS — M5459 Other low back pain: Secondary | ICD-10-CM

## 2023-06-15 DIAGNOSIS — G8929 Other chronic pain: Secondary | ICD-10-CM | POA: Insufficient documentation

## 2023-06-15 DIAGNOSIS — Z789 Other specified health status: Secondary | ICD-10-CM

## 2023-06-15 DIAGNOSIS — M47817 Spondylosis without myelopathy or radiculopathy, lumbosacral region: Secondary | ICD-10-CM

## 2023-06-15 DIAGNOSIS — M545 Low back pain, unspecified: Secondary | ICD-10-CM | POA: Insufficient documentation

## 2023-06-15 DIAGNOSIS — M79604 Pain in right leg: Secondary | ICD-10-CM | POA: Insufficient documentation

## 2023-06-15 DIAGNOSIS — G894 Chronic pain syndrome: Secondary | ICD-10-CM

## 2023-06-15 DIAGNOSIS — M899 Disorder of bone, unspecified: Secondary | ICD-10-CM | POA: Insufficient documentation

## 2023-06-15 DIAGNOSIS — Z79899 Other long term (current) drug therapy: Secondary | ICD-10-CM

## 2023-06-15 DIAGNOSIS — M47816 Spondylosis without myelopathy or radiculopathy, lumbar region: Secondary | ICD-10-CM

## 2023-06-15 NOTE — Progress Notes (Signed)
Safety precautions to be maintained throughout the outpatient stay will include: orient to surroundings, keep bed in low position, maintain call bell within reach at all times, provide assistance with transfer out of bed and ambulation.  

## 2023-06-19 ENCOUNTER — Telehealth: Payer: Self-pay | Admitting: Nurse Practitioner

## 2023-06-19 NOTE — Telephone Encounter (Signed)
Copied from CRM [id number]. Topic: Medicare AWV >> Jun 19, 2023 12:58 PM Payton Doughty wrote: Reason for CRM: N/A 06/19/2023 FOR AWV - NO VOICEMAIL  Verlee Rossetti; Care Guide Ambulatory Clinical Support [place] [facility] Direct Dial: [phone]

## 2023-06-20 LAB — 25-HYDROXY VITAMIN D LCMS D2+D3
25-Hydroxy, Vitamin D-2: <1 ng/mL
25-Hydroxy, Vitamin D-3: 24 ng/mL
25-Hydroxy, Vitamin D: 24 ng/mL — ABNORMAL LOW

## 2023-06-20 LAB — MAGNESIUM: Magnesium: 2.1 mg/dL (ref 1.6–2.3)

## 2023-06-20 LAB — C-REACTIVE PROTEIN: CRP: 6 mg/L (ref 0–10)

## 2023-06-20 LAB — SEDIMENTATION RATE: Sed Rate: 12 mm/hr (ref 0–32)

## 2023-06-20 LAB — VITAMIN B12: Vitamin B-12: 348 pg/mL (ref 232–1245)

## 2023-06-29 ENCOUNTER — Telehealth: Payer: Self-pay | Admitting: Pain Medicine

## 2023-06-29 NOTE — Telephone Encounter (Signed)
Wrap up indicates patient is to return for bilateral lumbar facets in ect, but no order was placed.

## 2023-06-29 NOTE — Telephone Encounter (Signed)
PT father called to see had order been place for patient to get procedure done. However order hasn't been put in so JM can't get autho. Can you Laban Emperor to place order on tomorrow. TY

## 2023-06-30 DIAGNOSIS — M47816 Spondylosis without myelopathy or radiculopathy, lumbar region: Secondary | ICD-10-CM | POA: Insufficient documentation

## 2023-06-30 DIAGNOSIS — M47817 Spondylosis without myelopathy or radiculopathy, lumbosacral region: Secondary | ICD-10-CM | POA: Insufficient documentation

## 2023-06-30 DIAGNOSIS — M5459 Other low back pain: Secondary | ICD-10-CM | POA: Insufficient documentation

## 2023-06-30 NOTE — Addendum Note (Signed)
Addended by: Delano Metz A on: 06/30/2023 10:52 AM   Modules accepted: Orders

## 2023-07-09 NOTE — Telephone Encounter (Signed)
Close encounter 

## 2023-07-13 ENCOUNTER — Telehealth: Payer: Self-pay | Admitting: Nurse Practitioner

## 2023-07-13 DIAGNOSIS — R011 Cardiac murmur, unspecified: Secondary | ICD-10-CM

## 2023-07-13 DIAGNOSIS — I10 Essential (primary) hypertension: Secondary | ICD-10-CM

## 2023-07-13 NOTE — Progress Notes (Unsigned)
PROVIDER NOTE: Interpretation of information contained herein should be left to medically-trained personnel. Specific patient instructions are provided elsewhere under "Patient Instructions" section of medical record. This document was created in part using STT-dictation technology, any transcriptional errors that may result from this process are unintentional.  Patient: Jamie Ross Type: Established DOB: [birth year] MRN: [id number] PCP: Bethanie Dicker, NP  Service: Procedure DOS: 07/14/2023 Setting: Ambulatory Location: Ambulatory outpatient facility Delivery: Face-to-face Provider: Oswaldo Done, MD Specialty: Interventional Pain Management Specialty designation: 09 Location: Outpatient facility Ref. Prov.: Delano Metz, MD       Interventional Therapy   Procedure: Lumbar Facet, Medial Branch Block(s) #1  Laterality: Bilateral  Level: T12, L1, L2, L3, L4, L5, and S1 Medial Branch Level(s). Injecting these levels blocks the L1-2, L2-3, L3-4, L4-5, and L5-S1 lumbar facet joints. Imaging: Fluoroscopic guidance         Anesthesia: Local anesthesia (1-2% Lidocaine) Anxiolysis: IV Versed         Sedation:                         DOS: 07/14/2023 Performed by: Oswaldo Done, MD  Primary Purpose: Diagnostic/Therapeutic Indications: Low back pain severe enough to impact quality of life or function. 1. Chronic low back pain (1ry area of Pain) (Bilateral) w/o sciatica   2. Lumbar facet joint pain   3. Lumbar facet joint syndrome   4. Spondylosis without myelopathy or radiculopathy, lumbosacral region    NAS-11 Pain score:   Pre-procedure:  /10   Post-procedure:  /10     Position / Prep / Materials:  Position: Prone  Prep solution: DuraPrep (Iodine Povacrylex [0.7% available iodine] and Isopropyl Alcohol, 74% w/w) Area Prepped: Posterolateral Lumbosacral Spine (Wide prep: From the lower border of the scapula down to the end of the tailbone and from flank to  flank.)  Materials:  Tray: Block Needle(s):  Type: Spinal  Gauge (G): 22  Length: 5-in Qty: 4      H&P (Pre-op Assessment):  Jamie Ross is a 37 y.o. (year old), female patient, seen today for interventional treatment. She  has a past surgical history that includes Cholecystectomy; Tonsillectomy; Wisdom tooth extraction; Endometrial ablation; and Umbilical hernia repair. Jamie Ross has a current medication list which includes the following prescription(s): acetaminophen, albuterol, benztropine, budesonide-formoterol, cetirizine, fluticasone, haloperidol, linzess, lybalvi, meloxicam, montelukast, omeprazole, ondansetron, prazosin, propranolol, sertraline, topiramate, [DISCONTINUED] hydrochlorothiazide, and [DISCONTINUED] potassium chloride sa. Her primarily concern today is the No chief complaint on file.  Initial Vital Signs:  Pulse/HCG Rate:    Temp:   Resp:   BP:   SpO2:    BMI: Estimated body mass index is 37.25 kg/m as calculated from the following:   Height as of 06/15/23: 5\' 4"  (1.626 m).   Weight as of 06/15/23: 217 lb (98.4 kg).  Risk Assessment: Allergies: Reviewed. She is allergic to doxycycline, medroxyprogesterone, methylprednisolone, penicillins, risperidone, risperidone and related, septra [sulfamethoxazole-trimethoprim], and sulfamethoxazole-trimethoprim.  Allergy Precautions: None required Coagulopathies: Reviewed. None identified.  Blood-thinner therapy: None at this time Active Infection(s): Reviewed. None identified. Jamie Ross is afebrile  Site Confirmation: Jamie Ross was asked to confirm the procedure and laterality before marking the site Procedure checklist: Completed Consent: Before the procedure and under the influence of no sedative(s), amnesic(s), or anxiolytics, the patient was informed of the treatment options, risks and possible complications. To fulfill our ethical and legal obligations, as recommended by the American Medical Association's Code of  Ethics, I have  informed the patient of my clinical impression; the nature and purpose of the treatment or procedure; the risks, benefits, and possible complications of the intervention; the alternatives, including doing nothing; the risk(s) and benefit(s) of the alternative treatment(s) or procedure(s); and the risk(s) and benefit(s) of doing nothing. The patient was provided information about the general risks and possible complications associated with the procedure. These may include, but are not limited to: failure to achieve desired goals, infection, bleeding, organ or nerve damage, allergic reactions, paralysis, and death. In addition, the patient was informed of those risks and complications associated to Spine-related procedures, such as failure to decrease pain; infection (i.e.: Meningitis, epidural or intraspinal abscess); bleeding (i.e.: epidural hematoma, subarachnoid hemorrhage, or any other type of intraspinal or peri-dural bleeding); organ or nerve damage (i.e.: Any type of peripheral nerve, nerve root, or spinal cord injury) with subsequent damage to sensory, motor, and/or autonomic systems, resulting in permanent pain, numbness, and/or weakness of one or several areas of the body; allergic reactions; (i.e.: anaphylactic reaction); and/or death. Furthermore, the patient was informed of those risks and complications associated with the medications. These include, but are not limited to: allergic reactions (i.e.: anaphylactic or anaphylactoid reaction(s)); adrenal axis suppression; blood sugar elevation that in diabetics may result in ketoacidosis or comma; water retention that in patients with history of congestive heart failure may result in shortness of breath, pulmonary edema, and decompensation with resultant heart failure; weight gain; swelling or edema; medication-induced neural toxicity; particulate matter embolism and blood vessel occlusion with resultant organ, and/or nervous system  infarction; and/or aseptic necrosis of one or more joints. Finally, the patient was informed that Medicine is not an exact science; therefore, there is also the possibility of unforeseen or unpredictable risks and/or possible complications that may result in a catastrophic outcome. The patient indicated having understood very clearly. We have given the patient no guarantees and we have made no promises. Enough time was given to the patient to ask questions, all of which were answered to the patient's satisfaction. Ms. Nickel has indicated that she wanted to continue with the procedure. Attestation: I, the ordering provider, attest that I have discussed with the patient the benefits, risks, side-effects, alternatives, likelihood of achieving goals, and potential problems during recovery for the procedure that I have provided informed consent. Date  Time: {CHL [facility] TIME CHOICES:21018001}   Pre-Procedure Preparation:  Monitoring: As per clinic protocol. Respiration, ETCO2, SpO2, BP, heart rate and rhythm monitor placed and checked for adequate function Safety Precautions: Patient was assessed for positional comfort and pressure points before starting the procedure. Time-out: I initiated and conducted the "Time-out" before starting the procedure, as per protocol. The patient was asked to participate by confirming the accuracy of the "Time Out" information. Verification of the correct person, site, and procedure were performed and confirmed by me, the nursing staff, and the patient. "Time-out" conducted as per Joint Commission's Universal Protocol (UP.01.01.01). Time:   Start Time:   hrs.  Description of Procedure:          Laterality: (see above) Targeted Levels: (see above)  Safety Precautions: Aspiration looking for blood return was conducted prior to all injections. At no point did we inject any substances, as a needle was being advanced. Before injecting, the patient was told to immediately  notify me if she was experiencing any new onset of "ringing in the ears, or metallic taste in the mouth". No attempts were made at seeking any paresthesias. Safe injection practices and needle  disposal techniques used. Medications properly checked for expiration dates. SDV (single dose vial) medications used. After the completion of the procedure, all disposable equipment used was discarded in the proper designated medical waste containers. Local Anesthesia: Protocol guidelines were followed. The patient was positioned over the fluoroscopy table. The area was prepped in the usual manner. The time-out was completed. The target area was identified using fluoroscopy. A 12-in long, straight, sterile hemostat was used with fluoroscopic guidance to locate the targets for each level blocked. Once located, the skin was marked with an approved surgical skin marker. Once all sites were marked, the skin (epidermis, dermis, and hypodermis), as well as deeper tissues (fat, connective tissue and muscle) were infiltrated with a small amount of a short-acting local anesthetic, loaded on a 10cc syringe with a 25G, 1.5-in  Needle. An appropriate amount of time was allowed for local anesthetics to take effect before proceeding to the next step. Local Anesthetic: Lidocaine 2.0% The unused portion of the local anesthetic was discarded in the proper designated containers. Technical description of process:  L2 Medial Branch Nerve Block (MBB): The target area for the L2 medial branch is at the junction of the postero-lateral aspect of the superior articular process and the superior, posterior, and medial edge of the transverse process of L3. Under fluoroscopic guidance, a Quincke needle was inserted until contact was made with os over the superior postero-lateral aspect of the pedicular shadow (target area). After negative aspiration for blood, 0.5 mL of the nerve block solution was injected without difficulty or complication. The  needle was removed intact. L3 Medial Branch Nerve Block (MBB): The target area for the L3 medial branch is at the junction of the postero-lateral aspect of the superior articular process and the superior, posterior, and medial edge of the transverse process of L4. Under fluoroscopic guidance, a Quincke needle was inserted until contact was made with os over the superior postero-lateral aspect of the pedicular shadow (target area). After negative aspiration for blood, 0.5 mL of the nerve block solution was injected without difficulty or complication. The needle was removed intact. L4 Medial Branch Nerve Block (MBB): The target area for the L4 medial branch is at the junction of the postero-lateral aspect of the superior articular process and the superior, posterior, and medial edge of the transverse process of L5. Under fluoroscopic guidance, a Quincke needle was inserted until contact was made with os over the superior postero-lateral aspect of the pedicular shadow (target area). After negative aspiration for blood, 0.5 mL of the nerve block solution was injected without difficulty or complication. The needle was removed intact. L5 Medial Branch Nerve Block (MBB): The target area for the L5 medial branch is at the junction of the postero-lateral aspect of the superior articular process and the superior, posterior, and medial edge of the sacral ala. Under fluoroscopic guidance, a Quincke needle was inserted until contact was made with os over the superior postero-lateral aspect of the pedicular shadow (target area). After negative aspiration for blood, 0.5 mL of the nerve block solution was injected without difficulty or complication. The needle was removed intact. S1 Medial Branch Nerve Block (MBB): The target area for the S1 medial branch is at the posterior and inferior 6 o'clock position of the L5-S1 facet joint. Under fluoroscopic guidance, the Quincke needle inserted for the L5 MBB was redirected until  contact was made with os over the inferior and postero aspect of the sacrum, at the 6 o' clock position under the L5-S1  facet joint (Target area). After negative aspiration for blood, 0.5 mL of the nerve block solution was injected without difficulty or complication. The needle was removed intact.  Once the entire procedure was completed, the treated area was cleaned, making sure to leave some of the prepping solution back to take advantage of its long term bactericidal properties.         Illustration of the posterior view of the lumbar spine and the posterior neural structures. Laminae of L2 through S1 are labeled. DPRL5, dorsal primary ramus of L5; DPRS1, dorsal primary ramus of S1; DPR3, dorsal primary ramus of L3; FJ, facet (zygapophyseal) joint L3-L4; I, inferior articular process of L4; LB1, lateral branch of dorsal primary ramus of L1; IAB, inferior articular branches from L3 medial branch (supplies L4-L5 facet joint); IBP, intermediate branch plexus; MB3, medial branch of dorsal primary ramus of L3; NR3, third lumbar nerve root; S, superior articular process of L5; SAB, superior articular branches from L4 (supplies L4-5 facet joint also); TP3, transverse process of L3.   Facet Joint Innervation (* possible contribution)  L1-2 T12, L1 (L2*)  Medial Branch  L2-3 L1, L2 (L3*)         "          "  L3-4 L2, L3 (L4*)         "          "  L4-5 L3, L4 (L5*)         "          "  L5-S1 L4, L5, S1          "          "    There were no vitals filed for this visit.   End Time:   hrs.  Imaging Guidance (Spinal):          Type of Imaging Technique: Fluoroscopy Guidance (Spinal) Indication(s): Assistance in needle guidance and placement for procedures requiring needle placement in or near specific anatomical locations not easily accessible without such assistance. Exposure Time: Please see nurses notes. Contrast: None used. Fluoroscopic Guidance: I was personally present during the use of  fluoroscopy. "Tunnel Vision Technique" used to obtain the best possible view of the target area. Parallax error corrected before commencing the procedure. "Direction-depth-direction" technique used to introduce the needle under continuous pulsed fluoroscopy. Once target was reached, antero-posterior, oblique, and lateral fluoroscopic projection used confirm needle placement in all planes. Images permanently stored in EMR. Interpretation: No contrast injected. I personally interpreted the imaging intraoperatively. Adequate needle placement confirmed in multiple planes. Permanent images saved into the patient's record.  Post-operative Assessment:  Post-procedure Vital Signs:  Pulse/HCG Rate:    Temp:   Resp:   BP:   SpO2:    EBL: None  Complications: No immediate post-treatment complications observed by team, or reported by patient.  Note: The patient tolerated the entire procedure well. A repeat set of vitals were taken after the procedure and the patient was kept under observation following institutional policy, for this type of procedure. Post-procedural neurological assessment was performed, showing return to baseline, prior to discharge. The patient was provided with post-procedure discharge instructions, including a section on how to identify potential problems. Should any problems arise concerning this procedure, the patient was given instructions to immediately contact us, at any time, without hesitation. In any case, we plan to contact the patient by telephone for a follow-up status report regarding this interventional procedure.  Comments:  No  additional relevant information.  Plan of Care (POC)  Orders:  No orders of the defined types were placed in this encounter.  Chronic Opioid Analgesic:  None MME/day: 0 mg/day   Medications ordered for procedure: No orders of the defined types were placed in this encounter.  Medications administered: [name] A. Colglazier had no medications  administered during this visit.  See the medical record for exact dosing, route, and time of administration.  Follow-up plan:   No follow-ups on file.       Interventional Therapies  Risk Factors  Considerations  Medical Comorbidities:     Planned  Pending:   Diagnostic bilateral lumbar facet MBB #1  PT Dx X-rays Labwork   Under consideration:   Diagnostic bilateral lumbar facet MBB #1    Completed:   None at this time   Therapeutic  Palliative (PRN) options:   None established   Completed by other providers:   Therapeutic right L2, L3, L4, and L5 "lumbosacral SNRB" x1 (04/23/2015) by Dr. Ewing Schlein (N/A)       Recent Visits Date Type Provider Dept  06/15/23 Office Visit Delano Metz, MD [facility]  Showing recent visits within past 90 days and meeting all other requirements Future Appointments Date Type Provider Dept  07/14/23 Appointment Delano Metz, MD [facility]  Showing future appointments within next 90 days and meeting all other requirements  Disposition: Discharge home  Discharge (Date  Time): 07/14/2023;   hrs.   Primary Care Physician: Bethanie Dicker, NP Location: [facility] Note by: Oswaldo Done, MD (TTS technology used. I apologize for any typographical errors that were not detected and corrected.) Date: 07/14/2023; Time: 8:30 PM  Disclaimer:  Medicine is not an Visual merchandiser. The only guarantee in medicine is that nothing is guaranteed. It is important to note that the decision to proceed with this intervention was based on the information collected from the patient. The Data and conclusions were drawn from the patient's questionnaire, the interview, and the physical examination. Because the information was provided in large part by the patient, it cannot be guaranteed that it has not been purposely or unconsciously manipulated. Every effort has been made to obtain as much relevant  data as possible for this evaluation. It is important to note that the conclusions that lead to this procedure are derived in large part from the available data. Always take into account that the treatment will also be dependent on availability of resources and existing treatment guidelines, considered by other Pain Management Practitioners as being common knowledge and practice, at the time of the intervention. For Medico-Legal purposes, it is also important to point out that variation in procedural techniques and pharmacological choices are the acceptable norm. The indications, contraindications, technique, and results of the above procedure should only be interpreted and judged by a Board-Certified Interventional Pain Specialist with extensive familiarity and expertise in the same exact procedure and technique.

## 2023-07-13 NOTE — Telephone Encounter (Signed)
Patient's mother called and stated her daughter needs a referral to a Cardiologist in [place]. Her last cardiologist was in [place].

## 2023-07-13 NOTE — Telephone Encounter (Signed)
Pt mom called and I read the message to her and she verbalized understanding

## 2023-07-13 NOTE — Telephone Encounter (Signed)
Called and left a detailed VM informing them that the referral to cardiology has been placed and that once everything is sent over they would reach out to them once they are ready to schedule

## 2023-07-14 ENCOUNTER — Ambulatory Visit: Payer: 59 | Attending: Pain Medicine | Admitting: Pain Medicine

## 2023-07-14 ENCOUNTER — Encounter: Payer: Self-pay | Admitting: Pain Medicine

## 2023-07-14 ENCOUNTER — Ambulatory Visit: Admission: RE | Admit: 2023-07-14 | Payer: 59 | Source: Ambulatory Visit

## 2023-07-14 VITALS — BP 127/83 | HR 68 | Temp 97.4°F | Resp 18 | Ht 64.0 in | Wt 207.0 lb

## 2023-07-14 DIAGNOSIS — M5459 Other low back pain: Secondary | ICD-10-CM | POA: Insufficient documentation

## 2023-07-14 DIAGNOSIS — M545 Low back pain, unspecified: Secondary | ICD-10-CM | POA: Diagnosis not present

## 2023-07-14 DIAGNOSIS — M47817 Spondylosis without myelopathy or radiculopathy, lumbosacral region: Secondary | ICD-10-CM | POA: Insufficient documentation

## 2023-07-14 DIAGNOSIS — G8929 Other chronic pain: Secondary | ICD-10-CM | POA: Diagnosis present

## 2023-07-14 DIAGNOSIS — M47816 Spondylosis without myelopathy or radiculopathy, lumbar region: Secondary | ICD-10-CM | POA: Diagnosis present

## 2023-07-14 MED ORDER — TRIAMCINOLONE ACETONIDE 40 MG/ML IJ SUSP
80.0000 mg | Freq: Once | INTRAMUSCULAR | Status: AC
  Administered 2023-07-14: 80 mg

## 2023-07-14 MED ORDER — ROPIVACAINE HCL 2 MG/ML IJ SOLN
18.0000 mL | Freq: Once | INTRAMUSCULAR | Status: AC
  Administered 2023-07-14: 18 mL via PERINEURAL

## 2023-07-14 MED ORDER — LIDOCAINE HCL 2 % IJ SOLN
20.0000 mL | Freq: Once | INTRAMUSCULAR | Status: AC
  Administered 2023-07-14: 400 mg

## 2023-07-14 MED ORDER — FENTANYL CITRATE (PF) 100 MCG/2ML IJ SOLN
INTRAMUSCULAR | Status: AC
  Filled 2023-07-14: qty 2

## 2023-07-14 MED ORDER — TRIAMCINOLONE ACETONIDE 40 MG/ML IJ SUSP
INTRAMUSCULAR | Status: AC
  Filled 2023-07-14: qty 1

## 2023-07-14 MED ORDER — PENTAFLUOROPROP-TETRAFLUOROETH EX AERO
INHALATION_SPRAY | Freq: Once | CUTANEOUS | Status: AC
  Administered 2023-07-14: 30 via TOPICAL
  Filled 2023-07-14: qty 116

## 2023-07-14 MED ORDER — ROPIVACAINE HCL 2 MG/ML IJ SOLN
INTRAMUSCULAR | Status: AC
  Filled 2023-07-14: qty 20

## 2023-07-14 MED ORDER — MIDAZOLAM HCL 5 MG/5ML IJ SOLN
0.5000 mg | Freq: Once | INTRAMUSCULAR | Status: AC
  Administered 2023-07-14 (×3): 1 mg via INTRAVENOUS

## 2023-07-14 MED ORDER — LIDOCAINE HCL 2 % IJ SOLN
INTRAMUSCULAR | Status: AC
  Filled 2023-07-14: qty 20

## 2023-07-14 MED ORDER — MIDAZOLAM HCL 5 MG/5ML IJ SOLN
INTRAMUSCULAR | Status: AC
  Filled 2023-07-14: qty 5

## 2023-07-14 MED ORDER — LACTATED RINGERS IV SOLN: Freq: Once | INTRAVENOUS | Status: AC

## 2023-07-14 MED ORDER — FENTANYL CITRATE (PF) 100 MCG/2ML IJ SOLN: 25.0000 ug | INTRAMUSCULAR | Status: DC | PRN

## 2023-07-14 NOTE — Patient Instructions (Signed)

## 2023-07-14 NOTE — Progress Notes (Signed)
Safety precautions to be maintained throughout the outpatient stay will include: orient to surroundings, keep bed in low position, maintain call bell within reach at all times, provide assistance with transfer out of bed and ambulation.  

## 2023-07-15 ENCOUNTER — Other Ambulatory Visit (HOSPITAL_COMMUNITY)
Admission: RE | Admit: 2023-07-15 | Discharge: 2023-07-15 | Disposition: A | Discharge status: Home / Self Care | Payer: 59 | Source: Ambulatory Visit | Attending: Nurse Practitioner | Admitting: Nurse Practitioner

## 2023-07-15 ENCOUNTER — Telehealth: Payer: Self-pay

## 2023-07-15 ENCOUNTER — Ambulatory Visit (INDEPENDENT_AMBULATORY_CARE_PROVIDER_SITE_OTHER): Payer: 59 | Admitting: Nurse Practitioner

## 2023-07-15 VITALS — BP 108/60 | HR 77 | Temp 98.2°F | Ht 64.0 in | Wt 204.6 lb

## 2023-07-15 DIAGNOSIS — N760 Acute vaginitis: Secondary | ICD-10-CM

## 2023-07-15 LAB — POC URINALSYSI DIPSTICK (AUTOMATED)
Bilirubin, UA: NEGATIVE
Glucose, UA: NEGATIVE
Ketones, UA: NEGATIVE
Nitrite, UA: NEGATIVE
Protein, UA: NEGATIVE
Spec Grav, UA: 1.01 (ref 1.010–1.025)
Urobilinogen, UA: 0.2 E.U./dL
pH, UA: 6 (ref 5.0–8.0)

## 2023-07-15 MED ORDER — FLUCONAZOLE 150 MG PO TABS
ORAL_TABLET | ORAL | 0 refills | Status: DC
Start: 2023-07-15 — End: 2023-07-29

## 2023-07-15 NOTE — Progress Notes (Signed)
Jamie Dicker, NP-C Phone: [phone]  Jamie Ross is a 37 y.o. female who presents today for vaginitis.   Vaginitis: Patient complains of an abnormal vaginal discharge for 5 days. Vaginal symptoms include burning, discharge described as white and creamy, odor, and vulvar itching.Vulvar symptoms include burning, local irritation, odor, and vulvar itching.STI Risk: Very low risk of STD exposureDischarge described as: white, yellow, thin, and malodorous.Other associated symptoms: none.Menstrual pattern: She had been bleeding Hx ablation. Contraception: none  Social History   Tobacco Use  Smoking Status Light Smoker   Current packs/day: 0.25   Average packs/day: 0.3 packs/day for 0.5 years (0.1 ttl pk-yrs)   Types: Cigarettes  Smokeless Tobacco Never    Current Outpatient Medications on File Prior to Visit  Medication Sig Dispense Refill   topiramate (TOPAMAX) 50 MG tablet Take 50 mg by mouth at bedtime.     acetaminophen (TYLENOL) 650 MG CR tablet Take 1,300 mg by mouth every 8 (eight) hours as needed.     albuterol (PROAIR HFA) 108 (90 Base) MCG/ACT inhaler Inhale 2 puffs into the lungs every 6 (six) hours as needed for wheezing or shortness of breath. 8 g 5   amantadine (SYMMETREL) 100 MG capsule Take 100 mg by mouth 2 (two) times daily.     benztropine (COGENTIN) 0.5 MG tablet Take 1 tablet (0.5 mg total) by mouth 2 (two) times daily. 180 tablet 0   budesonide-formoterol (SYMBICORT) 160-4.5 MCG/ACT inhaler Inhale 2 puffs into the lungs 2 (two) times daily. 1 Inhaler 0   cetirizine (ZYRTEC) 10 MG tablet Take 1 tablet (10 mg total) by mouth daily. 30 tablet 11   fluticasone (FLONASE) 50 MCG/ACT nasal spray 1 spray into each nostril daily. 15.8 mL 3   haloperidol (HALDOL) 5 MG tablet Take 0.5 tablets (2.5 mg total) by mouth 2 (two) times daily. 30 tablet 0   LINZESS 72 MCG capsule Take 72 mcg by mouth daily.     LYBALVI 10-10 MG TABS Take 1 tablet by mouth at bedtime. 30 tablet 0    meloxicam (MOBIC) 15 MG tablet Take 1 tablet (15 mg total) by mouth daily. 90 tablet 3   montelukast (SINGULAIR) 10 MG tablet Take 1 tablet (10 mg total) by mouth at bedtime. 30 tablet 0   nystatin cream (MYCOSTATIN) Apply 1 Application topically 2 (two) times daily.     omeprazole (PRILOSEC) 40 MG capsule Take 40 mg by mouth every morning.     ondansetron (ZOFRAN-ODT) 4 MG disintegrating tablet Take 1 tablet (4 mg total) by mouth every 8 (eight) hours as needed for nausea or vomiting. 30 tablet 5   prazosin (MINIPRESS) 2 MG capsule Take 1 capsule (2 mg total) by mouth 2 (two) times daily. 60 capsule 0   propranolol (INDERAL) 10 MG tablet Take 10 mg by mouth 2 (two) times daily.     sertraline (ZOLOFT) 50 MG tablet Take 50 mg by mouth daily.     [DISCONTINUED] hydrochlorothiazide (MICROZIDE) 12.5 MG capsule Take 12.5 mg by mouth daily.     [DISCONTINUED] potassium chloride SA (K-DUR,KLOR-CON) 20 MEQ tablet Take 20 mEq by mouth daily.     No current facility-administered medications on file prior to visit.     ROS see history of present illness  Objective  Physical Exam Vitals:   07/15/23 0819  BP: 108/60  Pulse: 77  Temp: 98.2 F (36.8 C)  SpO2: 96%    BP Readings from Last 3 Encounters:  07/15/23 108/60  07/14/23 127/83  06/15/23 122/88   Wt Readings from Last 3 Encounters:  07/15/23 204 lb 9.6 oz (92.8 kg)  07/14/23 207 lb (93.9 kg)  06/15/23 217 lb (98.4 kg)    Physical Exam Constitutional:      General: She is not in acute distress.    Appearance: Normal appearance.  HENT:     Head: Normocephalic.  Cardiovascular:     Rate and Rhythm: Normal rate and regular rhythm.     Heart sounds: Normal heart sounds.  Pulmonary:     Effort: Pulmonary effort is normal.     Breath sounds: Normal breath sounds.  Skin:    General: Skin is warm and dry.  Neurological:     General: No focal deficit present.     Mental Status: She is alert.  Psychiatric:        Mood and  Affect: Mood normal.        Behavior: Behavior normal.    Assessment/Plan: Please see individual problem list.  Acute vaginitis Assessment & Plan: Symptoms consistent with yeast infection. Patient declined pelvic exam today. Vaginal swab obtained, patient self-swabbed. Will treat with Diflucan 150 mg x 1 dose, may repeat in 72 hours if needed. Will contact patient with results of swab. UA in office with trace leukocytes and trace lysed blood. Will continue to monitor. Encouraged to contact if worsening or changing symptoms.   Orders: -     POCT Urinalysis Dipstick (Automated) -     Cervicovaginal ancillary only -     Fluconazole; Take 1 tablet by mouth x 1 dose. May repeat in 72 hours if needed.  Dispense: 2 tablet; Refill: 0   Return if symptoms worsen or fail to improve.   Jamie Dicker, NP-C [facility] [employer]

## 2023-07-15 NOTE — Assessment & Plan Note (Signed)
Symptoms consistent with yeast infection. Patient declined pelvic exam today. Vaginal swab obtained, patient self-swabbed. Will treat with Diflucan 150 mg x 1 dose, may repeat in 72 hours if needed. Will contact patient with results of swab. UA in office with trace leukocytes and trace lysed blood. Will continue to monitor. Encouraged to contact if worsening or changing symptoms.

## 2023-07-15 NOTE — Telephone Encounter (Signed)
Post procedure follow up.  Family states she had a terrible night.  States that 15 minutes after she got home, she was crying in pain.  States the pain is the same as it was before inijection.  Denies fever, weakness or difficulty with bowel/bladder.  Instructed that they would need to give the steroid time to start working. Informed them to call us for any other symptoms like fever, progressive weakness...  Dr Laban Emperor notified.

## 2023-07-16 ENCOUNTER — Telehealth: Payer: Self-pay

## 2023-07-16 ENCOUNTER — Ambulatory Visit: Payer: 59 | Attending: Pain Medicine | Admitting: Pain Medicine

## 2023-07-16 ENCOUNTER — Encounter: Payer: Self-pay | Admitting: Pain Medicine

## 2023-07-16 ENCOUNTER — Telehealth: Payer: Self-pay | Admitting: Pain Medicine

## 2023-07-16 VITALS — BP 118/87 | HR 71 | Temp 97.1°F | Ht 64.0 in | Wt 204.0 lb

## 2023-07-16 DIAGNOSIS — G8918 Other acute postprocedural pain: Secondary | ICD-10-CM | POA: Insufficient documentation

## 2023-07-16 DIAGNOSIS — M5417 Radiculopathy, lumbosacral region: Secondary | ICD-10-CM | POA: Diagnosis present

## 2023-07-16 DIAGNOSIS — G8929 Other chronic pain: Secondary | ICD-10-CM | POA: Diagnosis present

## 2023-07-16 DIAGNOSIS — M545 Low back pain, unspecified: Secondary | ICD-10-CM | POA: Diagnosis present

## 2023-07-16 DIAGNOSIS — M79604 Pain in right leg: Secondary | ICD-10-CM | POA: Diagnosis present

## 2023-07-16 DIAGNOSIS — G4701 Insomnia due to medical condition: Secondary | ICD-10-CM | POA: Insufficient documentation

## 2023-07-16 DIAGNOSIS — M5459 Other low back pain: Secondary | ICD-10-CM | POA: Diagnosis present

## 2023-07-16 MED ORDER — PREDNISONE 20 MG PO TABS
ORAL_TABLET | ORAL | 0 refills | Status: AC
Start: 2023-07-16 — End: 2023-07-25

## 2023-07-16 MED ORDER — KETOROLAC TROMETHAMINE 60 MG/2ML IM SOLN
60.0000 mg | Freq: Once | INTRAMUSCULAR | Status: AC
  Administered 2023-07-16: 60 mg via INTRAMUSCULAR

## 2023-07-16 MED ORDER — METHOCARBAMOL 1000 MG/10ML IJ SOLN
200.0000 mg | Freq: Once | INTRAMUSCULAR | Status: AC
  Administered 2023-07-16: 200 mg via INTRAMUSCULAR

## 2023-07-16 MED ORDER — CYCLOBENZAPRINE HCL 10 MG PO TABS
10.0000 mg | ORAL_TABLET | Freq: Three times a day (TID) | ORAL | 0 refills | Status: DC | PRN
Start: 2023-07-16 — End: 2023-10-28

## 2023-07-16 MED ORDER — KETOROLAC TROMETHAMINE 60 MG/2ML IM SOLN
INTRAMUSCULAR | Status: AC
  Filled 2023-07-16: qty 2

## 2023-07-16 MED ORDER — METHOCARBAMOL 1000 MG/10ML IJ SOLN
INTRAMUSCULAR | Status: AC
  Filled 2023-07-16: qty 10

## 2023-07-16 NOTE — Progress Notes (Signed)
Safety precautions to be maintained throughout the outpatient stay will include: orient to surroundings, keep bed in low position, maintain call bell within reach at all times, provide assistance with transfer out of bed and ambulation.  
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
## 2023-07-16 NOTE — Telephone Encounter (Signed)
Called patients father and asked himt ob ring the patinet in for a check. States he will.

## 2023-07-16 NOTE — Telephone Encounter (Signed)
PT father called states that he spoke with nurse on yesterday and was told that someone will be giving him a call. Father states that patient is still in pain since Tuesday and wants to know what can be done. Please give patient father a call. TY

## 2023-07-16 NOTE — Telephone Encounter (Signed)
Spoke with Dr Laban Emperor yesterday and there is nothing at this point to do except wait and see if the steroid gives any relief.  It has been 2 days since the procedure and the patients father was explained that they needed to give the steroid time to start working.  He states that she can't sleep at night and the pain is worse.  States he did the ice/ heat.  Denies fever or any weakness.  Will notify Dr Laban Emperor.  Told father I would only call him back if there was any action that we weould need to take.

## 2023-07-16 NOTE — Patient Instructions (Signed)
____________________________________________________________________________________________  Post-Procedure Discharge Instructions  Instructions: Apply ice:  Purpose: This will minimize any swelling and discomfort after procedure.  When: Day of procedure, as soon as you get home. How: Fill a plastic sandwich bag with crushed ice. Cover it with a small towel and apply to injection site. How long: (15 min on, 15 min off) Apply for 15 minutes then remove x 15 minutes.  Repeat sequence on day of procedure, until you go to bed. Apply heat:  Purpose: To treat any soreness and discomfort from the procedure. When: Starting the next day after the procedure. How: Apply heat to procedure site starting the day following the procedure. How long: May continue to repeat daily, until discomfort goes away. Food intake: Start with clear liquids (like water) and advance to regular food, as tolerated.  Physical activities: Keep activities to a minimum for the first 8 hours after the procedure. After that, then as tolerated. Driving: If you have received any sedation, be responsible and do not drive. You are not allowed to drive for 24 hours after having sedation. Blood thinner: (Applies only to those taking blood thinners) You may restart your blood thinner 6 hours after your procedure. Insulin: (Applies only to Diabetic patients taking insulin) As soon as you can eat, you may resume your normal dosing schedule. Infection prevention: Keep procedure site clean and dry. Shower daily and clean area with soap and water. Post-procedure Pain Diary: Extremely important that this be done correctly and accurately. Recorded information will be used to determine the next step in treatment. For the purpose of accuracy, follow these rules: Evaluate only the area treated. Do not report or include pain from an untreated area. For the purpose of this evaluation, ignore all other areas of pain, except for the treated  area. After your procedure, avoid taking a long nap and attempting to complete the pain diary after you wake up. Instead, set your alarm clock to go off every hour, on the hour, for the initial 8 hours after the procedure. Document the duration of the numbing medicine, and the relief you are getting from it. Do not go to sleep and attempt to complete it later. It will not be accurate. If you received sedation, it is likely that you were given a medication that may cause amnesia. Because of this, completing the diary at a later time may cause the information to be inaccurate. This information is needed to plan your care. Follow-up appointment: Keep your post-procedure follow-up evaluation appointment after the procedure (usually 2 weeks for most procedures, 6 weeks for radiofrequencies). DO NOT FORGET to bring you pain diary with you.   Expect: (What should I expect to see with my procedure?) From numbing medicine (AKA: Local Anesthetics): Numbness or decrease in pain. You may also experience some weakness, which if present, could last for the duration of the local anesthetic. Onset: Full effect within 15 minutes of injected. Duration: It will depend on the type of local anesthetic used. On the average, 1 to 8 hours.  From steroids (Applies only if steroids were used): Decrease in swelling or inflammation. Once inflammation is improved, relief of the pain will follow. Onset of benefits: Depends on the amount of swelling present. The more swelling, the longer it will take for the benefits to be seen. In some cases, up to 10 days. Duration: Steroids will stay in the system x 2 weeks. Duration of benefits will depend on multiple posibilities including persistent irritating factors. Side-effects: If present, they  may typically last 2 weeks (the duration of the steroids). Frequent: Cramps (if they occur, drink Gatorade and take over-the-counter Magnesium 450-500 mg once to twice a day); water retention with  temporary weight gain; increases in blood sugar; decreased immune system response; increased appetite. Occasional: Facial flushing (red, warm cheeks); mood swings; menstrual changes. Uncommon: Long-term decrease or suppression of natural hormones; bone thinning. (These are more common with higher doses or more frequent use. This is why we prefer that our patients avoid having any injection therapies in other practices.)  Very Rare: Severe mood changes; psychosis; aseptic necrosis. From procedure: Some discomfort is to be expected once the numbing medicine wears off. This should be minimal if ice and heat are applied as instructed.  Call if: (When should I call?) You experience numbness and weakness that gets worse with time, as opposed to wearing off. New onset bowel or bladder incontinence. (Applies only to procedures done in the spine)  Emergency Numbers: Durning business hours (Monday - Thursday, 8:00 AM - 4:00 PM) (Friday, 9:00 AM - 12:00 Noon): [phone] After hours: [phone] NOTE: If you are having a problem and are unable connect with, or to talk to a provider, then go to your nearest urgent care or emergency department. If the problem is serious and urgent, please call 911. ____________________________________________________________________________________________    ____________________________________________________________________________________________  Muscle Spasms & Cramps  Cause(s):  Most common - vitamin and/or electrolyte (calcium, potassium, sodium, etc.) deficiencies. Post procedure - steroids (injected, oral, or inhaled) can make your kidneys excrete (loose) electrolytes. Most of the time this will not cause any symptoms however, if you happen to be borderline low on your electrolytes, it may temporarily triggering cramps & spasms.  Possible triggers: Sweating - causes loss of electrolytes thru the skin. Steroids - causes loss of electrolytes thru the  urine.  Treatment: (over-the-counter)  Gatorade (or any other electrolyte-replenishing drink) - Take 1, 8 oz glass with each meal (3 times a day). Mechanism of action: Replenishes lost electrolytes. Magnesium 400 to 500 mg - Take 1 tablet twice a day (one with breakfast and one at bedtime). If you have kidney disease talk to your primary care physician before taking any Magnesium. Mechanism of action: Magnesium is a natural muscle relaxant. Tonic Water with quinine - Take 1, 8 oz glass before bedtime.  Mechanism of action: Quinine is used to treat spasms.  Last Update: 06/18/2023  ____________________________________________________________________________________________

## 2023-07-17 ENCOUNTER — Telehealth: Payer: Self-pay | Admitting: Nurse Practitioner

## 2023-07-17 ENCOUNTER — Telehealth: Payer: Self-pay

## 2023-07-17 NOTE — Telephone Encounter (Signed)
LVM for labs

## 2023-07-17 NOTE — Telephone Encounter (Signed)
Patient's mother returned office phone call.

## 2023-07-23 ENCOUNTER — Ambulatory Visit: Payer: 59
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
## 2023-07-28 ENCOUNTER — Encounter: Payer: Self-pay | Admitting: Pain Medicine

## 2023-07-28 ENCOUNTER — Ambulatory Visit (HOSPITAL_BASED_OUTPATIENT_CLINIC_OR_DEPARTMENT_OTHER): Payer: 59 | Admitting: Pain Medicine

## 2023-07-28 ENCOUNTER — Ambulatory Visit
Admission: RE | Admit: 2023-07-28 | Discharge: 2023-07-28 | Disposition: A | Discharge status: Home / Self Care | Payer: 59 | Source: Ambulatory Visit | Attending: Pain Medicine | Admitting: Pain Medicine

## 2023-07-28 VITALS — BP 141/84 | HR 110 | Temp 98.1°F | Resp 16 | Ht 64.0 in | Wt 201.0 lb

## 2023-07-28 DIAGNOSIS — M545 Low back pain, unspecified: Secondary | ICD-10-CM | POA: Diagnosis present

## 2023-07-28 DIAGNOSIS — M5137 Other intervertebral disc degeneration, lumbosacral region: Secondary | ICD-10-CM

## 2023-07-28 DIAGNOSIS — M47816 Spondylosis without myelopathy or radiculopathy, lumbar region: Secondary | ICD-10-CM | POA: Diagnosis present

## 2023-07-28 DIAGNOSIS — M5417 Radiculopathy, lumbosacral region: Secondary | ICD-10-CM | POA: Insufficient documentation

## 2023-07-28 DIAGNOSIS — G8918 Other acute postprocedural pain: Secondary | ICD-10-CM | POA: Insufficient documentation

## 2023-07-28 DIAGNOSIS — G8929 Other chronic pain: Secondary | ICD-10-CM | POA: Diagnosis present

## 2023-07-28 DIAGNOSIS — M79604 Pain in right leg: Secondary | ICD-10-CM | POA: Diagnosis present

## 2023-07-28 DIAGNOSIS — Z09 Encounter for follow-up examination after completed treatment for conditions other than malignant neoplasm: Secondary | ICD-10-CM

## 2023-07-28 DIAGNOSIS — M5459 Other low back pain: Secondary | ICD-10-CM | POA: Diagnosis present

## 2023-07-28 DIAGNOSIS — M51379 Other intervertebral disc degeneration, lumbosacral region without mention of lumbar back pain or lower extremity pain: Secondary | ICD-10-CM

## 2023-07-28 NOTE — Patient Instructions (Addendum)

## 2023-08-03 DIAGNOSIS — R937 Abnormal findings on diagnostic imaging of other parts of musculoskeletal system: Secondary | ICD-10-CM | POA: Insufficient documentation

## 2023-08-03 NOTE — Progress Notes (Unsigned)
PROVIDER NOTE: Interpretation of information contained herein should be left to medically-trained personnel. Specific patient instructions are provided elsewhere under "Patient Instructions" section of medical record. This document was created in part using STT-dictation technology, any transcriptional errors that may result from this process are unintentional.  Patient: Jamie Ross Type: Established DOB: 1986-02-03 MRN: [id number] PCP: Bethanie Dicker, NP  Service: Procedure DOS: 08/04/2023 Setting: Ambulatory Location: Ambulatory outpatient facility Delivery: Face-to-face Provider: Oswaldo Done, MD Specialty: Interventional Pain Management Specialty designation: 09 Location: Outpatient facility Ref. Prov.: Bethanie Dicker, NP       Interventional Therapy   Procedure: Lumbar epidural steroid injection (LESI) (interlaminar) #1    Laterality: Right   Level:  L4-5 Level.  Imaging: Fluoroscopic guidance         Anesthesia: Local anesthesia (1-2% Lidocaine) Anxiolysis: IV Versed         Sedation:                         DOS: 08/04/2023  Performed by: Oswaldo Done, MD  Purpose: Diagnostic/Therapeutic Indications: Lumbar radicular pain of intraspinal etiology of more than 4 weeks that has failed to respond to conservative therapy and is severe enough to impact quality of life or function. 1. Chronic low back pain (1ry area of Pain) (Bilateral) w/o sciatica   2. Chronic lower extremity pain (2ry area of Pain) (Right)   3. DDD (degenerative disc disease), lumbosacral   4. Lumbosacral radiculopathy (Right)   5. Lumbosacral radiculopathy at L5 (Right)   6. Abnormal MRI, lumbar spine (07/28/2023)   7. Developmental delay    NAS-11 Pain score:   Pre-procedure:  /10   Post-procedure:  /10      Position / Prep / Materials:  Position: Prone w/ head of the table raised (slight reverse trendelenburg) to facilitate breathing.  Prep solution: DuraPrep (Iodine Povacrylex [0.7%  available iodine] and Isopropyl Alcohol, 74% w/w) Prep Area: Entire Posterior Lumbar Region from lower scapular tip down to mid buttocks area and from flank to flank. Materials:  Tray: Epidural tray Needle(s):  Type: Epidural needle (Tuohy) Gauge (G):  17 Length: Regular (3.5-in) Qty: 1   H&P (Pre-op Assessment):  Jamie Ross is a 37 y.o. (year old), female patient, seen today for interventional treatment. She  has a past surgical history that includes Cholecystectomy; Tonsillectomy; Wisdom tooth extraction; Endometrial ablation; and Umbilical hernia repair. Jamie Ross has a current medication list which includes the following prescription(s): acetaminophen, albuterol, amantadine, budesonide-formoterol, cetirizine, cyclobenzaprine, fluticasone, haloperidol, linzess, meloxicam, omeprazole, ondansetron, prazosin, propranolol, sertraline, topiramate, [DISCONTINUED] hydrochlorothiazide, and [DISCONTINUED] potassium chloride sa. Her primarily concern today is the No chief complaint on file.  Initial Vital Signs:  Pulse/HCG Rate:    Temp:   Resp:   BP:   SpO2:    BMI: Estimated body mass index is 34.5 kg/m as calculated from the following:   Height as of 07/28/23: 5\' 4"  (1.626 m).   Weight as of 07/28/23: 201 lb (91.2 kg).  Risk Assessment: Allergies: Reviewed. She is allergic to doxycycline, medroxyprogesterone, methylprednisolone, penicillins, risperidone, risperidone and related, and septra [sulfamethoxazole-trimethoprim].  Allergy Precautions: None required Coagulopathies: Reviewed. None identified.  Blood-thinner therapy: None at this time Active Infection(s): Reviewed. None identified. Jamie Ross is afebrile  Site Confirmation: Jamie Ross was asked to confirm the procedure and laterality before marking the site Procedure checklist: Completed Consent: Before the procedure and under the influence of no sedative(s), amnesic(s), or anxiolytics, the patient was informed  of the  treatment options, risks and possible complications. To fulfill our ethical and legal obligations, as recommended by the American Medical Association's Code of Ethics, I have informed the patient of my clinical impression; the nature and purpose of the treatment or procedure; the risks, benefits, and possible complications of the intervention; the alternatives, including doing nothing; the risk(s) and benefit(s) of the alternative treatment(s) or procedure(s); and the risk(s) and benefit(s) of doing nothing. The patient was provided information about the general risks and possible complications associated with the procedure. These may include, but are not limited to: failure to achieve desired goals, infection, bleeding, organ or nerve damage, allergic reactions, paralysis, and death. In addition, the patient was informed of those risks and complications associated to Spine-related procedures, such as failure to decrease pain; infection (i.e.: Meningitis, epidural or intraspinal abscess); bleeding (i.e.: epidural hematoma, subarachnoid hemorrhage, or any other type of intraspinal or peri-dural bleeding); organ or nerve damage (i.e.: Any type of peripheral nerve, nerve root, or spinal cord injury) with subsequent damage to sensory, motor, and/or autonomic systems, resulting in permanent pain, numbness, and/or weakness of one or several areas of the body; allergic reactions; (i.e.: anaphylactic reaction); and/or death. Furthermore, the patient was informed of those risks and complications associated with the medications. These include, but are not limited to: allergic reactions (i.e.: anaphylactic or anaphylactoid reaction(s)); adrenal axis suppression; blood sugar elevation that in diabetics may result in ketoacidosis or comma; water retention that in patients with history of congestive heart failure may result in shortness of breath, pulmonary edema, and decompensation with resultant heart failure; weight gain;  swelling or edema; medication-induced neural toxicity; particulate matter embolism and blood vessel occlusion with resultant organ, and/or nervous system infarction; and/or aseptic necrosis of one or more joints. Finally, the patient was informed that Medicine is not an exact science; therefore, there is also the possibility of unforeseen or unpredictable risks and/or possible complications that may result in a catastrophic outcome. The patient indicated having understood very clearly. We have given the patient no guarantees and we have made no promises. Enough time was given to the patient to ask questions, all of which were answered to the patient's satisfaction. Ms. Corner has indicated that she wanted to continue with the procedure. Attestation: I, the ordering provider, attest that I have discussed with the patient the benefits, risks, side-effects, alternatives, likelihood of achieving goals, and potential problems during recovery for the procedure that I have provided informed consent. Date  Time: {CHL [facility]IN TIME CHOICES:[id number]}   Pre-Procedure Preparation:  Monitoring: As per clinic protocol. Respiration, ETCO2, SpO2, BP, heart rate and rhythm monitor placed and checked for adequate function Safety Precautions: Patient was assessed for positional comfort and pressure points before starting the procedure. Time-out: I initiated and conducted the "Time-out" before starting the procedure, as per protocol. The patient was asked to participate by confirming the accuracy of the "Time Out" information. Verification of the correct person, site, and procedure were performed and confirmed by me, the nursing staff, and the patient. "Time-out" conducted as per Joint Commission's Universal Protocol (UP.01.01.01). Time:   Start Time:   hrs.  Description/Narrative of Procedure:          Target: Epidural space via interlaminar opening, initially targeting the lower laminar border of the superior  vertebral body. Region: Lumbar Approach: Percutaneous paravertebral  Rationale (medical necessity): procedure needed and proper for the diagnosis and/or treatment of the patient's medical symptoms and needs. Procedural Technique Safety Precautions: Aspiration looking  for blood return was conducted prior to all injections. At no point did we inject any substances, as a needle was being advanced. No attempts were made at seeking any paresthesias. Safe injection practices and needle disposal techniques used. Medications properly checked for expiration dates. SDV (single dose vial) medications used. Description of the Procedure: Protocol guidelines were followed. The procedure needle was introduced through the skin, ipsilateral to the reported pain, and advanced to the target area. Bone was contacted and the needle walked caudad, until the lamina was cleared. The epidural space was identified using "loss-of-resistance technique" with 2-3 ml of PF-NaCl (0.9% NSS), in a 5cc LOR glass syringe.  There were no vitals filed for this visit.  Start Time:   hrs. End Time:   hrs.  Imaging Guidance (Spinal):          Type of Imaging Technique: Fluoroscopy Guidance (Spinal) Indication(s): Assistance in needle guidance and placement for procedures requiring needle placement in or near specific anatomical locations not easily accessible without such assistance. Exposure Time: Please see nurses notes. Contrast: Before injecting any contrast, we confirmed that the patient did not have an allergy to iodine, shellfish, or radiological contrast. Once satisfactory needle placement was completed at the desired level, radiological contrast was injected. Contrast injected under live fluoroscopy. No contrast complications. See chart for type and volume of contrast used. Fluoroscopic Guidance: I was personally present during the use of fluoroscopy. "Tunnel Vision Technique" used to obtain the best possible view of the target  area. Parallax error corrected before commencing the procedure. "Direction-depth-direction" technique used to introduce the needle under continuous pulsed fluoroscopy. Once target was reached, antero-posterior, oblique, and lateral fluoroscopic projection used confirm needle placement in all planes. Images permanently stored in EMR. Interpretation: I personally interpreted the imaging intraoperatively. Adequate needle placement confirmed in multiple planes. Appropriate spread of contrast into desired area was observed. No evidence of afferent or efferent intravascular uptake. No intrathecal or subarachnoid spread observed. Permanent images saved into the patient's record.  Antibiotic Prophylaxis:   Anti-infectives (From admission, onward)    None      Indication(s): None identified  Post-operative Assessment:  Post-procedure Vital Signs:  Pulse/HCG Rate:    Temp:   Resp:   BP:   SpO2:    EBL: None  Complications: No immediate post-treatment complications observed by team, or reported by patient.  Note: The patient tolerated the entire procedure well. A repeat set of vitals were taken after the procedure and the patient was kept under observation following institutional policy, for this type of procedure. Post-procedural neurological assessment was performed, showing return to baseline, prior to discharge. The patient was provided with post-procedure discharge instructions, including a section on how to identify potential problems. Should any problems arise concerning this procedure, the patient was given instructions to immediately contact us, at any time, without hesitation. In any case, we plan to contact the patient by telephone for a follow-up status report regarding this interventional procedure.  Comments:  No additional relevant information.  Plan of Care (POC)  Orders:  No orders of the defined types were placed in this encounter.  Chronic Opioid Analgesic:  No chronic opioid  analgesics therapy prescribed by our practice. None MME/day: 0 mg/day   Medications ordered for procedure: No orders of the defined types were placed in this encounter.  Medications administered: [name] A. [place] had no medications administered during this visit.  See the medical record for exact dosing, route, and time of administration.  Follow-up  plan:   No follow-ups on file.       Interventional Therapies  Risk Factors  Considerations  Medical Comorbidities:       Planned  Pending:   PT Dx X-rays Labwork   Under consideration:   Diagnostic bilateral lumbar facet MBB #2    Completed:   Diagnostic bilateral lumbar facet (L2-S1) MBB x1    Therapeutic  Palliative (PRN) options:   None established   Completed by other providers:   Therapeutic right L2, L3, L4, and L5 "lumbosacral SNRB" x1 (04/23/2015) by Dr. Ewing Schlein (N/A)        Recent Visits Date Type Provider Dept  07/28/23 Office Visit Delano Metz, MD [facility]  07/16/23 Office Visit Delano Metz, MD [facility]  07/14/23 Procedure visit Delano Metz, MD [facility]  06/15/23 Office Visit Delano Metz, MD [facility]  Showing recent visits within past 90 days and meeting all other requirements Future Appointments Date Type Provider Dept  08/04/23 Appointment Delano Metz, MD [facility]  Showing future appointments within next 90 days and meeting all other requirements  Disposition: Discharge home  Discharge (Date  Time): 08/04/2023;   hrs.   Primary Care Physician: Bethanie Dicker, NP Location: [facility] Note by: Oswaldo Done, MD (TTS technology used. I apologize for any typographical errors that were not detected and corrected.) Date: 08/04/2023; Time: 9:45 PM  Disclaimer:  Medicine is not an Visual merchandiser. The only guarantee in medicine is that nothing is guaranteed. It  is important to note that the decision to proceed with this intervention was based on the information collected from the patient. The Data and conclusions were drawn from the patient's questionnaire, the interview, and the physical examination. Because the information was provided in large part by the patient, it cannot be guaranteed that it has not been purposely or unconsciously manipulated. Every effort has been made to obtain as much relevant data as possible for this evaluation. It is important to note that the conclusions that lead to this procedure are derived in large part from the available data. Always take into account that the treatment will also be dependent on availability of resources and existing treatment guidelines, considered by other Pain Management Practitioners as being common knowledge and practice, at the time of the intervention. For Medico-Legal purposes, it is also important to point out that variation in procedural techniques and pharmacological choices are the acceptable norm. The indications, contraindications, technique, and results of the above procedure should only be interpreted and judged by a Board-Certified Interventional Pain Specialist with extensive familiarity and expertise in the same exact procedure and technique.

## 2023-08-04 ENCOUNTER — Ambulatory Visit: Admission: RE | Admit: 2023-08-04 | Payer: 59 | Source: Ambulatory Visit

## 2023-08-04 ENCOUNTER — Encounter: Payer: Self-pay | Admitting: Pain Medicine

## 2023-08-04 ENCOUNTER — Ambulatory Visit: Payer: 59 | Attending: Pain Medicine | Admitting: Pain Medicine

## 2023-08-04 VITALS — BP 128/94 | HR 97 | Temp 97.8°F | Resp 15 | Ht 64.0 in | Wt 201.0 lb

## 2023-08-04 DIAGNOSIS — M5417 Radiculopathy, lumbosacral region: Secondary | ICD-10-CM

## 2023-08-04 DIAGNOSIS — R6250 Unspecified lack of expected normal physiological development in childhood: Secondary | ICD-10-CM | POA: Diagnosis present

## 2023-08-04 DIAGNOSIS — R937 Abnormal findings on diagnostic imaging of other parts of musculoskeletal system: Secondary | ICD-10-CM

## 2023-08-04 DIAGNOSIS — M79604 Pain in right leg: Secondary | ICD-10-CM | POA: Insufficient documentation

## 2023-08-04 DIAGNOSIS — M51379 Other intervertebral disc degeneration, lumbosacral region without mention of lumbar back pain or lower extremity pain: Secondary | ICD-10-CM

## 2023-08-04 DIAGNOSIS — M5137 Other intervertebral disc degeneration, lumbosacral region: Secondary | ICD-10-CM | POA: Insufficient documentation

## 2023-08-04 DIAGNOSIS — G8929 Other chronic pain: Secondary | ICD-10-CM

## 2023-08-04 DIAGNOSIS — M545 Low back pain, unspecified: Secondary | ICD-10-CM | POA: Insufficient documentation

## 2023-08-04 MED ORDER — IOHEXOL 180 MG/ML  SOLN
10.0000 mL | Freq: Once | INTRAMUSCULAR | Status: AC
  Administered 2023-08-04: 10 mL via EPIDURAL
  Filled 2023-08-04: qty 20

## 2023-08-04 MED ORDER — MIDAZOLAM HCL 2 MG/2ML IJ SOLN: 0.5000 mg | Freq: Once | INTRAMUSCULAR | Status: DC

## 2023-08-04 MED ORDER — ROPIVACAINE HCL 2 MG/ML IJ SOLN
2.0000 mL | Freq: Once | INTRAMUSCULAR | Status: AC
  Administered 2023-08-04: 2 mL via EPIDURAL
  Filled 2023-08-04: qty 20

## 2023-08-04 MED ORDER — PENTAFLUOROPROP-TETRAFLUOROETH EX AERO
INHALATION_SPRAY | Freq: Once | CUTANEOUS | Status: AC
  Administered 2023-08-04: 30 via TOPICAL
  Filled 2023-08-04: qty 30

## 2023-08-04 MED ORDER — MIDAZOLAM HCL 2 MG/2ML IJ SOLN
2.0000 mg | Freq: Once | INTRAMUSCULAR | Status: AC
  Administered 2023-08-04: 2 mg via INTRAMUSCULAR

## 2023-08-04 MED ORDER — SODIUM CHLORIDE 0.9% FLUSH
2.0000 mL | Freq: Once | INTRAVENOUS | Status: AC
  Administered 2023-08-04: 2 mL

## 2023-08-04 MED ORDER — LIDOCAINE HCL 2 % IJ SOLN
20.0000 mL | Freq: Once | INTRAMUSCULAR | Status: AC
  Administered 2023-08-04: 100 mg
  Filled 2023-08-04: qty 40

## 2023-08-04 MED ORDER — TRIAMCINOLONE ACETONIDE 40 MG/ML IJ SUSP
40.0000 mg | Freq: Once | INTRAMUSCULAR | Status: AC
  Administered 2023-08-04: 40 mg
  Filled 2023-08-04: qty 1

## 2023-08-04 MED ORDER — LACTATED RINGERS IV SOLN: Freq: Once | INTRAVENOUS | Status: DC

## 2023-08-04 MED ORDER — MIDAZOLAM HCL 2 MG/2ML IJ SOLN
INTRAMUSCULAR | Status: AC
  Filled 2023-08-04: qty 2

## 2023-08-04 NOTE — Patient Instructions (Signed)

## 2023-08-04 NOTE — Progress Notes (Signed)
Safety precautions to be maintained throughout the outpatient stay will include: orient to surroundings, keep bed in low position, maintain call bell within reach at all times, provide assistance with transfer out of bed and ambulation.  

## 2023-08-05 ENCOUNTER — Telehealth: Payer: Self-pay

## 2023-08-05 NOTE — Telephone Encounter (Signed)
Post procedure follow up.  Patient states she is dtill hurting.  Instructed to put heat on it today and to give the steroid time to start working.  Instructed to call for any further questions or concerns.
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
## 2023-08-18 ENCOUNTER — Ambulatory Visit: Payer: 59 | Attending: Pain Medicine | Admitting: Pain Medicine

## 2023-08-18 ENCOUNTER — Encounter: Payer: Self-pay | Admitting: Pain Medicine

## 2023-08-18 VITALS — BP 111/78 | HR 80 | Temp 97.3°F | Ht 64.0 in | Wt 201.0 lb

## 2023-08-18 DIAGNOSIS — M5417 Radiculopathy, lumbosacral region: Secondary | ICD-10-CM | POA: Diagnosis not present

## 2023-08-18 DIAGNOSIS — G8929 Other chronic pain: Secondary | ICD-10-CM | POA: Diagnosis present

## 2023-08-18 DIAGNOSIS — Z09 Encounter for follow-up examination after completed treatment for conditions other than malignant neoplasm: Secondary | ICD-10-CM

## 2023-08-18 DIAGNOSIS — M79604 Pain in right leg: Secondary | ICD-10-CM

## 2023-08-18 DIAGNOSIS — M545 Low back pain, unspecified: Secondary | ICD-10-CM | POA: Diagnosis not present

## 2023-08-18 NOTE — Patient Instructions (Signed)

## 2023-08-18 NOTE — Progress Notes (Signed)
Safety precautions to be maintained throughout the outpatient stay will include: orient to surroundings, keep bed in low position, maintain call bell within reach at all times, provide assistance with transfer out of bed and ambulation.  

## 2023-08-19 ENCOUNTER — Encounter: Payer: Self-pay | Admitting: Pain Medicine

## 2023-08-22 ENCOUNTER — Other Ambulatory Visit: Payer: Self-pay

## 2023-08-22 ENCOUNTER — Emergency Department: Payer: 59

## 2023-08-22 ENCOUNTER — Emergency Department
Admission: EM | Admit: 2023-08-22 | Discharge: 2023-08-22 | Disposition: A | Discharge status: Home / Self Care | Payer: 59 | Attending: Emergency Medicine | Admitting: Emergency Medicine

## 2023-08-22 DIAGNOSIS — R1030 Lower abdominal pain, unspecified: Secondary | ICD-10-CM | POA: Diagnosis present

## 2023-08-22 DIAGNOSIS — N3000 Acute cystitis without hematuria: Secondary | ICD-10-CM | POA: Diagnosis not present

## 2023-08-22 DIAGNOSIS — R1084 Generalized abdominal pain: Secondary | ICD-10-CM

## 2023-08-22 LAB — URINALYSIS, ROUTINE W REFLEX MICROSCOPIC
Bilirubin Urine: NEGATIVE
Glucose, UA: NEGATIVE mg/dL
Hgb urine dipstick: NEGATIVE
Ketones, ur: NEGATIVE mg/dL
Nitrite: NEGATIVE
Protein, ur: NEGATIVE mg/dL
Specific Gravity, Urine: 1.016 (ref 1.005–1.030)
pH: 6 (ref 5.0–8.0)

## 2023-08-22 LAB — CBC
HCT: 39.5 % (ref 36.0–46.0)
Hemoglobin: 13.2 g/dL (ref 12.0–15.0)
MCH: 32 pg (ref 26.0–34.0)
MCHC: 33.4 g/dL (ref 30.0–36.0)
MCV: 95.6 fL (ref 80.0–100.0)
Platelets: 325 10*3/uL (ref 150–400)
RBC: 4.13 MIL/uL (ref 3.87–5.11)
RDW: 13.9 % (ref 11.5–15.5)
WBC: 11.2 10*3/uL — ABNORMAL HIGH (ref 4.0–10.5)
nRBC: 0 % (ref 0.0–0.2)

## 2023-08-22 LAB — COMPREHENSIVE METABOLIC PANEL
ALT: 16 U/L (ref 0–44)
AST: 11 U/L — ABNORMAL LOW (ref 15–41)
Albumin: 3.9 g/dL (ref 3.5–5.0)
Alkaline Phosphatase: 96 U/L (ref 38–126)
Anion gap: 8 (ref 5–15)
BUN: 13 mg/dL (ref 6–20)
CO2: 24 mmol/L (ref 22–32)
Calcium: 9 mg/dL (ref 8.9–10.3)
Chloride: 105 mmol/L (ref 98–111)
Creatinine, Ser: 0.74 mg/dL (ref 0.44–1.00)
GFR, Estimated: >60 mL/min (ref >60)
Glucose, Bld: 107 mg/dL — ABNORMAL HIGH (ref 70–99)
Potassium: 3.7 mmol/L (ref 3.5–5.1)
Sodium: 137 mmol/L (ref 135–145)
Total Bilirubin: 0.6 mg/dL (ref 0.3–1.2)
Total Protein: 7.3 g/dL (ref 6.5–8.1)

## 2023-08-22 LAB — LIPASE, BLOOD: Lipase: 22 U/L (ref 11–51)

## 2023-08-22 MED ORDER — FAMOTIDINE 20 MG PO TABS
20.0000 mg | ORAL_TABLET | Freq: Once | ORAL | Status: AC
  Administered 2023-08-22: 20 mg via ORAL
  Filled 2023-08-22: qty 1

## 2023-08-22 MED ORDER — CEPHALEXIN 500 MG PO CAPS
500.0000 mg | ORAL_CAPSULE | Freq: Once | ORAL | Status: AC
  Administered 2023-08-22: 500 mg via ORAL
  Filled 2023-08-22: qty 1

## 2023-08-22 MED ORDER — CEPHALEXIN 500 MG PO CAPS: 500.0000 mg | ORAL_CAPSULE | Freq: Two times a day (BID) | ORAL | 0 refills | Status: AC

## 2023-08-22 MED ORDER — ONDANSETRON 4 MG PO TBDP
4.0000 mg | ORAL_TABLET | Freq: Once | ORAL | Status: AC
  Administered 2023-08-22: 4 mg via ORAL
  Filled 2023-08-22: qty 1

## 2023-08-22 NOTE — Discharge Instructions (Signed)
You were seen in the emergency department for abdominal pain.  CT scan did not show any signs of a bowel obstruction or bad constipation.  You did have some findings of a urinary tract infection.  You were started on antibiotics.  The urine was sent for culture so we will know in a couple of days if your urine was infected.  Follow-up closely with your primary care physician and with your psychiatrist.  Make sure that you eat plenty of foods whenever you are taking your vitamins.  Eat small frequent meals.  Do not lie down immediately after eating.  Thank you for choosing [place] for your health care, it was my pleasure to care for you today!  Corena Herter, MD

## 2023-08-22 NOTE — ED Provider Notes (Signed)
[facility] Provider Note    Event Date/Time   First MD Initiated Contact with Patient 08/22/23 1233     (approximate)   History   Abdominal Pain   HPI  Jamie Ross is a 37 y.o. female presents to the emergency department for abdominal pain.  Suprapubic abdominal pain with nausea that has been ongoing for the past couple of weeks.  Takes vitamin D, melatonin and turmeric which has been added on by her pain management specialist.  Multiple psychiatric medications and they were concerned that it could be interfering with these medications.  Lower abdominal pain.  Endorses nausea but no episodes of vomiting.  No diarrhea or constipation.  History of urinary tract infections.     Physical Exam   Triage Vital Signs: ED Triage Vitals  Encounter Vitals Group     BP 08/22/23 1047 (!) 129/92     Systolic BP Percentile --      Diastolic BP Percentile --      Pulse Rate 08/22/23 1047 60     Resp 08/22/23 1048 17     Temp 08/22/23 1047 97.7 F (36.5 C)     Temp Source 08/22/23 1047 Oral     SpO2 08/22/23 1047 100 %     Weight 08/22/23 1048 201 lb 1 oz (91.2 kg)     Height 08/22/23 1048 5\' 4"  (1.626 m)     Head Circumference --      Peak Flow --      Pain Score 08/22/23 1048 8     Pain Loc --      Pain Education --      Exclude from Growth Chart --     Most recent vital signs: Vitals:   08/22/23 1047 08/22/23 1048  BP: (!) 129/92   Pulse: 60   Resp:  17  Temp: 97.7 F (36.5 C)   SpO2: 100%     Physical Exam Constitutional:      Appearance: She is well-developed.  HENT:     Head: Atraumatic.  Eyes:     Conjunctiva/sclera: Conjunctivae normal.  Cardiovascular:     Rate and Rhythm: Regular rhythm.  Pulmonary:     Effort: No respiratory distress.  Abdominal:     General: There is no distension.     Tenderness: There is abdominal tenderness in the suprapubic area. There is no right CVA tenderness or left CVA tenderness.   Musculoskeletal:        General: Normal range of motion.     Cervical back: Normal range of motion.  Skin:    General: Skin is warm.  Neurological:     Mental Status: She is alert. Mental status is at baseline.     IMPRESSION / MDM / ASSESSMENT AND PLAN / ED COURSE  I reviewed the triage vital signs and the nursing notes.   Differential diagnosis including UTI, bowel obstruction, constipation, electrolyte abnormality, dehydration   RADIOLOGY I independently reviewed imaging, my interpretation of imaging: CT abdomen and pelvis with no signs of a bowel obstruction  LABS (all labs ordered are listed, but only abnormal results are displayed) Labs interpreted as -    Labs Reviewed  COMPREHENSIVE METABOLIC PANEL - Abnormal; Notable for the following components:      Result Value   Glucose, Bld 107 (*)    AST 11 (*)    All other components within normal limits  CBC - Abnormal; Notable for the following components:   WBC 11.2 (*)  All other components within normal limits  URINALYSIS, ROUTINE W REFLEX MICROSCOPIC - Abnormal; Notable for the following components:   Color, Urine YELLOW (*)    APPearance HAZY (*)    Leukocytes,Ua MODERATE (*)    Bacteria, UA RARE (*)    All other components within normal limits  URINE CULTURE  LIPASE, BLOOD  POC URINE PREG, ED     MDM  UA with questionable findings of urinary tract infection.  Given her suprapubic pain we will go ahead and treat the patient.  Given first dose of Keflex in the emergency department.  Urine was sent for culture.  Patient was given Zofran and Pepcid.  Already takes a PPI.  Discussed close follow-up with primary care physician.  Given return precautions for any worsening symptoms.  Urine was sent for culture.     PROCEDURES:  Critical Care performed: No  Procedures  Patient's presentation is most consistent with acute complicated illness / injury requiring diagnostic workup.   MEDICATIONS ORDERED IN  ED: Medications  famotidine (PEPCID) tablet 20 mg (20 mg Oral Given 08/22/23 1315)  ondansetron (ZOFRAN-ODT) disintegrating tablet 4 mg (4 mg Oral Given 08/22/23 1315)  cephALEXin (KEFLEX) capsule 500 mg (500 mg Oral Given 08/22/23 1455)    FINAL CLINICAL IMPRESSION(S) / ED DIAGNOSES   Final diagnoses:  Generalized abdominal pain  Acute cystitis without hematuria     Rx / DC Orders   ED Discharge Orders          Ordered    cephALEXin (KEFLEX) 500 MG capsule  2 times daily        08/22/23 1518             Note:  This document was prepared using Dragon voice recognition software and may include unintentional dictation errors.   Corena Herter, MD 08/22/23 [phone]

## 2023-08-22 NOTE — ED Triage Notes (Signed)
Pt here from [facility] with abd pain x2 weeks. Pt takes Linzess to help with her diarrhea. Pt states pain is upper. Pt is concerned about interactions with her psych medicine. Pt having NVD.

## 2023-08-22 NOTE — ED Notes (Signed)
This RN called CT regarding this pt.  Pt is not sexually active and parents are willing to sign a waiver for pregnancy.  They, CT, said that they would take pt when able

## 2023-08-23 LAB — URINE CULTURE

## 2023-08-27 ENCOUNTER — Ambulatory Visit: Payer: 59 | Attending: Pain Medicine | Admitting: Pain Medicine

## 2023-08-27 ENCOUNTER — Ambulatory Visit: Payer: 59 | Admitting: Pain Medicine

## 2023-08-27 ENCOUNTER — Encounter: Payer: Self-pay | Admitting: Pain Medicine

## 2023-08-27 ENCOUNTER — Ambulatory Visit
Admission: RE | Admit: 2023-08-27 | Discharge: 2023-08-27 | Disposition: A | Discharge status: Home / Self Care | Payer: 59 | Source: Ambulatory Visit | Attending: Pain Medicine | Admitting: Pain Medicine

## 2023-08-27 VITALS — BP 117/64 | HR 80 | Temp 97.8°F | Resp 20 | Ht 64.0 in | Wt 201.0 lb

## 2023-08-27 DIAGNOSIS — M79604 Pain in right leg: Secondary | ICD-10-CM | POA: Insufficient documentation

## 2023-08-27 DIAGNOSIS — M5417 Radiculopathy, lumbosacral region: Secondary | ICD-10-CM | POA: Diagnosis present

## 2023-08-27 DIAGNOSIS — Z5189 Encounter for other specified aftercare: Secondary | ICD-10-CM

## 2023-08-27 DIAGNOSIS — M5137 Other intervertebral disc degeneration, lumbosacral region: Secondary | ICD-10-CM | POA: Insufficient documentation

## 2023-08-27 DIAGNOSIS — R937 Abnormal findings on diagnostic imaging of other parts of musculoskeletal system: Secondary | ICD-10-CM | POA: Diagnosis present

## 2023-08-27 DIAGNOSIS — M545 Low back pain, unspecified: Secondary | ICD-10-CM | POA: Diagnosis present

## 2023-08-27 DIAGNOSIS — G8929 Other chronic pain: Secondary | ICD-10-CM

## 2023-08-27 DIAGNOSIS — M51379 Other intervertebral disc degeneration, lumbosacral region without mention of lumbar back pain or lower extremity pain: Secondary | ICD-10-CM

## 2023-08-27 MED ORDER — TRIAMCINOLONE ACETONIDE 40 MG/ML IJ SUSP
40.0000 mg | Freq: Once | INTRAMUSCULAR | Status: AC
  Administered 2023-08-27: 40 mg
  Filled 2023-08-27: qty 1

## 2023-08-27 MED ORDER — SODIUM CHLORIDE (PF) 0.9 % IJ SOLN
INTRAMUSCULAR | Status: AC
  Filled 2023-08-27: qty 10

## 2023-08-27 MED ORDER — LIDOCAINE HCL 2 % IJ SOLN
20.0000 mL | Freq: Once | INTRAMUSCULAR | Status: AC
  Administered 2023-08-27: 400 mg
  Filled 2023-08-27: qty 20

## 2023-08-27 MED ORDER — MIDAZOLAM HCL 2 MG/2ML IJ SOLN: 0.5000 mg | Freq: Once | INTRAMUSCULAR | Status: DC

## 2023-08-27 MED ORDER — IOHEXOL 180 MG/ML  SOLN
10.0000 mL | Freq: Once | INTRAMUSCULAR | Status: AC
  Administered 2023-08-27: 10 mL via EPIDURAL
  Filled 2023-08-27: qty 20

## 2023-08-27 MED ORDER — ROPIVACAINE HCL 2 MG/ML IJ SOLN
2.0000 mL | Freq: Once | INTRAMUSCULAR | Status: AC
  Administered 2023-08-27: 20 mL via EPIDURAL
  Filled 2023-08-27: qty 20

## 2023-08-27 MED ORDER — PENTAFLUOROPROP-TETRAFLUOROETH EX AERO
INHALATION_SPRAY | Freq: Once | CUTANEOUS | Status: AC
  Administered 2023-08-27: 30 via TOPICAL
  Filled 2023-08-27: qty 116

## 2023-08-27 MED ORDER — SODIUM CHLORIDE 0.9% FLUSH
2.0000 mL | Freq: Once | INTRAVENOUS | Status: AC
  Administered 2023-08-27: 10 mL

## 2023-08-27 MED ORDER — LACTATED RINGERS IV SOLN: Freq: Once | INTRAVENOUS | Status: DC

## 2023-08-27 NOTE — Progress Notes (Signed)
PROVIDER NOTE: Interpretation of information contained herein should be left to medically-trained personnel. Specific patient instructions are provided elsewhere under "Patient Instructions" section of medical record. This document was created in part using STT-dictation technology, any transcriptional errors that may result from this process are unintentional.  Patient: Jamie Ross Type: Established DOB: February 15, 1986 MRN: [id number] PCP: Bethanie Dicker, NP  Service: Procedure DOS: 08/27/2023 Setting: Ambulatory Location: Ambulatory outpatient facility Delivery: Face-to-face Provider: Oswaldo Done, MD Specialty: Interventional Pain Management Specialty designation: 09 Location: Outpatient facility Ref. Prov.: Bethanie Dicker, NP       Interventional Therapy   Procedure: Lumbar epidural steroid injection (LESI) (interlaminar) #2    Laterality: Right   Level:  L4-5 Level.  Imaging: Fluoroscopic guidance Spinal ([id number]) Anesthesia: Local anesthesia (1-2% Lidocaine) Anxiolysis: None                 Sedation: No Sedation                       DOS: 08/27/2023  Performed by: Oswaldo Done, MD  Purpose: Diagnostic/Therapeutic Indications: Lumbar radicular pain of intraspinal etiology of more than 4 weeks that has failed to respond to conservative therapy and is severe enough to impact quality of life or function. 1. Chronic low back pain (1ry area of Pain) (Bilateral) w/o sciatica   2. Chronic lower extremity pain (2ry area of Pain) (Right)   3. DDD (degenerative disc disease), lumbosacral   4. Lumbosacral radiculopathy (Right)   5. Lumbosacral radiculopathy at L5 (Right)   6. Abnormal MRI, lumbar spine (07/28/2023)    NAS-11 Pain score:   Pre-procedure: 5 /10   Post-procedure: 5 /10      Position / Prep / Materials:  Position: Prone w/ head of the table raised (slight reverse trendelenburg) to facilitate breathing.  Prep solution: DuraPrep (Iodine Povacrylex [0.7%  available iodine] and Isopropyl Alcohol, 74% w/w) Prep Area: Entire Posterior Lumbar Region from lower scapular tip down to mid buttocks area and from flank to flank. Materials:  Tray: Epidural tray Needle(s):  Type: Epidural needle (Tuohy) Gauge (G):  17 Length: Regular (3.5-in) Qty: 1   H&P (Pre-op Assessment):  Jamie Ross is a 37 y.o. (year old), female patient, seen today for interventional treatment. She  has a past surgical history that includes Cholecystectomy; Tonsillectomy; Wisdom tooth extraction; Endometrial ablation; and Umbilical hernia repair. Jamie Ross has a current medication list which includes the following prescription(s): acetaminophen, albuterol, amantadine, budesonide-formoterol, cephalexin, cetirizine, cyclobenzaprine, fluticasone, haloperidol, linzess, meloxicam, omeprazole, ondansetron, prazosin, propranolol, sertraline, topiramate, [DISCONTINUED] hydrochlorothiazide, and [DISCONTINUED] potassium chloride sa. Her primarily concern today is the Back Pain (lower)  Initial Vital Signs:  Pulse/HCG Rate: 80ECG Heart Rate: 76 Temp: 97.8 F (36.6 C) Resp: 16 BP: 118/74 SpO2: 97 %  BMI: Estimated body mass index is 34.5 kg/m as calculated from the following:   Height as of this encounter: 5\' 4"  (1.626 m).   Weight as of this encounter: 201 lb (91.2 kg).  Risk Assessment: Allergies: Reviewed. She is allergic to doxycycline, medroxyprogesterone, methylprednisolone, penicillins, risperidone, risperidone and related, and septra [sulfamethoxazole-trimethoprim].  Allergy Precautions: None required Coagulopathies: Reviewed. None identified.  Blood-thinner therapy: None at this time Active Infection(s): Reviewed. None identified. Jamie Ross is afebrile  Site Confirmation: Jamie Ross was asked to confirm the procedure and laterality before marking the site Procedure checklist: Completed Consent: Before the procedure and under the influence of no sedative(s),  amnesic(s), or anxiolytics, the patient was informed of  the treatment options, risks and possible complications. To fulfill our ethical and legal obligations, as recommended by the American Medical Association's Code of Ethics, I have informed the patient of my clinical impression; the nature and purpose of the treatment or procedure; the risks, benefits, and possible complications of the intervention; the alternatives, including doing nothing; the risk(s) and benefit(s) of the alternative treatment(s) or procedure(s); and the risk(s) and benefit(s) of doing nothing. The patient was provided information about the general risks and possible complications associated with the procedure. These may include, but are not limited to: failure to achieve desired goals, infection, bleeding, organ or nerve damage, allergic reactions, paralysis, and death. In addition, the patient was informed of those risks and complications associated to Spine-related procedures, such as failure to decrease pain; infection (i.e.: Meningitis, epidural or intraspinal abscess); bleeding (i.e.: epidural hematoma, subarachnoid hemorrhage, or any other type of intraspinal or peri-dural bleeding); organ or nerve damage (i.e.: Any type of peripheral nerve, nerve root, or spinal cord injury) with subsequent damage to sensory, motor, and/or autonomic systems, resulting in permanent pain, numbness, and/or weakness of one or several areas of the body; allergic reactions; (i.e.: anaphylactic reaction); and/or death. Furthermore, the patient was informed of those risks and complications associated with the medications. These include, but are not limited to: allergic reactions (i.e.: anaphylactic or anaphylactoid reaction(s)); adrenal axis suppression; blood sugar elevation that in diabetics may result in ketoacidosis or comma; water retention that in patients with history of congestive heart failure may result in shortness of breath, pulmonary edema, and  decompensation with resultant heart failure; weight gain; swelling or edema; medication-induced neural toxicity; particulate matter embolism and blood vessel occlusion with resultant organ, and/or nervous system infarction; and/or aseptic necrosis of one or more joints. Finally, the patient was informed that Medicine is not an exact science; therefore, there is also the possibility of unforeseen or unpredictable risks and/or possible complications that may result in a catastrophic outcome. The patient indicated having understood very clearly. We have given the patient no guarantees and we have made no promises. Enough time was given to the patient to ask questions, all of which were answered to the patient's satisfaction. Ms. Weiman has indicated that she wanted to continue with the procedure. Attestation: I, the ordering provider, attest that I have discussed with the patient the benefits, risks, side-effects, alternatives, likelihood of achieving goals, and potential problems during recovery for the procedure that I have provided informed consent. Date  Time: 08/27/2023 10:50 AM   Pre-Procedure Preparation:  Monitoring: As per clinic protocol. Respiration, ETCO2, SpO2, BP, heart rate and rhythm monitor placed and checked for adequate function Safety Precautions: Patient was assessed for positional comfort and pressure points before starting the procedure. Time-out: I initiated and conducted the "Time-out" before starting the procedure, as per protocol. The patient was asked to participate by confirming the accuracy of the "Time Out" information. Verification of the correct person, site, and procedure were performed and confirmed by me, the nursing staff, and the patient. "Time-out" conducted as per Joint Commission's Universal Protocol (UP.01.01.01). Time: 1228 Start Time: 1230 hrs.  Description/Narrative of Procedure:          Target: Epidural space via interlaminar opening, initially targeting  the lower laminar border of the superior vertebral body. Region: Lumbar Approach: Percutaneous paravertebral  Rationale (medical necessity): procedure needed and proper for the diagnosis and/or treatment of the patient's medical symptoms and needs. Procedural Technique Safety Precautions: Aspiration looking for blood return was conducted  prior to all injections. At no point did we inject any substances, as a needle was being advanced. No attempts were made at seeking any paresthesias. Safe injection practices and needle disposal techniques used. Medications properly checked for expiration dates. SDV (single dose vial) medications used. Description of the Procedure: Protocol guidelines were followed. The procedure needle was introduced through the skin, ipsilateral to the reported pain, and advanced to the target area. Bone was contacted and the needle walked caudad, until the lamina was cleared. The epidural space was identified using "loss-of-resistance technique" with 2-3 ml of PF-NaCl (0.9% NSS), in a 5cc LOR glass syringe.  Vitals:   08/27/23 1049 08/27/23 1231 08/27/23 1236  BP: 118/74 117/64 (P) 119/81  Pulse: 80    Resp: 16 20 (P) 19  Temp: 97.8 F (36.6 C)    TempSrc: Temporal    SpO2: 97% 99% (P) 96%  Weight: 201 lb (91.2 kg)    Height: 5\' 4"  (1.626 m)      Start Time: 1230 hrs. End Time: 1236 hrs.  Imaging Guidance (Spinal):          Type of Imaging Technique: Fluoroscopy Guidance (Spinal) Indication(s): Assistance in needle guidance and placement for procedures requiring needle placement in or near specific anatomical locations not easily accessible without such assistance. Exposure Time: Please see nurses notes. Contrast: Before injecting any contrast, we confirmed that the patient did not have an allergy to iodine, shellfish, or radiological contrast. Once satisfactory needle placement was completed at the desired level, radiological contrast was injected. Contrast injected  under live fluoroscopy. No contrast complications. See chart for type and volume of contrast used. Fluoroscopic Guidance: I was personally present during the use of fluoroscopy. "Tunnel Vision Technique" used to obtain the best possible view of the target area. Parallax error corrected before commencing the procedure. "Direction-depth-direction" technique used to introduce the needle under continuous pulsed fluoroscopy. Once target was reached, antero-posterior, oblique, and lateral fluoroscopic projection used confirm needle placement in all planes. Images permanently stored in EMR. Interpretation: I personally interpreted the imaging intraoperatively. Adequate needle placement confirmed in multiple planes. Appropriate spread of contrast into desired area was observed. No evidence of afferent or efferent intravascular uptake. No intrathecal or subarachnoid spread observed. Permanent images saved into the patient's record.  Antibiotic Prophylaxis:   Anti-infectives (From admission, onward)    None      Indication(s): None identified  Post-operative Assessment:  Post-procedure Vital Signs:  Pulse/HCG Rate: 80(P) 76 Temp: 97.8 F (36.6 C) Resp: (P) 19 BP: (P) 119/81 SpO2: (P) 96 %  EBL: None  Complications: No immediate post-treatment complications observed by team, or reported by patient.  Note: The patient tolerated the entire procedure well. A repeat set of vitals were taken after the procedure and the patient was kept under observation following institutional policy, for this type of procedure. Post-procedural neurological assessment was performed, showing return to baseline, prior to discharge. The patient was provided with post-procedure discharge instructions, including a section on how to identify potential problems. Should any problems arise concerning this procedure, the patient was given instructions to immediately contact us, at any time, without hesitation. In any case, we plan  to contact the patient by telephone for a follow-up status report regarding this interventional procedure.  Comments:  No additional relevant information.  Plan of Care (POC)  Orders:  Orders Placed This Encounter  Procedures   Lumbar Epidural Injection    Scheduling Instructions:     Procedure: Interlaminar LESI L4-5  Laterality: Right     Sedation: Patient's choice     Timeframe: Today    Order Specific Question:   Where will this procedure be performed?    Answer:   [facility]   Lumbar Epidural Injection    Standing Status:   Future    Standing Expiration Date:   11/26/2023    Scheduling Instructions:     Procedure: Interlaminar Lumbar Epidural Steroid injection (LESI)  L4-5     Laterality: Right-sided     Sedation: Patient's choice.     Timeframe: 2 weeks from now    Order Specific Question:   Where will this procedure be performed?    Answer:   [facility]   DG PAIN [facility] C-ARM 1-60 MIN NO REPORT    Intraoperative interpretation by procedural physician at [facility].    Standing Status:   Standing    Number of Occurrences:   1    Order Specific Question:   Reason for exam:    Answer:   Assistance in needle guidance and placement for procedures requiring needle placement in or near specific anatomical locations not easily accessible without such assistance.   Informed Consent Details: Physician/Practitioner Attestation; Transcribe to consent form and obtain patient signature    Note: Always confirm laterality of pain with Ms. Jesus, before procedure. Transcribe to consent form and obtain patient signature.    Order Specific Question:   Physician/Practitioner attestation of informed consent for procedure/surgical case    Answer:   I, the physician/practitioner, attest that I have discussed with the patient the benefits, risks, side effects, alternatives, likelihood of achieving goals and potential problems during recovery for the procedure  that I have provided informed consent.    Order Specific Question:   Procedure    Answer:   Lumbar epidural steroid injection under fluoroscopic guidance    Order Specific Question:   Physician/Practitioner performing the procedure    Answer:   Yer Olivencia A. Laban Emperor, MD    Order Specific Question:   Indication/Reason    Answer:   Low back and/or lower extremity pain secondary to lumbar radiculitis   Provide equipment / supplies at bedside    Procedural tray: Epidural Tray (Disposable  single use) Skin infiltration needle: Regular 1.5-in, 25-G, (x1) Block needle size: Regular standard Catheter: No catheter required    Standing Status:   Standing    Number of Occurrences:   1    Order Specific Question:   Specify    Answer:   Epidural Tray   Chronic Opioid Analgesic:  No chronic opioid analgesics therapy prescribed by our practice. None MME/day: 0 mg/day   Medications ordered for procedure: Meds ordered this encounter  Medications   iohexol (OMNIPAQUE) 180 MG/ML injection 10 mL    Must be Myelogram-compatible. If not available, you may substitute with a water-soluble, non-ionic, hypoallergenic, myelogram-compatible radiological contrast medium.   lidocaine (XYLOCAINE) 2 % (with pres) injection 400 mg   pentafluoroprop-tetrafluoroeth (GEBAUERS) aerosol   DISCONTD: lactated ringers infusion   DISCONTD: midazolam (VERSED) injection 0.5-2 mg    Make sure Flumazenil is available in the pyxis when using this medication. If oversedation occurs, administer 0.2 mg IV over 15 sec. If after 45 sec no response, administer 0.2 mg again over 1 min; may repeat at 1 min intervals; not to exceed 4 doses (1 mg)   sodium chloride flush (NS) 0.9 % injection 2 mL   ropivacaine (PF) 2 mg/mL (0.2%) (NAROPIN) injection 2 mL  triamcinolone acetonide (KENALOG-40) injection 40 mg   Medications administered: We administered iohexol, lidocaine, pentafluoroprop-tetrafluoroeth, sodium chloride flush, ropivacaine  (PF) 2 mg/mL (0.2%), and triamcinolone acetonide.  See the medical record for exact dosing, route, and time of administration.  Follow-up plan:   Return in about 2 weeks (around 09/10/2023) for [facility]): (R) L4-5 LESI #3.       Interventional Therapies  Risk Factors  Considerations  Medical Comorbidities:       Planned  Pending:   Therapeutic right L4-5 LESI #2    Under consideration:   Therapeutic right L4-5 LESI #2    Completed:   Diagnostic right L4-5 LESI x1 (08/04/2023) (100/100/50/50) Diagnostic bilateral lumbar facet (L2-S1) MBB x1 (07/14/2023) (100/0/0/0)    Therapeutic  Palliative (PRN) options:   None established   Completed by other providers:   Therapeutic right L2, L3, L4, and L5 "lumbosacral SNRB" x1 (04/23/2015) by Dr. Ewing Schlein (N/A)       Recent Visits Date Type Provider Dept  08/18/23 Office Visit Delano Metz, MD [facility]  08/04/23 Procedure visit Delano Metz, MD [facility]  07/28/23 Office Visit Delano Metz, MD [facility]  07/16/23 Office Visit Delano Metz, MD [facility]  07/14/23 Procedure visit Delano Metz, MD [facility]  06/15/23 Office Visit Delano Metz, MD [facility]  Showing recent visits within past 90 days and meeting all other requirements Today's Visits Date Type Provider Dept  08/27/23 Procedure visit Delano Metz, MD [facility]  Showing today's visits and meeting all other requirements Future Appointments Date Type Provider Dept  09/15/23 Appointment Delano Metz, MD [facility]  Showing future appointments within next 90 days and meeting all other requirements  Disposition: Discharge home  Discharge (Date  Time): 08/27/2023;   hrs.   Primary Care Physician: Bethanie Dicker, NP Location: [facility] Note by: Oswaldo Done, MD (TTS technology used. I  apologize for any typographical errors that were not detected and corrected.) Date: 08/27/2023; Time: 12:52 PM  Disclaimer:  Medicine is not an Visual merchandiser. The only guarantee in medicine is that nothing is guaranteed. It is important to note that the decision to proceed with this intervention was based on the information collected from the patient. The Data and conclusions were drawn from the patient's questionnaire, the interview, and the physical examination. Because the information was provided in large part by the patient, it cannot be guaranteed that it has not been purposely or unconsciously manipulated. Every effort has been made to obtain as much relevant data as possible for this evaluation. It is important to note that the conclusions that lead to this procedure are derived in large part from the available data. Always take into account that the treatment will also be dependent on availability of resources and existing treatment guidelines, considered by other Pain Management Practitioners as being common knowledge and practice, at the time of the intervention. For Medico-Legal purposes, it is also important to point out that variation in procedural techniques and pharmacological choices are the acceptable norm. The indications, contraindications, technique, and results of the above procedure should only be interpreted and judged by a Board-Certified Interventional Pain Specialist with extensive familiarity and expertise in the same exact procedure and technique.

## 2023-08-27 NOTE — Patient Instructions (Addendum)
______________________________________________________________________    Post-Procedure Discharge Instructions  Instructions: Apply ice:  Purpose: This will minimize any swelling and discomfort after procedure.  When: Day of procedure, as soon as you get home. How: Fill a plastic sandwich bag with crushed ice. Cover it with a small towel and apply to injection site. How long: (15 min on, 15 min off) Apply for 15 minutes then remove x 15 minutes.  Repeat sequence on day of procedure, until you go to bed. Apply heat:  Purpose: To treat any soreness and discomfort from the procedure. When: Starting the next day after the procedure. How: Apply heat to procedure site starting the day following the procedure. How long: May continue to repeat daily, until discomfort goes away. Food intake: Start with clear liquids (like water) and advance to regular food, as tolerated.  Physical activities: Keep activities to a minimum for the first 8 hours after the procedure. After that, then as tolerated. Driving: If you have received any sedation, be responsible and do not drive. You are not allowed to drive for 24 hours after having sedation. Blood thinner: (Applies only to those taking blood thinners) You may restart your blood thinner 6 hours after your procedure. Insulin: (Applies only to Diabetic patients taking insulin) As soon as you can eat, you may resume your normal dosing schedule. Infection prevention: Keep procedure site clean and dry. Shower daily and clean area with soap and water. Post-procedure Pain Diary: Extremely important that this be done correctly and accurately. Recorded information will be used to determine the next step in treatment. For the purpose of accuracy, follow these rules: Evaluate only the area treated. Do not report or include pain from an untreated area. For the purpose of this evaluation, ignore all other areas of pain, except for the treated area. After your procedure,  avoid taking a long nap and attempting to complete the pain diary after you wake up. Instead, set your alarm clock to go off every hour, on the hour, for the initial 8 hours after the procedure. Document the duration of the numbing medicine, and the relief you are getting from it. Do not go to sleep and attempt to complete it later. It will not be accurate. If you received sedation, it is likely that you were given a medication that may cause amnesia. Because of this, completing the diary at a later time may cause the information to be inaccurate. This information is needed to plan your care. Follow-up appointment: Keep your post-procedure follow-up evaluation appointment after the procedure (usually 2 weeks for most procedures, 6 weeks for radiofrequencies). DO NOT FORGET to bring you pain diary with you.   Expect: (What should I expect to see with my procedure?) From numbing medicine (AKA: Local Anesthetics): Numbness or decrease in pain. You may also experience some weakness, which if present, could last for the duration of the local anesthetic. Onset: Full effect within 15 minutes of injected. Duration: It will depend on the type of local anesthetic used. On the average, 1 to 8 hours.  From steroids (Applies only if steroids were used): Decrease in swelling or inflammation. Once inflammation is improved, relief of the pain will follow. Onset of benefits: Depends on the amount of swelling present. The more swelling, the longer it will take for the benefits to be seen. In some cases, up to 10 days. Duration: Steroids will stay in the system x 2 weeks. Duration of benefits will depend on multiple posibilities including persistent irritating factors. Side-effects: If  present, they may typically last 2 weeks (the duration of the steroids). Frequent: Cramps (if they occur, drink Gatorade and take over-the-counter Magnesium 450-500 mg once to twice a day); water retention with temporary weight gain;  increases in blood sugar; decreased immune system response; increased appetite. Occasional: Facial flushing (red, warm cheeks); mood swings; menstrual changes. Uncommon: Long-term decrease or suppression of natural hormones; bone thinning. (These are more common with higher doses or more frequent use. This is why we prefer that our patients avoid having any injection therapies in other practices.)  Very Rare: Severe mood changes; psychosis; aseptic necrosis. From procedure: Some discomfort is to be expected once the numbing medicine wears off. This should be minimal if ice and heat are applied as instructed.  Call if: (When should I call?) You experience numbness and weakness that gets worse with time, as opposed to wearing off. New onset bowel or bladder incontinence. (Applies only to procedures done in the spine)  Emergency Numbers: Durning business hours (Monday - Thursday, 8:00 AM - 4:00 PM) (Friday, 9:00 AM - 12:00 Noon): [phone] After hours: [phone] NOTE: If you are having a problem and are unable connect with, or to talk to a provider, then go to your nearest urgent care or emergency department. If the problem is serious and urgent, please call 911. ______________________________________________________________________      ______________________________________________________________________    Procedure instructions  Stop blood-thinners  Do not eat or drink fluids (other than water) for 6 hours before your procedure  No water for 2 hours before your procedure  Take your blood pressure medicine with a sip of water  Arrive 30 minutes before your appointment  If sedation is planned, bring suitable driver. Pennie Banter, Benedetto Goad, & public transportation are NOT APPROVED)  Carefully read the "Preparing for your procedure" detailed instructions  If you have questions call us at [phone]  ______________________________________________________________________       ______________________________________________________________________    Preparing for your procedure  Appointments: If you think you may not be able to keep your appointment, call 24-48 hours in advance to cancel. We need time to make it available to others.  During your procedure appointment there will be: No Prescription Refills. No disability issues to discussed. No medication changes or discussions.  Instructions: Food intake: Avoid eating anything solid for at least 8 hours prior to your procedure. Clear liquid intake: You may take clear liquids such as water up to 2 hours prior to your procedure. (No carbonated drinks. No soda.) Transportation: Unless otherwise stated by your physician, bring a driver. (Driver cannot be a Market researcher, Pharmacist, community, or any other form of public transportation.) Morning Medicines: Except for blood thinners, take all of your other morning medications with a sip of water. Make sure to take your heart and blood pressure medicines. If your blood pressure's lower number is above 100, the case will be rescheduled. Blood thinners: Make sure to stop your blood thinners as instructed.  If you take a blood thinner, but were not instructed to stop it, call our office [phone] and ask to talk to a nurse. Not stopping a blood thinner prior to certain procedures could lead to serious complications. Diabetics on insulin: Notify the staff so that you can be scheduled 1st case in the morning. If your diabetes requires high dose insulin, take only  of your normal insulin dose the morning of the procedure and notify the staff that you have done so. Preventing infections: Shower with an antibacterial soap  the morning of your procedure.  Build-up your immune system: Take 1000 mg of Vitamin C with every meal (3 times a day) the day prior to your procedure. Antibiotics: Inform the nursing staff if you are taking any antibiotics or if you have any conditions that may require antibiotics  prior to procedures. (Example: recent joint implants)   Pregnancy: If you are pregnant make sure to notify the nursing staff. Not doing so may result in injury to the fetus, including death.  Sickness: If you have a cold, fever, or any active infections, call and cancel or reschedule your procedure. Receiving steroids while having an infection may result in complications. Arrival: You must be in the facility at least 30 minutes prior to your scheduled procedure. Tardiness: Your scheduled time is also the cutoff time. If you do not arrive at least 15 minutes prior to your procedure, you will be rescheduled.  Children: Do not bring any children with you. Make arrangements to keep them home. Dress appropriately: There is always a possibility that your clothing may get soiled. Avoid long dresses. Valuables: Do not bring any jewelry or valuables.  Reasons to call and reschedule or cancel your procedure: (Following these recommendations will minimize the risk of a serious complication.) Surgeries: Avoid having procedures within 2 weeks of any surgery. (Avoid for 2 weeks before or after any surgery). Flu Shots: Avoid having procedures within 2 weeks of a flu shots or . (Avoid for 2 weeks before or after immunizations). Barium: Avoid having a procedure within 7-10 days after having had a radiological study involving the use of radiological contrast. (Myelograms, Barium swallow or enema study). Heart attacks: Avoid any elective procedures or surgeries for the initial 6 months after a "Myocardial Infarction" (Heart Attack). Blood thinners: It is imperative that you stop these medications before procedures. Let us know if you if you take any blood thinner.  Infection: Avoid procedures during or within two weeks of an infection (including chest colds or gastrointestinal problems). Symptoms associated with infections include: Localized redness, fever, chills, night sweats or profuse sweating, burning sensation  when voiding, cough, congestion, stuffiness, runny nose, sore throat, diarrhea, nausea, vomiting, cold or Flu symptoms, recent or current infections. It is specially important if the infection is over the area that we intend to treat. Heart and lung problems: Symptoms that may suggest an active cardiopulmonary problem include: cough, chest pain, breathing difficulties or shortness of breath, dizziness, ankle swelling, uncontrolled high or unusually low blood pressure, and/or palpitations. If you are experiencing any of these symptoms, cancel your procedure and contact your primary care physician for an evaluation.  Remember:  Regular Business hours are:  Monday to Thursday 8:00 AM to 4:00 PM  Provider's Schedule: Delano Metz, MD:  Procedure days: Tuesday and Thursday 7:30 AM to 4:00 PM  Edward Jolly, MD:  Procedure days: Monday and Wednesday 7:30 AM to 4:00 PM Last  Updated: 07/28/2023 ______________________________________________________________________      ______________________________________________________________________    General Risks and Possible Complications  Patient Responsibilities: It is important that you read this as it is part of your informed consent. It is our duty to inform you of the risks and possible complications associated with treatments offered to you. It is your responsibility as a patient to read this and to ask questions about anything that is not clear or that you believe was not covered in this document.  Patient's Rights: You have the right to refuse treatment. You also have the right to change  your mind, even after initially having agreed to have the treatment done. However, under this last option, if you wait until the last second to change your mind, you may be charged for the materials used up to that point.  Introduction: Medicine is not an Visual merchandiser. Everything in Medicine, including the lack of treatment(s), carries the potential for  danger, harm, or loss (which is by definition: Risk). In Medicine, a complication is a secondary problem, condition, or disease that can aggravate an already existing one. All treatments carry the risk of possible complications. The fact that a side effects or complications occurs, does not imply that the treatment was conducted incorrectly. It must be clearly understood that these can happen even when everything is done following the highest safety standards.  No treatment: You can choose not to proceed with the proposed treatment alternative. The "PRO(s)" would include: avoiding the risk of complications associated with the therapy. The "CON(s)" would include: not getting any of the treatment benefits. These benefits fall under one of three categories: diagnostic; therapeutic; and/or palliative. Diagnostic benefits include: getting information which can ultimately lead to improvement of the disease or symptom(s). Therapeutic benefits are those associated with the successful treatment of the disease. Finally, palliative benefits are those related to the decrease of the primary symptoms, without necessarily curing the condition (example: decreasing the pain from a flare-up of a chronic condition, such as incurable terminal cancer).  General Risks and Complications: These are associated to most interventional treatments. They can occur alone, or in combination. They fall under one of the following six (6) categories: no benefit or worsening of symptoms; bleeding; infection; nerve damage; allergic reactions; and/or death. No benefits or worsening of symptoms: In Medicine there are no guarantees, only probabilities. No healthcare provider can ever guarantee that a medical treatment will work, they can only state the probability that it may. Furthermore, there is always the possibility that the condition may worsen, either directly, or indirectly, as a consequence of the treatment. Bleeding: This is more common if  the patient is taking a blood thinner, either prescription or over the counter (example: Goody Powders, Fish oil, Aspirin, Garlic, etc.), or if suffering a condition associated with impaired coagulation (example: Hemophilia, cirrhosis of the liver, low platelet counts, etc.). However, even if you do not have one on these, it can still happen. If you have any of these conditions, or take one of these drugs, make sure to notify your treating physician. Infection: This is more common in patients with a compromised immune system, either due to disease (example: diabetes, cancer, human immunodeficiency virus [HIV], etc.), or due to medications or treatments (example: therapies used to treat cancer and rheumatological diseases). However, even if you do not have one on these, it can still happen. If you have any of these conditions, or take one of these drugs, make sure to notify your treating physician. Nerve Damage: This is more common when the treatment is an invasive one, but it can also happen with the use of medications, such as those used in the treatment of cancer. The damage can occur to small secondary nerves, or to large primary ones, such as those in the spinal cord and brain. This damage may be temporary or permanent and it may lead to impairments that can range from temporary numbness to permanent paralysis and/or brain death. Allergic Reactions: Any time a substance or material comes in contact with our body, there is the possibility of an allergic reaction. These  can range from a mild skin rash (contact dermatitis) to a severe systemic reaction (anaphylactic reaction), which can result in death. Death: In general, any medical intervention can result in death, most of the time due to an unforeseen complication. ______________________________________________________________________     ______________________________________________________________________    Procedure instructions  Stop  blood-thinners  Do not eat or drink fluids (other than water) for 6 hours before your procedure  No water for 2 hours before your procedure  Take your blood pressure medicine with a sip of water  Arrive 30 minutes before your appointment  If sedation is planned, bring suitable driver. Pennie Banter, Benedetto Goad, & public transportation are NOT APPROVED)  Carefully read the "Preparing for your procedure" detailed instructions  If you have questions call us at [phone]  ______________________________________________________________________      ______________________________________________________________________    Preparing for your procedure  Appointments: If you think you may not be able to keep your appointment, call 24-48 hours in advance to cancel. We need time to make it available to others.  During your procedure appointment there will be: No Prescription Refills. No disability issues to discussed. No medication changes or discussions.  Instructions: Food intake: Avoid eating anything solid for at least 8 hours prior to your procedure. Clear liquid intake: You may take clear liquids such as water up to 2 hours prior to your procedure. (No carbonated drinks. No soda.) Transportation: Unless otherwise stated by your physician, bring a driver. (Driver cannot be a Market researcher, Pharmacist, community, or any other form of public transportation.) Morning Medicines: Except for blood thinners, take all of your other morning medications with a sip of water. Make sure to take your heart and blood pressure medicines. If your blood pressure's lower number is above 100, the case will be rescheduled. Blood thinners: Make sure to stop your blood thinners as instructed.  If you take a blood thinner, but were not instructed to stop it, call our office [phone] and ask to talk to a nurse. Not stopping a blood thinner prior to certain procedures could lead to serious complications. Diabetics on insulin: Notify the  staff so that you can be scheduled 1st case in the morning. If your diabetes requires high dose insulin, take only  of your normal insulin dose the morning of the procedure and notify the staff that you have done so. Preventing infections: Shower with an antibacterial soap the morning of your procedure.  Build-up your immune system: Take 1000 mg of Vitamin C with every meal (3 times a day) the day prior to your procedure. Antibiotics: Inform the nursing staff if you are taking any antibiotics or if you have any conditions that may require antibiotics prior to procedures. (Example: recent joint implants)   Pregnancy: If you are pregnant make sure to notify the nursing staff. Not doing so may result in injury to the fetus, including death.  Sickness: If you have a cold, fever, or any active infections, call and cancel or reschedule your procedure. Receiving steroids while having an infection may result in complications. Arrival: You must be in the facility at least 30 minutes prior to your scheduled procedure. Tardiness: Your scheduled time is also the cutoff time. If you do not arrive at least 15 minutes prior to your procedure, you will be rescheduled.  Children: Do not bring any children with you. Make arrangements to keep them home. Dress appropriately: There is always a possibility that your clothing may get soiled. Avoid long dresses. Valuables: Do not bring  any jewelry or valuables.  Reasons to call and reschedule or cancel your procedure: (Following these recommendations will minimize the risk of a serious complication.) Surgeries: Avoid having procedures within 2 weeks of any surgery. (Avoid for 2 weeks before or after any surgery). Flu Shots: Avoid having procedures within 2 weeks of a flu shots or . (Avoid for 2 weeks before or after immunizations). Barium: Avoid having a procedure within 7-10 days after having had a radiological study involving the use of radiological contrast. (Myelograms,  Barium swallow or enema study). Heart attacks: Avoid any elective procedures or surgeries for the initial 6 months after a "Myocardial Infarction" (Heart Attack). Blood thinners: It is imperative that you stop these medications before procedures. Let us know if you if you take any blood thinner.  Infection: Avoid procedures during or within two weeks of an infection (including chest colds or gastrointestinal problems). Symptoms associated with infections include: Localized redness, fever, chills, night sweats or profuse sweating, burning sensation when voiding, cough, congestion, stuffiness, runny nose, sore throat, diarrhea, nausea, vomiting, cold or Flu symptoms, recent or current infections. It is specially important if the infection is over the area that we intend to treat. Heart and lung problems: Symptoms that may suggest an active cardiopulmonary problem include: cough, chest pain, breathing difficulties or shortness of breath, dizziness, ankle swelling, uncontrolled high or unusually low blood pressure, and/or palpitations. If you are experiencing any of these symptoms, cancel your procedure and contact your primary care physician for an evaluation.  Remember:  Regular Business hours are:  Monday to Thursday 8:00 AM to 4:00 PM  Provider's Schedule: Delano Metz, MD:  Procedure days: Tuesday and Thursday 7:30 AM to 4:00 PM  Edward Jolly, MD:  Procedure days: Monday and Wednesday 7:30 AM to 4:00 PM Last  Updated: 07/28/2023 ______________________________________________________________________      ______________________________________________________________________    General Risks and Possible Complications  Patient Responsibilities: It is important that you read this as it is part of your informed consent. It is our duty to inform you of the risks and possible complications associated with treatments offered to you. It is your responsibility as a patient to read this and to ask  questions about anything that is not clear or that you believe was not covered in this document.  Patient's Rights: You have the right to refuse treatment. You also have the right to change your mind, even after initially having agreed to have the treatment done. However, under this last option, if you wait until the last second to change your mind, you may be charged for the materials used up to that point.  Introduction: Medicine is not an Visual merchandiser. Everything in Medicine, including the lack of treatment(s), carries the potential for danger, harm, or loss (which is by definition: Risk). In Medicine, a complication is a secondary problem, condition, or disease that can aggravate an already existing one. All treatments carry the risk of possible complications. The fact that a side effects or complications occurs, does not imply that the treatment was conducted incorrectly. It must be clearly understood that these can happen even when everything is done following the highest safety standards.  No treatment: You can choose not to proceed with the proposed treatment alternative. The "PRO(s)" would include: avoiding the risk of complications associated with the therapy. The "CON(s)" would include: not getting any of the treatment benefits. These benefits fall under one of three categories: diagnostic; therapeutic; and/or palliative. Diagnostic benefits include: getting information which can  ultimately lead to improvement of the disease or symptom(s). Therapeutic benefits are those associated with the successful treatment of the disease. Finally, palliative benefits are those related to the decrease of the primary symptoms, without necessarily curing the condition (example: decreasing the pain from a flare-up of a chronic condition, such as incurable terminal cancer).  General Risks and Complications: These are associated to most interventional treatments. They can occur alone, or in combination. They fall  under one of the following six (6) categories: no benefit or worsening of symptoms; bleeding; infection; nerve damage; allergic reactions; and/or death. No benefits or worsening of symptoms: In Medicine there are no guarantees, only probabilities. No healthcare provider can ever guarantee that a medical treatment will work, they can only state the probability that it may. Furthermore, there is always the possibility that the condition may worsen, either directly, or indirectly, as a consequence of the treatment. Bleeding: This is more common if the patient is taking a blood thinner, either prescription or over the counter (example: Goody Powders, Fish oil, Aspirin, Garlic, etc.), or if suffering a condition associated with impaired coagulation (example: Hemophilia, cirrhosis of the liver, low platelet counts, etc.). However, even if you do not have one on these, it can still happen. If you have any of these conditions, or take one of these drugs, make sure to notify your treating physician. Infection: This is more common in patients with a compromised immune system, either due to disease (example: diabetes, cancer, human immunodeficiency virus [HIV], etc.), or due to medications or treatments (example: therapies used to treat cancer and rheumatological diseases). However, even if you do not have one on these, it can still happen. If you have any of these conditions, or take one of these drugs, make sure to notify your treating physician. Nerve Damage: This is more common when the treatment is an invasive one, but it can also happen with the use of medications, such as those used in the treatment of cancer. The damage can occur to small secondary nerves, or to large primary ones, such as those in the spinal cord and brain. This damage may be temporary or permanent and it may lead to impairments that can range from temporary numbness to permanent paralysis and/or brain death. Allergic Reactions: Any time a  substance or material comes in contact with our body, there is the possibility of an allergic reaction. These can range from a mild skin rash (contact dermatitis) to a severe systemic reaction (anaphylactic reaction), which can result in death. Death: In general, any medical intervention can result in death, most of the time due to an unforeseen complication. ______________________________________________________________________

## 2023-08-28 ENCOUNTER — Encounter: Payer: Self-pay | Admitting: Nurse Practitioner

## 2023-08-28 ENCOUNTER — Ambulatory Visit (INDEPENDENT_AMBULATORY_CARE_PROVIDER_SITE_OTHER): Payer: 59 | Admitting: Nurse Practitioner

## 2023-08-28 ENCOUNTER — Telehealth: Payer: Self-pay

## 2023-08-28 VITALS — BP 120/70 | HR 64 | Temp 97.9°F | Resp 17 | Ht 64.0 in | Wt 200.5 lb

## 2023-08-28 DIAGNOSIS — J302 Other seasonal allergic rhinitis: Secondary | ICD-10-CM

## 2023-08-28 DIAGNOSIS — G629 Polyneuropathy, unspecified: Secondary | ICD-10-CM

## 2023-08-28 DIAGNOSIS — R110 Nausea: Secondary | ICD-10-CM | POA: Diagnosis not present

## 2023-08-28 DIAGNOSIS — R300 Dysuria: Secondary | ICD-10-CM

## 2023-08-28 LAB — POCT URINALYSIS DIPSTICK
Bilirubin, UA: NEGATIVE
Blood, UA: NEGATIVE
Glucose, UA: NEGATIVE
Ketones, UA: NEGATIVE
Nitrite, UA: NEGATIVE
Protein, UA: NEGATIVE
Spec Grav, UA: 1.01 (ref 1.010–1.025)
Urobilinogen, UA: 0.2 E.U./dL
pH, UA: 6 (ref 5.0–8.0)

## 2023-08-28 LAB — B12 AND FOLATE PANEL
Folate: 5.5 ng/mL — ABNORMAL LOW (ref >5.9)
Vitamin B-12: 216 pg/mL (ref 211–911)

## 2023-08-28 MED ORDER — ONDANSETRON 4 MG PO TBDP
4.0000 mg | ORAL_TABLET | Freq: Three times a day (TID) | ORAL | 5 refills | Status: DC | PRN
Start: 2023-08-28 — End: 2023-11-17

## 2023-08-28 MED ORDER — LORATADINE 10 MG PO TABS: 10.0000 mg | ORAL_TABLET | Freq: Every day | ORAL | 5 refills | Status: DC

## 2023-08-28 NOTE — Patient Instructions (Addendum)
Please go to lab for blood work. Refilled Claritin and zofran.

## 2023-08-28 NOTE — Telephone Encounter (Signed)
Post procedure follow up.  Patients father states she is doing well.
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
## 2023-08-29 LAB — URINE CULTURE
MICRO NUMBER:: 15495438
SPECIMEN QUALITY:: ADEQUATE

## 2023-08-31 LAB — VITAMIN B6: Vitamin B6: 4.9 ng/mL (ref 2.1–21.7)

## 2023-09-01 ENCOUNTER — Telehealth: Payer: Self-pay | Admitting: Pain Medicine

## 2023-09-01 ENCOUNTER — Telehealth: Payer: Self-pay | Admitting: Nurse Practitioner

## 2023-09-01 NOTE — Telephone Encounter (Signed)
Father states patient is in a lot of pain and is having foot spasms. Has not slept. Please advise.

## 2023-09-01 NOTE — Telephone Encounter (Signed)
Pt dad called wanting to know the pt lab results

## 2023-09-01 NOTE — Telephone Encounter (Signed)
Returned patient phone call, patient had LESI on 08/27/23, explained that sometimes these procedures can cause side affects that  is like dehydration and can cause muscle spasms.  Encouraged that patient stay well hydrated and that FN will usually recommend gatorade or any other sports drink like that.  Also, I do see that patient was prescribed flexeril 10 tid prn muscle spasms and that they could try this as well.  Father verbalizes u/o information and will call if they need further assistance.

## 2023-09-02 NOTE — Progress Notes (Signed)
Please inform the patient: The folate level are lower than normal and B12 levels are normal but on the lower side. Would recommend to consume food rich in folate like green leafy vegetables, legumes, avocado, asparagus or folic acid supplement. Would also recommend B12 supplement since the level is on the lower end of normal.

## 2023-09-06 ENCOUNTER — Other Ambulatory Visit: Payer: Self-pay | Admitting: Nurse Practitioner

## 2023-09-06 DIAGNOSIS — J45909 Unspecified asthma, uncomplicated: Secondary | ICD-10-CM

## 2023-09-06 NOTE — Assessment & Plan Note (Signed)
Symptoms resolved at present. Patient requested urine culture.

## 2023-09-06 NOTE — Assessment & Plan Note (Signed)
Bilateral foot sensation intact. will check for vitamin B12/ folate.

## 2023-09-06 NOTE — Assessment & Plan Note (Signed)
Stable with medication, refill Zofran

## 2023-09-07 ENCOUNTER — Telehealth: Payer: Self-pay | Admitting: Nurse Practitioner

## 2023-09-07 NOTE — Telephone Encounter (Addendum)
Pt father called stating the Zyrtec and Loratadine is not doing any good for the pt. The father want to know jf there is any other medication that the pt can get. The pt was seen by NP Evelene Croon on 9/20

## 2023-09-07 NOTE — Telephone Encounter (Signed)
Pts father has been informed he stated she was not taking both but that she has tried zyrtec. Pts father would like to know if you can send in Allegra to the pharmacy or if he can get it over the counter I informed him I believe he can buy it over the counter and he wanted to see if you could call it in.

## 2023-09-08 ENCOUNTER — Other Ambulatory Visit: Payer: Self-pay | Admitting: Nurse Practitioner

## 2023-09-08 DIAGNOSIS — J309 Allergic rhinitis, unspecified: Secondary | ICD-10-CM

## 2023-09-08 MED ORDER — FEXOFENADINE HCL 180 MG PO TABS: 180.0000 mg | ORAL_TABLET | Freq: Every day | ORAL | 11 refills | Status: DC

## 2023-09-08 NOTE — Telephone Encounter (Signed)
Father informed.

## 2023-09-14 NOTE — Progress Notes (Unsigned)
PROVIDER NOTE: Interpretation of information contained herein should be left to medically-trained personnel. Specific patient instructions are provided elsewhere under "Patient Instructions" section of medical record. This document was created in part using STT-dictation technology, any transcriptional errors that may result from this process are unintentional.  Patient: Jamie Ross Type: Established DOB: [birth year] MRN: [id number] PCP: Bethanie Dicker, NP  Service: Procedure DOS: 09/15/2023 Setting: Ambulatory Location: Ambulatory outpatient facility Delivery: Face-to-face Provider: Oswaldo Done, MD Specialty: Interventional Pain Management Specialty designation: 09 Location: Outpatient facility Ref. Prov.: Bethanie Dicker, NP       Interventional Therapy   Procedure: Lumbar epidural steroid injection (LESI) (interlaminar) #3    Laterality: Right   Level:  L4-5 Level.  Imaging: Fluoroscopic guidance         Anesthesia: Local anesthesia (1-2% Lidocaine) Anxiolysis:    ***                    Sedation:                         DOS: 09/15/2023  Performed by: Oswaldo Done, MD  Purpose: Diagnostic/Therapeutic Indications: Lumbar radicular pain of intraspinal etiology of more than 4 weeks that has failed to respond to conservative therapy and is severe enough to impact quality of life or function. No diagnosis found. NAS-11 Pain score:   Pre-procedure:  /10   Post-procedure:  /10      Position / Prep / Materials:  Position: Prone w/ head of the table raised (slight reverse trendelenburg) to facilitate breathing.  Prep solution: DuraPrep (Iodine Povacrylex [0.7% available iodine] and Isopropyl Alcohol, 74% w/w) Prep Area: Entire Posterior Lumbar Region from lower scapular tip down to mid buttocks area and from flank to flank. Materials:  Tray: Epidural tray Needle(s):  Type: Epidural needle (Tuohy) Gauge (G):  17 Length: Regular (3.5-in) Qty: 1   H&P (Pre-op  Assessment):  Jamie Ross is a 37 y.o. (year old), female patient, seen today for interventional treatment. She  has a past surgical history that includes Cholecystectomy; Tonsillectomy; Wisdom tooth extraction; Endometrial ablation; and Umbilical hernia repair. Jamie Ross has a current medication list which includes the following prescription(s): acetaminophen, albuterol, amantadine, budesonide-formoterol, cyclobenzaprine, fexofenadine, fluticasone, haloperidol, linzess, meloxicam, omeprazole, ondansetron, prazosin, propranolol, sertraline, topiramate, [DISCONTINUED] hydrochlorothiazide, and [DISCONTINUED] potassium chloride sa. Her primarily concern today is the No chief complaint on file.  Initial Vital Signs:  Pulse/HCG Rate:    Temp:   Resp:   BP:   SpO2:    BMI: Estimated body mass index is 34.42 kg/m as calculated from the following:   Height as of 08/28/23: 5\' 4"  (1.626 m).   Weight as of 08/28/23: 200 lb 8 oz (90.9 kg).  Risk Assessment: Allergies: Reviewed. She is allergic to doxycycline, medroxyprogesterone, methylprednisolone, penicillins, risperidone, risperidone and related, and septra [sulfamethoxazole-trimethoprim].  Allergy Precautions: None required Coagulopathies: Reviewed. None identified.  Blood-thinner therapy: None at this time Active Infection(s): Reviewed. None identified. Jamie Ross is afebrile  Site Confirmation: Jamie Ross was asked to confirm the procedure and laterality before marking the site Procedure checklist: Completed Consent: Before the procedure and under the influence of no sedative(s), amnesic(s), or anxiolytics, the patient was informed of the treatment options, risks and possible complications. To fulfill our ethical and legal obligations, as recommended by the American Medical Association's Code of Ethics, I have informed the patient of my clinical impression; the nature and purpose of the treatment or procedure; the  risks, benefits, and  possible complications of the intervention; the alternatives, including doing nothing; the risk(s) and benefit(s) of the alternative treatment(s) or procedure(s); and the risk(s) and benefit(s) of doing nothing. The patient was provided information about the general risks and possible complications associated with the procedure. These may include, but are not limited to: failure to achieve desired goals, infection, bleeding, organ or nerve damage, allergic reactions, paralysis, and death. In addition, the patient was informed of those risks and complications associated to Spine-related procedures, such as failure to decrease pain; infection (i.e.: Meningitis, epidural or intraspinal abscess); bleeding (i.e.: epidural hematoma, subarachnoid hemorrhage, or any other type of intraspinal or peri-dural bleeding); organ or nerve damage (i.e.: Any type of peripheral nerve, nerve root, or spinal cord injury) with subsequent damage to sensory, motor, and/or autonomic systems, resulting in permanent pain, numbness, and/or weakness of one or several areas of the body; allergic reactions; (i.e.: anaphylactic reaction); and/or death. Furthermore, the patient was informed of those risks and complications associated with the medications. These include, but are not limited to: allergic reactions (i.e.: anaphylactic or anaphylactoid reaction(s)); adrenal axis suppression; blood sugar elevation that in diabetics may result in ketoacidosis or comma; water retention that in patients with history of congestive heart failure may result in shortness of breath, pulmonary edema, and decompensation with resultant heart failure; weight gain; swelling or edema; medication-induced neural toxicity; particulate matter embolism and blood vessel occlusion with resultant organ, and/or nervous system infarction; and/or aseptic necrosis of one or more joints. Finally, the patient was informed that Medicine is not an exact science; therefore, there  is also the possibility of unforeseen or unpredictable risks and/or possible complications that may result in a catastrophic outcome. The patient indicated having understood very clearly. We have given the patient no guarantees and we have made no promises. Enough time was given to the patient to ask questions, all of which were answered to the patient's satisfaction. Jamie Ross has indicated that she wanted to continue with the procedure. Attestation: I, the ordering provider, attest that I have discussed with the patient the benefits, risks, side-effects, alternatives, likelihood of achieving goals, and potential problems during recovery for the procedure that I have provided informed consent. Date  Time: {CHL [facility]-PAIN TIME CHOICES:[id number]}   Pre-Procedure Preparation:  Monitoring: As per clinic protocol. Respiration, ETCO2, SpO2, BP, heart rate and rhythm monitor placed and checked for adequate function Safety Precautions: Patient was assessed for positional comfort and pressure points before starting the procedure. Time-out: I initiated and conducted the "Time-out" before starting the procedure, as per protocol. The patient was asked to participate by confirming the accuracy of the "Time Out" information. Verification of the correct person, site, and procedure were performed and confirmed by me, the nursing staff, and the patient. "Time-out" conducted as per Joint Commission's Universal Protocol (UP.01.01.01). Time:   Start Time:   hrs.  Description/Narrative of Procedure:          Target: Epidural space via interlaminar opening, initially targeting the lower laminar border of the superior vertebral body. Region: Lumbar Approach: Percutaneous paravertebral  Rationale (medical necessity): procedure needed and proper for the diagnosis and/or treatment of the patient's medical symptoms and needs. Procedural Technique Safety Precautions: Aspiration looking for blood return was conducted prior  to all injections. At no point did we inject any substances, as a needle was being advanced. No attempts were made at seeking any paresthesias. Safe injection practices and needle disposal techniques used. Medications properly checked for expiration  dates. SDV (single dose vial) medications used. Description of the Procedure: Protocol guidelines were followed. The procedure needle was introduced through the skin, ipsilateral to the reported pain, and advanced to the target area. Bone was contacted and the needle walked caudad, until the lamina was cleared. The epidural space was identified using "loss-of-resistance technique" with 2-3 ml of PF-NaCl (0.9% NSS), in a 5cc LOR glass syringe.  There were no vitals filed for this visit.  Start Time:   hrs. End Time:   hrs.  Imaging Guidance (Spinal):          Type of Imaging Technique: Fluoroscopy Guidance (Spinal) Indication(s): Assistance in needle guidance and placement for procedures requiring needle placement in or near specific anatomical locations not easily accessible without such assistance. Exposure Time: Please see nurses notes. Contrast: Before injecting any contrast, we confirmed that the patient did not have an allergy to iodine, shellfish, or radiological contrast. Once satisfactory needle placement was completed at the desired level, radiological contrast was injected. Contrast injected under live fluoroscopy. No contrast complications. See chart for type and volume of contrast used. Fluoroscopic Guidance: I was personally present during the use of fluoroscopy. "Tunnel Vision Technique" used to obtain the best possible view of the target area. Parallax error corrected before commencing the procedure. "Direction-depth-direction" technique used to introduce the needle under continuous pulsed fluoroscopy. Once target was reached, antero-posterior, oblique, and lateral fluoroscopic projection used confirm needle placement in all planes. Images  permanently stored in EMR. Interpretation: I personally interpreted the imaging intraoperatively. Adequate needle placement confirmed in multiple planes. Appropriate spread of contrast into desired area was observed. No evidence of afferent or efferent intravascular uptake. No intrathecal or subarachnoid spread observed. Permanent images saved into the patient's record.  Antibiotic Prophylaxis:   Anti-infectives (From admission, onward)    None      Indication(s): None identified  Post-operative Assessment:  Post-procedure Vital Signs:  Pulse/HCG Rate:    Temp:   Resp:   BP:   SpO2:    EBL: None  Complications: No immediate post-treatment complications observed by team, or reported by patient.  Note: The patient tolerated the entire procedure well. A repeat set of vitals were taken after the procedure and the patient was kept under observation following institutional policy, for this type of procedure. Post-procedural neurological assessment was performed, showing return to baseline, prior to discharge. The patient was provided with post-procedure discharge instructions, including a section on how to identify potential problems. Should any problems arise concerning this procedure, the patient was given instructions to immediately contact us, at any time, without hesitation. In any case, we plan to contact the patient by telephone for a follow-up status report regarding this interventional procedure.  Comments:  No additional relevant information.  Plan of Care (POC)  Orders:  No orders of the defined types were placed in this encounter.  Chronic Opioid Analgesic:  No chronic opioid analgesics therapy prescribed by our practice. None MME/day: 0 mg/day   Medications ordered for procedure: No orders of the defined types were placed in this encounter.  Medications administered: Jamie Ross had no medications administered during this visit.  See the medical record for  exact dosing, route, and time of administration.  Follow-up plan:   No follow-ups on file.       Interventional Therapies  Risk Factors  Considerations  Medical Comorbidities:       Planned  Pending:   Therapeutic right L4-5 LESI #2    Under consideration:  Therapeutic right L4-5 LESI #2    Completed:   Diagnostic right L4-5 LESI x1 (08/04/2023) (100/100/50/50) Diagnostic bilateral lumbar facet (L2-S1) MBB x1 (07/14/2023) (100/0/0/0)    Therapeutic  Palliative (PRN) options:   None established   Completed by other providers:   Therapeutic right L2, L3, L4, and L5 "lumbosacral SNRB" x1 (04/23/2015) by Dr. Ewing Schlein (N/A)        Recent Visits Date Type Provider Dept  08/27/23 Procedure visit Delano Metz, MD [facility]  08/18/23 Office Visit Delano Metz, MD [facility]  08/04/23 Procedure visit Delano Metz, MD [facility]  07/28/23 Office Visit Delano Metz, MD [facility]  07/16/23 Office Visit Delano Metz, MD [facility]  07/14/23 Procedure visit Delano Metz, MD [facility]  Showing recent visits within past 90 days and meeting all other requirements Future Appointments Date Type Provider Dept  09/15/23 Appointment Delano Metz, MD [facility]  Showing future appointments within next 90 days and meeting all other requirements  Disposition: Discharge home  Discharge (Date  Time): 09/15/2023;   hrs.   Primary Care Physician: Bethanie Dicker, NP Location: [facility] Note by: Oswaldo Done, MD (TTS technology used. I apologize for any typographical errors that were not detected and corrected.) Date: 09/15/2023; Time: 12:38 PM  Disclaimer:  Medicine is not an Visual merchandiser. The only guarantee in medicine is that nothing is guaranteed. It is important to note that the decision to proceed with this intervention was  based on the information collected from the patient. The Data and conclusions were drawn from the patient's questionnaire, the interview, and the physical examination. Because the information was provided in large part by the patient, it cannot be guaranteed that it has not been purposely or unconsciously manipulated. Every effort has been made to obtain as much relevant data as possible for this evaluation. It is important to note that the conclusions that lead to this procedure are derived in large part from the available data. Always take into account that the treatment will also be dependent on availability of resources and existing treatment guidelines, considered by other Pain Management Practitioners as being common knowledge and practice, at the time of the intervention. For Medico-Legal purposes, it is also important to point out that variation in procedural techniques and pharmacological choices are the acceptable norm. The indications, contraindications, technique, and results of the above procedure should only be interpreted and judged by a Board-Certified Interventional Pain Specialist with extensive familiarity and expertise in the same exact procedure and technique.

## 2023-09-15 ENCOUNTER — Ambulatory Visit: Payer: 59 | Attending: Pain Medicine | Admitting: Pain Medicine

## 2023-09-15 ENCOUNTER — Ambulatory Visit: Admission: RE | Admit: 2023-09-15 | Payer: 59 | Source: Ambulatory Visit

## 2023-09-15 ENCOUNTER — Encounter: Payer: Self-pay | Admitting: Pain Medicine

## 2023-09-15 VITALS — BP 124/82 | HR 69 | Temp 97.3°F | Resp 16 | Ht 64.0 in | Wt 198.0 lb

## 2023-09-15 DIAGNOSIS — G8929 Other chronic pain: Secondary | ICD-10-CM | POA: Insufficient documentation

## 2023-09-15 DIAGNOSIS — M79604 Pain in right leg: Secondary | ICD-10-CM | POA: Diagnosis present

## 2023-09-15 DIAGNOSIS — M5417 Radiculopathy, lumbosacral region: Secondary | ICD-10-CM | POA: Diagnosis not present

## 2023-09-15 DIAGNOSIS — Z09 Encounter for follow-up examination after completed treatment for conditions other than malignant neoplasm: Secondary | ICD-10-CM | POA: Insufficient documentation

## 2023-09-15 DIAGNOSIS — R937 Abnormal findings on diagnostic imaging of other parts of musculoskeletal system: Secondary | ICD-10-CM | POA: Diagnosis present

## 2023-09-15 DIAGNOSIS — M545 Low back pain, unspecified: Secondary | ICD-10-CM | POA: Insufficient documentation

## 2023-09-15 DIAGNOSIS — F79 Unspecified intellectual disabilities: Secondary | ICD-10-CM | POA: Insufficient documentation

## 2023-09-15 DIAGNOSIS — R6250 Unspecified lack of expected normal physiological development in childhood: Secondary | ICD-10-CM | POA: Diagnosis present

## 2023-09-15 MED ORDER — MIDAZOLAM HCL 2 MG/2ML IJ SOLN: 0.5000 mg | Freq: Once | INTRAMUSCULAR | Status: DC

## 2023-09-15 MED ORDER — SODIUM CHLORIDE 0.9% FLUSH: 2.0000 mL | Freq: Once | INTRAVENOUS | Status: DC

## 2023-09-15 MED ORDER — TRIAMCINOLONE ACETONIDE 40 MG/ML IJ SUSP: 40.0000 mg | Freq: Once | INTRAMUSCULAR | Status: DC

## 2023-09-15 MED ORDER — LACTATED RINGERS IV SOLN: Freq: Once | INTRAVENOUS | Status: DC

## 2023-09-15 MED ORDER — PENTAFLUOROPROP-TETRAFLUOROETH EX AERO
INHALATION_SPRAY | Freq: Once | CUTANEOUS | Status: DC
  Filled 2023-09-15: qty 30

## 2023-09-15 MED ORDER — IOHEXOL 180 MG/ML  SOLN: 10.0000 mL | Freq: Once | INTRAMUSCULAR | Status: DC

## 2023-09-15 MED ORDER — LIDOCAINE HCL 2 % IJ SOLN: 20.0000 mL | Freq: Once | INTRAMUSCULAR | Status: DC

## 2023-09-15 MED ORDER — ROPIVACAINE HCL 2 MG/ML IJ SOLN: 2.0000 mL | Freq: Once | INTRAMUSCULAR | Status: DC

## 2023-09-15 NOTE — Patient Instructions (Addendum)
______________________________________________________________________    OTC Supplements:   The following is a list of over-the-counter (OTC) supplements that have been found to have NIH [employer]) studies suggesting that they may be of some benefits when used in moderation in some chronic pain-related conditions.  NOTE:  Always consult with your primary care provider and/or pharmacist before taking any OTC medications to make sure they will not interact with your current medications. Always use manufacturer's recommended dosage.  Supplement Possible benefit May be of benefit in treatment of  Active ingredient(s)  Turmeric/curcumin anti-inflammatory Joint and muscle aches and pain associated with arthritis and inflammation The main active ingredient in turmeric is curcumin.  Glucosamine/chondroitin (triple strength) may slow loss of articular cartilage Osteoarthritis glucosamine HCl and chondroitin sulfate  Vitamin D-3 may suppress release of chemicals associated with inflammation Joint and muscle aches and pain associated with arthritis and inflammation CHOLECALCIFEROL; ALPHA.-TOCOPHEROL, D  Moringa anti-inflammatory with mild analgesic effects Joint and muscle aches and pain associated with arthritis and inflammation Many:  Phenolic acids: Gallic acid, ellagic acid, ferulic acid, caffeic acid, o-coumaric acid, and chlorogenic acid. Flavonoids: Rutin, quercetin, rhamnetin, kaempferol, apigenin, and myricetin. Terpenes: Lutein, all-E-luteoxanthin, 13-Z-lutein, 15-Z-?-carotene, and all-E-zeaxanthin. Alkaloids: Marumoside A, marumoside B, and pyrrolemarumine-4?-O-?-L-rhamnopyranoside. Vitamins: Vitamin A and vitamin C. Protein: Milk protein  Melatonin Helps reset sleep cycle. Insomnia. May also be helpful in neurodegenerative disorders melatonin, also known as N-acetyl-5-methoxytryptamine  Vitamin B-12 may help keep nerves and blood cells healthy as well as maintaining function of  nervous system Neuropathies. Nerve pain (Burning pain) methylcobalamin and 5-deoxyadenosylcobalamin  Alpha-Lipoic-Acid (ALA) antioxidant that may help with nerve health, pain, and blocking the activation of some inflammatory chemicals Diabetic neuropathy and metabolic syndrome Alpha-lipoic acid (LA), or 1,2-dithiolane-3-pentanoic acid  superoxide dismutase (SOD) Currently being reviewed.  Superoxide dismutase (SOD); Copper-zinc superoxide dismutase  Tiger Balm Currently being reviewed.  Camphor; Menthol; Capsicum Extract (Capsicin); Methyl Salicylate; Essential Oils (Cassia Oil, Cajuput Oil, Clove Oil, and Dementholized Mint Oil)    ______________________________________________________________________

## 2023-09-16 ENCOUNTER — Emergency Department
Admission: EM | Admit: 2023-09-16 | Discharge: 2023-09-16 | Disposition: A | Discharge status: Home / Self Care | Payer: 59 | Attending: Emergency Medicine | Admitting: Emergency Medicine

## 2023-09-16 ENCOUNTER — Other Ambulatory Visit: Payer: Self-pay

## 2023-09-16 ENCOUNTER — Emergency Department: Payer: 59

## 2023-09-16 ENCOUNTER — Telehealth: Payer: Self-pay

## 2023-09-16 DIAGNOSIS — B9789 Other viral agents as the cause of diseases classified elsewhere: Secondary | ICD-10-CM | POA: Diagnosis not present

## 2023-09-16 DIAGNOSIS — J069 Acute upper respiratory infection, unspecified: Secondary | ICD-10-CM | POA: Diagnosis not present

## 2023-09-16 DIAGNOSIS — R519 Headache, unspecified: Secondary | ICD-10-CM | POA: Diagnosis present

## 2023-09-16 DIAGNOSIS — Z20822 Contact with and (suspected) exposure to covid-19: Secondary | ICD-10-CM | POA: Diagnosis not present

## 2023-09-16 DIAGNOSIS — N3001 Acute cystitis with hematuria: Secondary | ICD-10-CM | POA: Diagnosis not present

## 2023-09-16 LAB — URINALYSIS, ROUTINE W REFLEX MICROSCOPIC
Bilirubin Urine: NEGATIVE
Glucose, UA: NEGATIVE mg/dL
Hgb urine dipstick: NEGATIVE
Ketones, ur: NEGATIVE mg/dL
Nitrite: NEGATIVE
Protein, ur: NEGATIVE mg/dL
Specific Gravity, Urine: 1.02 (ref 1.005–1.030)
pH: 5 (ref 5.0–8.0)

## 2023-09-16 LAB — SARS CORONAVIRUS 2 BY RT PCR: SARS Coronavirus 2 by RT PCR: NEGATIVE

## 2023-09-16 MED ORDER — OXYCODONE HCL 5 MG PO TABS
5.0000 mg | ORAL_TABLET | Freq: Once | ORAL | Status: AC
  Administered 2023-09-16: 5 mg via ORAL
  Filled 2023-09-16: qty 1

## 2023-09-16 MED ORDER — METOCLOPRAMIDE HCL 10 MG PO TABS
10.0000 mg | ORAL_TABLET | Freq: Once | ORAL | Status: AC
  Administered 2023-09-16: 10 mg via ORAL
  Filled 2023-09-16: qty 1

## 2023-09-16 MED ORDER — CEFUROXIME AXETIL 500 MG PO TABS: 500.0000 mg | ORAL_TABLET | Freq: Two times a day (BID) | ORAL | 0 refills | Status: AC

## 2023-09-16 MED ORDER — METOCLOPRAMIDE HCL 10 MG PO TABS: 10.0000 mg | ORAL_TABLET | Freq: Three times a day (TID) | ORAL | 0 refills | Status: DC | PRN

## 2023-09-16 MED ORDER — ALUM & MAG HYDROXIDE-SIMETH 200-200-20 MG/5ML PO SUSP
30.0000 mL | Freq: Once | ORAL | Status: AC
  Administered 2023-09-16: 30 mL via ORAL
  Filled 2023-09-16: qty 30

## 2023-09-16 MED ORDER — ONDANSETRON 4 MG PO TBDP
4.0000 mg | ORAL_TABLET | Freq: Once | ORAL | Status: AC
  Administered 2023-09-16: 4 mg via ORAL
  Filled 2023-09-16: qty 1

## 2023-09-16 MED ORDER — ACETAMINOPHEN 500 MG PO TABS
1000.0000 mg | ORAL_TABLET | Freq: Once | ORAL | Status: AC
  Administered 2023-09-16: 1000 mg via ORAL
  Filled 2023-09-16: qty 2

## 2023-09-16 MED ORDER — LIDOCAINE VISCOUS HCL 2 % MT SOLN
15.0000 mL | Freq: Once | OROMUCOSAL | Status: AC
  Administered 2023-09-16: 15 mL via OROMUCOSAL
  Filled 2023-09-16: qty 15

## 2023-09-16 MED ORDER — IBUPROFEN 600 MG PO TABS
600.0000 mg | ORAL_TABLET | Freq: Once | ORAL | Status: AC
  Administered 2023-09-16: 600 mg via ORAL
  Filled 2023-09-16: qty 1

## 2023-09-16 MED ORDER — OXYCODONE HCL 5 MG PO TABS
5.0000 mg | ORAL_TABLET | ORAL | 0 refills | Status: DC | PRN
Start: 2023-09-16 — End: 2023-11-17

## 2023-09-16 NOTE — Discharge Instructions (Signed)
I suspect your symptoms today may be combination of a viral respiratory infection along with a possible UTI.  I have sent some antibiotics to your pharmacy for you to take.  You can continue taking Tylenol and ibuprofen as needed to help with pain symptoms and bodyaches.  I have also sent nausea medication to your pharmacy to help you keep tolerating drinking.  Liquids and soups are probably the best for the next couple of days and then you can advance to more solid foods as your nausea improves.

## 2023-09-16 NOTE — ED Provider Notes (Addendum)
[facility] Provider Note    Event Date/Time   First MD Initiated Contact with Patient 10/09/Ross 1109     (approximate)   History   Headache and Ross   HPI Jamie Ross is a 37 y.o. female with history of Jamie Ross, Jamie Ross, Jamie Ross, Jamie Ross, Jamie Ross hours she has had bodyaches, headache, intermittent Ross, runny nose.  She has had a dry cough as well.  Father states having similar symptoms 2 days ago which have resolved.  Patient unable to significantly contribute to history given Jamie disability.  No other recent infections that they are aware of.     Physical Exam   Triage Vital Signs: ED Triage Vitals  Encounter Vitals Group     BP 10/09/Ross 1011 123/68     Systolic BP Percentile --      Diastolic BP Percentile --      Pulse Rate 10/09/Ross 1011 80     Resp 10/09/Ross 1011 17     Temp 10/09/Ross 1011 97.8 F (36.6 C)     Temp Source 10/09/Ross 1011 Oral     SpO2 10/09/Ross 1011 97 %     Weight 10/09/Ross 1012 196 lb 3.4 oz (89 kg)     Height 10/09/Ross 1012 5\' 4"  (1.626 m)     Head Circumference --      Peak Flow --      Pain Score 10/09/Ross 1012 0     Pain Loc --      Pain Education --      Exclude from Growth Chart --     Most recent vital signs: Vitals:   10/09/Ross 1011  BP: 123/68  Pulse: 80  Resp: 17  Temp: 97.8 F (36.6 C)  SpO2: 97%   Physical Exam: I have reviewed the vital signs and nursing notes. General: Awake, alert, no acute distress.  Nontoxic appearing. Head:  Atraumatic, normocephalic.   ENT:  EOM intact, PERRL. Oral mucosa is pink and moist with no lesions. Neck: Neck is supple with full range of motion, No meningeal signs. Cardiovascular:  RRR, No murmurs. Peripheral pulses palpable and equal bilaterally. Respiratory:  Symmetrical chest wall expansion.  No  rhonchi, rales, or wheezes.  Good air movement throughout.  No use of accessory muscles.   Musculoskeletal:  No cyanosis or edema. Moving extremities with full ROM Abdomen:  Soft, nontender, nondistended. Neuro:  GCS 15, moving all four extremities, interacting appropriately. Speech clear. Psych:  Calm, appropriate.   Skin:  Warm, dry, no rash.    ED Results / Procedures / Treatments   Labs (all labs ordered are listed, but only abnormal results are displayed) Labs Reviewed  URINALYSIS, ROUTINE W REFLEX MICROSCOPIC - Abnormal; Notable for the following components:      Result Value   Color, Urine YELLOW (*)    APPearance HAZY (*)    Leukocytes,Ua MODERATE (*)    Bacteria, UA RARE (*)    All other components within normal limits  SARS CORONAVIRUS 2 BY RT PCR     EKG My EKG interpretation: Rate of 81, normal sinus rhythm, normal axis, normal intervals.  No acute ST elevations or depressions  RADIOLOGY Independently interpreted chest x-ray with no acute cardiopulmonary pathology   PROCEDURES:  Critical Care performed: No  Procedures   MEDICATIONS ORDERED IN ED: Medications  ondansetron (ZOFRAN-ODT) disintegrating tablet 4 mg (  4 mg Oral Given 10/9/Ross 1126)  ibuprofen (ADVIL) tablet 600 mg (600 mg Oral Given 10/9/Ross 1125)  acetaminophen (TYLENOL) tablet 1,000 mg (1,000 mg Oral Given 10/9/Ross 1125)  oxyCODONE (Oxy IR/ROXICODONE) immediate release tablet 5 mg (5 mg Oral Given 10/9/Ross 1326)  alum & mag hydroxide-simeth (MAALOX/MYLANTA) 200-200-20 MG/5ML suspension 30 mL (30 mLs Oral Given 10/9/Ross 1325)  lidocaine (XYLOCAINE) 2 % viscous mouth solution 15 mL (15 mLs Mouth/Throat Given 10/9/Ross 1326)  metoCLOPramide (REGLAN) tablet 10 mg (10 mg Oral Given 10/9/Ross 1326)     IMPRESSION / MDM / ASSESSMENT AND PLAN / ED COURSE  I reviewed the triage vital signs and the nursing notes.                              Differential diagnosis includes, but is not limited to, viral URI  such as COVID/flu/RSV, pneumonia, UTI.  Patient's presentation is most consistent with acute illness / injury with system symptoms.  Patient is a 37 year old female presenting today with headache, Ross, and generalized bodyaches.  Has had some congestion as well recently along with sick contacts at home concerning for likely viral upper respiratory infection.  Separately they noted dysuria and polyuria possibly contributing to symptoms as well.  Vital signs are stable and physical exam largely unremarkable.  Patient had negative COVID test here.  Chest x-ray unremarkable.  EKG unremarkable.  UA does have some bacteria, WBCs and leukocytes but also has squamous cells concerning for possible contaminant and equivocal for UTI at this time.  Given symptoms, will empirically treat with short course of antibiotics.  Patient has unknown reaction to penicillins in the past per family.  Chart review shows she has tolerated cefdinir and will start her on cefpodoxime at this time.  Patient was sent home with antinausea medicine and instructed on symptomatic management.  Patient also has Jamie pain symptoms, and did give her a short course of oxycodone to help through her current symptoms.  Told to follow-up with their primary care provider and given strict return precautions.  Clinical Course as of 10/09/Ross 1401  Wed Sep 16, 2023  1115 SARS Coronavirus 2 by RT PCR: NEGATIVE [DW]  1115 Urinalysis, Routine w reflex microscopic -Urine, Clean Catch(!) Squamous cells are present with only rare bacteria and low WBCs.  Equivocal for UTI although parents do note that she has been peeing a lot more frequently with painful urination recently and prior history of UTIs. [DW]  1357 Tolerated p.o. [DW]    Clinical Course User Index [DW] Janith Lima, MD     FINAL CLINICAL IMPRESSION(S) / ED DIAGNOSES   Final diagnoses:  Viral URI with cough  Acute cystitis with hematuria     Rx / DC Orders   ED Discharge  Orders          Ordered    cefUROXime (CEFTIN) 500 MG tablet  2 times daily with meals        10/09/Ross 1401    metoCLOPramide (REGLAN) 10 MG tablet  Every 8 hours PRN        10/09/Ross 1401    oxyCODONE (ROXICODONE) 5 MG immediate release tablet  Every 4 hours PRN        10/09/Ross 1401             Note:  This document was prepared using Dragon voice recognition software and may include unintentional dictation errors.   Janith Lima, MD 10/09/Ross  1401    Janith Lima, MD 10/09/Ross [phone]

## 2023-09-16 NOTE — ED Triage Notes (Signed)
Pt presents to ED with c/o of headache and nausea since yesterday. Pt denies fevers or chills. NAD noted.

## 2023-09-16 NOTE — Telephone Encounter (Signed)
Post procedure follow up.  Patients mother states she is sick with a UTI and is throwing up.  States they are taking her to the doctor now.  Instructed patients mother to call us with a follow up.

## 2023-09-21 ENCOUNTER — Telehealth: Payer: Self-pay

## 2023-09-21 NOTE — Transitions of Care (Post Inpatient/ED Visit) (Signed)
on 09/16/23 per pt she was seen [facility] ED for UTI and URI; pt had prod cough with clear phelgm, had burning or pain upon urination; pt has finished abx and now has vaginal discharge and itching in vagina and perineal area.still has frequency of urine and low back pain all the way across the back. offered pt appt at [facility] on 09/22/23 and 09/23/23 but pt already has appt scheduled and pts mom has already scheduled appt wtih Jamie Combes NP on 09/24/23. UC & ED prevcautions given and pt and pts mom voiced understanding. pts nausea is better and pt is eating regular foods again. Sending note to Jamie Combes NP.  [facility]     09/21/2023  Name: Jamie Ross MRN: [id number] DOB: August 01, 1986  Today's TOC FU Call Status: Today's TOC FU Call Status:: Successful TOC FU Call Completed Unsuccessful Call (1st Attempt) Date: 09/21/23 [facility] FU Call Complete Date: 09/21/23 Patient's Name and Date of Birth confirmed.  Transition Care Management Follow-up Telephone Call Date of Discharge: 09/16/23 Discharge Facility: [facility] Type of Discharge: Emergency Department Reason for ED Visit:  (on 09/16/23 per pt she was seen [facility] ED  for UTI and URI; pt had prod cough with clear phelgm, had burning or pain upon urination; pt has finished abx and now has vaginal discharge and itching in vagina and perineal area.still has frequency of urine.) How have you been since you were released from the hospital?: Same Any questions or concerns?: Yes Patient Questions/Concerns:: on 09/16/23 per pt she was seen [facility] ED for UTI and URI; pt had prod cough with clear phelgm, had burning or pain upon urination; pt has finished abx and now has vaginal discharge and itching in vagina and perineal area.still has frequency of urine and low back pain all the way across the back. offered pt appt at [facility] on 09/22/23 and 09/23/23 but pt already has appt scheduled and pts mom has already scheduled appt  wtih Jamie Combes NP on 09/24/23. UC & ED prevcautions given and  pt and  pts mom voiced understanding. pts nausea is better and pt is eating regular foods again. Patient Questions/Concerns Addressed: Notified Provider of Patient Questions/Concerns  Items Reviewed: Did you receive and understand the discharge instructions provided?: Yes Medications obtained,verified, and reconciled?: Partial Review Completed Reason for Partial Mediation Review: discussed meds given at ED. Any new allergies since your discharge?: No Dietary orders reviewed?: Yes (nausea is gone and pt is eating regular foods again.) Do you have support at home?: Yes People in Home: parent(s) Name of Support/Comfort Primary Source: Jamie Ross  Medications Reviewed Today: Medications Reviewed Today   Medications were not reviewed in this encounter     Home Care and Equipment/Supplies: Were Home Health Services Ordered?: NA Any new equipment or medical supplies ordered?: NA  Functional Questionnaire: Do you need assistance with bathing/showering or dressing?: No Do you need assistance with meal preparation?: No Do you need assistance with eating?: No Do you have difficulty maintaining continence: No Do you need assistance with getting out of bed/getting out of a chair/moving?: No Do you have difficulty managing or taking your medications?: No  Follow up appointments reviewed: PCP Follow-up appointment confirmed?: Yes Date of PCP follow-up appointment?: 09/24/23 Follow-up Provider: [name] NP [facility] Follow-up appointment confirmed?: NA Do you need transportation to your follow-up appointment?: No Do you understand care options if your condition(s) worsen?: Yes-patient verbalized understanding    SIGNATURE Jamie Rife, LPN

## 2023-09-23 ENCOUNTER — Ambulatory Visit: Payer: 59 | Attending: Internal Medicine | Admitting: Internal Medicine

## 2023-09-23 ENCOUNTER — Encounter: Payer: Self-pay | Admitting: Internal Medicine

## 2023-09-23 VITALS — BP 108/80 | HR 65 | Ht 64.0 in | Wt 204.4 lb

## 2023-09-23 DIAGNOSIS — I10 Essential (primary) hypertension: Secondary | ICD-10-CM

## 2023-09-23 DIAGNOSIS — R011 Cardiac murmur, unspecified: Secondary | ICD-10-CM

## 2023-09-23 DIAGNOSIS — R079 Chest pain, unspecified: Secondary | ICD-10-CM

## 2023-09-23 NOTE — Patient Instructions (Signed)
Medication Instructions:  Your physician recommends that you continue on your current medications as directed. Please refer to the Current Medication list given to you today.   *If you need a refill on your cardiac medications before your next appointment, please call your pharmacy*   Lab Work: Your provider would like for you to have following labs drawn today (CBC, CMP, Sed Rate, CRP).     Testing/Procedures: Your physician has requested that you have an echocardiogram. Echocardiography is a painless test that uses sound waves to create images of your heart. It provides your doctor with information about the size and shape of your heart and how well your heart's chambers and valves are working.   You may receive an ultrasound enhancing agent through an IV if needed to better visualize your heart during the echo. This procedure takes approximately one hour.  There are no restrictions for this procedure.  This will take place at [address], [place] [address]    Follow-Up: At [facility], you and your health needs are our priority.  As part of our continuing mission to provide you with exceptional heart care, we have created designated Provider Care Teams.  These Care Teams include your primary Cardiologist (physician) and Advanced Practice Providers (APPs -  Physician Assistants and Nurse Practitioners) who all work together to provide you with the care you need, when you need it.  We recommend signing up for the patient portal called "MyChart".  Sign up information is provided on this After Visit Summary.  MyChart is used to connect with patients for Virtual Visits (Telemedicine).  Patients are able to view lab/test results, encounter notes, upcoming appointments, etc.  Non-urgent messages can be sent to your provider as well.   To learn more about what you can do with MyChart, go to [email or web address].    Your next appointment:   1  month(s)  Provider:   You may see Yvonne Kendall, MD or one of the following Advanced Practice Providers on your designated Care Team:   Nicolasa Ducking, NP Eula Listen, PA-C Cadence Fransico Michael, PA-C Charlsie Quest, NP

## 2023-09-23 NOTE — Progress Notes (Signed)
Cardiology Office Note:  .   Date:  09/23/2023  ID:  Jamie Ross, DOB [birth year], MRN [id number] PCP: Bethanie Dicker, NP  [facility] Providers Cardiologist:  None     History of Present Illness: Jamie Ross   Jamie Ross is a 37 y.o. female with history of hypertension, asthma, PTSD, anxiety, depression, and ADHD, who was referred by Ms. Konrad Dolores for evaluation of heart murmur and hypertension.  However, Ms. Boothby is most concerned about a 3-week history of chest pain.  She presents today with her adopted parents.  She reports daily sharp pain in the center of her chest when she takes a deep breath.  This began around the time that she had an infection with subjective fevers and chills as well as vomiting and nonproductive cough.  She has been seen in the ER twice over the last 4 to 6 weeks, most recently on 09/16/2023 when she was diagnosed with a viral URI and acute cystitis.  She was given cefuroxime as well as metoclopramide and oxycodone.  Though her nausea and cough seem to be improving.  She still has chest pain whenever she takes a deep breath.  She has tried NSAIDs and acetaminophen without relief.  She also has persistent dysuria and is now concerned that she may have a yeast infection.  She is scheduled for follow-up with her PCP tomorrow.  Up until about 3 weeks ago, Ms. Aldrige denies any recent chest pain though she believes she was evaluated for the same symptoms last year.  She was seen by Dr. Gilmore Laroche ([facility]) in [place], [place] in 02/2022 for evaluation of heart murmur (based on records in Care Everywhere).  Echocardiogram was technically difficult but did not show any significant abnormalities.  She did not follow-up with him after the initial visit.  She has a history of asthma but feels like her symptoms are well-controlled.  She denies palpitations and edema.  She has frequent orthostatic lightheadedness in the morning but has not passed out or  fallen.  ROS: Ms. Mossholder and her parents are concerned that the patient frequently wakes up around 4 or 5 in the morning.  However, she typically goes to bed at 7 PM (she takes melatonin around that time as well).  Studies Reviewed: Jamie Ross   EKG Interpretation Date/Time:  Wednesday September 23 2023 10:07:01 EDT Ventricular Rate:  65 PR Interval:  142 QRS Duration:  86 QT Interval:  370 QTC Calculation: 384 R Axis:   9  Text Interpretation: Normal sinus rhythm Normal ECG When compared with ECG of 16-Sep-2023 12:38, QT has shortened Otherwise no significant change Confirmed by Brandin Stetzer, Cristal Deer [phone]) on 09/23/2023 10:12:41 AM    TTE (04/03/2022, [facility]): Technically difficult study.  Normal LV size and wall thickness.  LVEF 60-65%.  Normal RV size and function.  Normal biatrial size.  Trileaflet aortic valve without stenosis or regurgitation.  Normal mitral valve with trivial regurgitation.  Normal tricuspid valve with trivial regurgitation.  No pericardial effusion.  Normal CVP.  Risk Assessment/Calculations:             Physical Exam:   VS:  BP 108/80 (BP Location: Left Arm, Patient Position: Sitting, Cuff Size: Large)   Pulse 65   Ht 5\' 4"  (1.626 m)   Wt 204 lb 6.4 oz (92.7 kg)   SpO2 97%   BMI 35.09 kg/m    Wt Readings from Last 3 Encounters:  09/23/23 204 lb 6.4 oz (92.7  kg)  09/16/23 196 lb 3.4 oz (89 kg)  09/15/23 198 lb (89.8 kg)    General:  NAD. Neck: No JVD or HJR. Lungs: Clear to auscultation bilaterally without wheezes or crackles. Heart: Regular rate and rhythm without murmurs, rubs, or gallops. Abdomen: Soft, nontender, nondistended. Extremities: No lower extremity edema.  ASSESSMENT AND PLAN: .    Chest pain: Chest pain is quite atypical, worsened with deep inspiration and reproducible with palpation of the sternum.  It is prudent present for at least 3 weeks and did not respond well to acetaminophen or NSAIDs.  Recent chest radiograph in the ER  did not show any significant abnormalities.  It is hard to gauge if she was already having some chest pain when she was seen in the ED in mid September; I reviewed her CT of the abdomen and pelvis done at that time which showed no evidence of a pericardial effusion or significant coronary artery calcifications in the visualized portions of the heart.  I have a low suspicion for ischemic heart disease.  Given the lack of EKG changes and friction rub, I also think that acute pericarditis is less likely.  We will check several labs today including CBC, CMP, ESR, and CRP.  If there are signs of ongoing inflammation, adjustment of NSAIDs and addition of colchicine could be considered for possible pericarditis.  We will also obtain an expedited echocardiogram.  History of heart murmur: I do not hear heart murmur on exam today.  We will obtain an echo, as above, to exclude pericardial effusion or other findings to explain the patient's aforementioned chest pain.  Hypertension: Blood pressure is well-controlled today though diastolic reading is borderline elevated.  Patient is currently on propranolol and prazosin (I suspect praises send is also for nightmares/PTSD).  We will defer medication changes today.    Dispo: Return to clinic in ~1 month.  Signed, Yvonne Kendall, MD

## 2023-09-24 ENCOUNTER — Ambulatory Visit: Payer: 59 | Admitting: Nurse Practitioner

## 2023-09-24 ENCOUNTER — Other Ambulatory Visit: Payer: Self-pay | Admitting: Nurse Practitioner

## 2023-09-24 ENCOUNTER — Other Ambulatory Visit (HOSPITAL_COMMUNITY)
Admission: RE | Admit: 2023-09-24 | Discharge: 2023-09-24 | Disposition: A | Discharge status: Home / Self Care | Payer: 59 | Source: Ambulatory Visit | Attending: Nurse Practitioner | Admitting: Nurse Practitioner

## 2023-09-24 ENCOUNTER — Telehealth: Payer: Self-pay | Admitting: Internal Medicine

## 2023-09-24 ENCOUNTER — Encounter: Payer: Self-pay | Admitting: Nurse Practitioner

## 2023-09-24 VITALS — BP 100/78 | HR 72 | Temp 98.3°F | Ht 64.0 in | Wt 202.8 lb

## 2023-09-24 DIAGNOSIS — G4700 Insomnia, unspecified: Secondary | ICD-10-CM

## 2023-09-24 DIAGNOSIS — N898 Other specified noninflammatory disorders of vagina: Secondary | ICD-10-CM | POA: Diagnosis present

## 2023-09-24 DIAGNOSIS — N760 Acute vaginitis: Secondary | ICD-10-CM

## 2023-09-24 DIAGNOSIS — F39 Unspecified mood [affective] disorder: Secondary | ICD-10-CM

## 2023-09-24 DIAGNOSIS — D72829 Elevated white blood cell count, unspecified: Secondary | ICD-10-CM

## 2023-09-24 LAB — CBC WITH DIFFERENTIAL/PLATELET
Basophils Absolute: 0.1 10*3/uL (ref 0.0–0.2)
Basos: 1 %
EOS (ABSOLUTE): 0.2 10*3/uL (ref 0.0–0.4)
Eos: 2 %
Hematocrit: 38.7 % (ref 34.0–46.6)
Hemoglobin: 12.4 g/dL (ref 11.1–15.9)
Immature Grans (Abs): 0.1 10*3/uL (ref 0.0–0.1)
Immature Granulocytes: 1 %
Lymphocytes Absolute: 3 10*3/uL (ref 0.7–3.1)
Lymphs: 27 %
MCH: 31.6 pg (ref 26.6–33.0)
MCHC: 32 g/dL (ref 31.5–35.7)
MCV: 99 fL — ABNORMAL HIGH (ref 79–97)
Monocytes Absolute: 0.7 10*3/uL (ref 0.1–0.9)
Monocytes: 6 %
Neutrophils Absolute: 7.1 10*3/uL — ABNORMAL HIGH (ref 1.4–7.0)
Neutrophils: 63 %
Platelets: 322 10*3/uL (ref 150–450)
RBC: 3.92 x10E6/uL (ref 3.77–5.28)
RDW: 14 % (ref 11.7–15.4)
WBC: 11.1 10*3/uL — ABNORMAL HIGH (ref 3.4–10.8)

## 2023-09-24 LAB — POC URINALSYSI DIPSTICK (AUTOMATED)
Bilirubin, UA: NEGATIVE
Glucose, UA: NEGATIVE
Ketones, UA: NEGATIVE
Nitrite, UA: NEGATIVE
Protein, UA: NEGATIVE
Spec Grav, UA: >=1.03 — AB (ref 1.010–1.025)
Urobilinogen, UA: 0.2 U/dL
pH, UA: 5.5 (ref 5.0–8.0)

## 2023-09-24 LAB — COMPREHENSIVE METABOLIC PANEL
ALT: 11 [IU]/L (ref 0–32)
AST: 9 [IU]/L (ref 0–40)
Albumin: 4.1 g/dL (ref 3.9–4.9)
Alkaline Phosphatase: 115 [IU]/L (ref 44–121)
BUN/Creatinine Ratio: 13 (ref 9–23)
BUN: 9 mg/dL (ref 6–20)
Bilirubin Total: 0.3 mg/dL (ref 0.0–1.2)
CO2: 22 mmol/L (ref 20–29)
Calcium: 9.2 mg/dL (ref 8.7–10.2)
Chloride: 105 mmol/L (ref 96–106)
Creatinine, Ser: 0.7 mg/dL (ref 0.57–1.00)
Globulin, Total: 2.1 g/dL (ref 1.5–4.5)
Glucose: 82 mg/dL (ref 70–99)
Potassium: 4.3 mmol/L (ref 3.5–5.2)
Sodium: 139 mmol/L (ref 134–144)
Total Protein: 6.2 g/dL (ref 6.0–8.5)
eGFR: 114 mL/min/{1.73_m2} (ref >59)

## 2023-09-24 LAB — URINALYSIS, ROUTINE W REFLEX MICROSCOPIC
Bilirubin Urine: NEGATIVE
Ketones, ur: NEGATIVE
Leukocytes,Ua: NEGATIVE
Nitrite: NEGATIVE
Specific Gravity, Urine: >=1.03 — AB (ref 1.000–1.030)
Total Protein, Urine: NEGATIVE
Urine Glucose: NEGATIVE
Urobilinogen, UA: 0.2 (ref 0.0–1.0)
pH: 5.5 (ref 5.0–8.0)

## 2023-09-24 LAB — C-REACTIVE PROTEIN: CRP: 3 mg/L (ref 0–10)

## 2023-09-24 LAB — SEDIMENTATION RATE: Sed Rate: 7 mm/h (ref 0–32)

## 2023-09-24 MED ORDER — MICONAZOLE NITRATE 2 % EX CREA: TOPICAL_CREAM | CUTANEOUS | 1 refills | Status: DC

## 2023-09-24 MED ORDER — TRAZODONE HCL 50 MG PO TABS
25.0000 mg | ORAL_TABLET | Freq: Every evening | ORAL | 3 refills | Status: DC | PRN
Start: 2023-09-24 — End: 2024-02-04

## 2023-09-24 NOTE — Telephone Encounter (Signed)
Mother returned RN's call regarding lab results.

## 2023-09-24 NOTE — Progress Notes (Unsigned)
Bethanie Dicker, NP-C Phone: [phone]  Jamie Ross is a 37 y.o. female who presents today for vaginal discharge.   Vaginitis: Patient complains of an abnormal vaginal discharge for {numbers; 0-10:33138} {time units:11}. Vaginal symptoms include {vaginitis symptoms:30830}.Vulvar symptoms include {vaginitis symptoms:30830}.STI Risk: {std dx list:14030}Discharge described as: {gyn vaginal discharge:721}.Other associated symptoms: {vaginitis symptoms:30830}.Menstrual pattern: She had been bleeding {vaginal bleeding history:725}. Contraception: {contraceptive method:5051}   Social History   Tobacco Use  Smoking Status Never  Smokeless Tobacco Never    Current Outpatient Medications on File Prior to Visit  Medication Sig Dispense Refill  . acetaminophen (TYLENOL) 650 MG CR tablet Take 1,300 mg by mouth every 8 (eight) hours as needed.    Marland Kitchen albuterol (VENTOLIN HFA) 108 (90 Base) MCG/ACT inhaler TAKE 2 PUFFS BY MOUTH EVERY 6 HOURS AS NEEDED FOR WHEEZE OR SHORTNESS OF BREATH 8.5 each 5  . amantadine (SYMMETREL) 100 MG capsule Take 100 mg by mouth 2 (two) times daily.    . budesonide-formoterol (SYMBICORT) 160-4.5 MCG/ACT inhaler Inhale 2 puffs into the lungs 2 (two) times daily. 1 Inhaler 0  . cyclobenzaprine (FLEXERIL) 10 MG tablet Take 1 tablet (10 mg total) by mouth 3 (three) times daily as needed for muscle spasms. 90 tablet 0  . fexofenadine (ALLEGRA ALLERGY) 180 MG tablet Take 1 tablet (180 mg total) by mouth daily. 30 tablet 11  . fluticasone (FLONASE) 50 MCG/ACT nasal spray 1 spray into each nostril daily. 15.8 mL 3  . haloperidol (HALDOL) 5 MG tablet Take 0.5 tablets (2.5 mg total) by mouth 2 (two) times daily. 30 tablet 0  . LINZESS 72 MCG capsule Take 72 mcg by mouth daily.    . meloxicam (MOBIC) 15 MG tablet Take 1 tablet (15 mg total) by mouth daily. 90 tablet 3  . metoCLOPramide (REGLAN) 10 MG tablet Take 1 tablet (10 mg total) by mouth every 8 (eight) hours as needed for up  to 5 days for nausea. 15 tablet 0  . omeprazole (PRILOSEC) 40 MG capsule Take 40 mg by mouth every morning.    . ondansetron (ZOFRAN-ODT) 4 MG disintegrating tablet Take 1 tablet (4 mg total) by mouth every 8 (eight) hours as needed for nausea or vomiting. 30 tablet 5  . oxyCODONE (ROXICODONE) 5 MG immediate release tablet Take 1 tablet (5 mg total) by mouth every 4 (four) hours as needed for severe pain. 5 tablet 0  . prazosin (MINIPRESS) 2 MG capsule Take 1 capsule (2 mg total) by mouth 2 (two) times daily. 60 capsule 0  . propranolol (INDERAL) 10 MG tablet Take 10 mg by mouth 2 (two) times daily.    . sertraline (ZOLOFT) 50 MG tablet Take 50 mg by mouth daily.    Marland Kitchen topiramate (TOPAMAX) 50 MG tablet Take 100 mg by mouth at bedtime.    . [DISCONTINUED] hydrochlorothiazide (MICROZIDE) 12.5 MG capsule Take 12.5 mg by mouth daily.    . [DISCONTINUED] potassium chloride SA (K-DUR,KLOR-CON) 20 MEQ tablet Take 20 mEq by mouth daily.     No current facility-administered medications on file prior to visit.     ROS see history of present illness  Objective  Physical Exam There were no vitals filed for this visit.  BP Readings from Last 3 Encounters:  09/23/23 108/80  09/16/23 123/68  09/15/23 124/82   Wt Readings from Last 3 Encounters:  09/23/23 204 lb 6.4 oz (92.7 kg)  09/16/23 196 lb 3.4 oz (89 kg)  09/15/23 198 lb (89.8 kg)  Physical Exam Exam conducted with a chaperone present Donavan Foil, CMA).  Constitutional:      General: She is not in acute distress.    Appearance: Normal appearance.  HENT:     Head: Normocephalic.  Cardiovascular:     Rate and Rhythm: Normal rate and regular rhythm.     Heart sounds: Normal heart sounds.  Pulmonary:     Effort: Pulmonary effort is normal.     Breath sounds: Normal breath sounds.  Genitourinary:    Labia:        Right: Tenderness present.        Left: Tenderness present.   Skin:    General: Skin is warm and dry.   Neurological:     General: No focal deficit present.     Mental Status: She is alert.  Psychiatric:        Mood and Affect: Mood normal.        Behavior: Behavior normal.     Assessment/Plan: Please see individual problem list.  There are no diagnoses linked to this encounter.  No follow-ups on file.   Bethanie Dicker, NP-C [facility] [employer] [facility]

## 2023-09-24 NOTE — Telephone Encounter (Signed)
The patient's father has been notified of the result along with recommendations. Pt's father verbalized understanding. All questions (if any) were answered

## 2023-09-25 ENCOUNTER — Other Ambulatory Visit: Payer: Self-pay | Admitting: Nurse Practitioner

## 2023-09-25 DIAGNOSIS — B3731 Acute candidiasis of vulva and vagina: Secondary | ICD-10-CM

## 2023-09-25 LAB — URINE CULTURE
MICRO NUMBER:: 15608288
SPECIMEN QUALITY:: ADEQUATE

## 2023-09-25 LAB — CERVICOVAGINAL ANCILLARY ONLY
Bacterial Vaginitis (gardnerella): NEGATIVE
Candida Glabrata: NEGATIVE
Candida Vaginitis: POSITIVE — AB
Chlamydia: NEGATIVE
Comment: NEGATIVE
Comment: NEGATIVE
Comment: NEGATIVE
Comment: NEGATIVE
Comment: NEGATIVE
Comment: NORMAL
Neisseria Gonorrhea: NEGATIVE
Trichomonas: NEGATIVE

## 2023-09-25 MED ORDER — FLUCONAZOLE 150 MG PO TABS
ORAL_TABLET | ORAL | 0 refills | Status: DC
Start: 2023-09-25 — End: 2023-10-06

## 2023-09-30 ENCOUNTER — Inpatient Hospital Stay: Payer: 59

## 2023-09-30 ENCOUNTER — Inpatient Hospital Stay: Payer: 59 | Attending: Oncology | Admitting: Oncology

## 2023-09-30 ENCOUNTER — Telehealth: Payer: Self-pay | Admitting: Nurse Practitioner

## 2023-09-30 ENCOUNTER — Other Ambulatory Visit: Payer: Self-pay | Admitting: Nurse Practitioner

## 2023-09-30 ENCOUNTER — Encounter: Payer: Self-pay | Admitting: Oncology

## 2023-09-30 VITALS — BP 140/72 | HR 72 | Temp 98.2°F | Wt 206.0 lb

## 2023-09-30 DIAGNOSIS — Z79899 Other long term (current) drug therapy: Secondary | ICD-10-CM | POA: Diagnosis not present

## 2023-09-30 DIAGNOSIS — R110 Nausea: Secondary | ICD-10-CM

## 2023-09-30 DIAGNOSIS — D7589 Other specified diseases of blood and blood-forming organs: Secondary | ICD-10-CM

## 2023-09-30 DIAGNOSIS — D72829 Elevated white blood cell count, unspecified: Secondary | ICD-10-CM | POA: Diagnosis present

## 2023-09-30 DIAGNOSIS — D72828 Other elevated white blood cell count: Secondary | ICD-10-CM

## 2023-09-30 LAB — CBC WITH DIFFERENTIAL/PLATELET
Abs Immature Granulocytes: 0.12 10*3/uL — ABNORMAL HIGH (ref 0.00–0.07)
Basophils Absolute: 0.1 10*3/uL (ref 0.0–0.1)
Basophils Relative: 1 %
Eosinophils Absolute: 0.2 10*3/uL (ref 0.0–0.5)
Eosinophils Relative: 2 %
HCT: 37.4 % (ref 36.0–46.0)
Hemoglobin: 12.5 g/dL (ref 12.0–15.0)
Immature Granulocytes: 1 %
Lymphocytes Relative: 25 %
Lymphs Abs: 3 10*3/uL (ref 0.7–4.0)
MCH: 32.2 pg (ref 26.0–34.0)
MCHC: 33.4 g/dL (ref 30.0–36.0)
MCV: 96.4 fL (ref 80.0–100.0)
Monocytes Absolute: 0.8 10*3/uL (ref 0.1–1.0)
Monocytes Relative: 7 %
Neutro Abs: 7.9 10*3/uL — ABNORMAL HIGH (ref 1.7–7.7)
Neutrophils Relative %: 64 %
Platelets: 296 10*3/uL (ref 150–400)
RBC: 3.88 MIL/uL (ref 3.87–5.11)
RDW: 13.9 % (ref 11.5–15.5)
WBC: 12.1 10*3/uL — ABNORMAL HIGH (ref 4.0–10.5)
nRBC: 0 % (ref 0.0–0.2)

## 2023-09-30 LAB — TSH: TSH: 1.191 u[IU]/mL (ref 0.350–4.500)

## 2023-09-30 LAB — T4, FREE: Free T4: 1.01 ng/dL (ref 0.61–1.12)

## 2023-09-30 MED ORDER — METOCLOPRAMIDE HCL 10 MG PO TABS
10.0000 mg | ORAL_TABLET | Freq: Three times a day (TID) | ORAL | 0 refills | Status: DC | PRN
Start: 2023-09-30 — End: 2023-11-02
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
## 2023-09-30 NOTE — Telephone Encounter (Signed)
Patient just called and would like to get a refill on metoCLOPramide (REGLAN) 10 MG tablet. She said this medication helped her. The pharmacy [id number] [place], [place] - [address], [place] [address] Phone: [phone]  Fax: [phone] DEA #: [id number]  Her number is [phone]. She would like for someone to call her.

## 2023-10-01 ENCOUNTER — Other Ambulatory Visit: Payer: Self-pay | Admitting: *Deleted

## 2023-10-01 DIAGNOSIS — K5900 Constipation, unspecified: Secondary | ICD-10-CM
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
## 2023-10-03 ENCOUNTER — Other Ambulatory Visit: Payer: Self-pay | Admitting: Nurse Practitioner

## 2023-10-03 DIAGNOSIS — F39 Unspecified mood [affective] disorder: Secondary | ICD-10-CM

## 2023-10-05 ENCOUNTER — Other Ambulatory Visit: Payer: Self-pay

## 2023-10-05 ENCOUNTER — Telehealth: Payer: Self-pay | Admitting: Nurse Practitioner

## 2023-10-05 DIAGNOSIS — B3731 Acute candidiasis of vulva and vagina: Secondary | ICD-10-CM

## 2023-10-05 NOTE — Telephone Encounter (Signed)
Patient saw Arville Lime a couple of weeks ago for a yeast infection. Patient's mother called and wanted some more cream. Patient's mother stated patient still has yeast infection, and nothing has changed since she saw [place].

## 2023-10-06 ENCOUNTER — Other Ambulatory Visit: Payer: 59

## 2023-10-06 ENCOUNTER — Other Ambulatory Visit (HOSPITAL_COMMUNITY)
Admission: RE | Admit: 2023-10-06 | Discharge: 2023-10-06 | Disposition: A | Discharge status: Home / Self Care | Payer: 59 | Source: Ambulatory Visit | Attending: Nurse Practitioner | Admitting: Nurse Practitioner

## 2023-10-06 ENCOUNTER — Ambulatory Visit: Payer: 59 | Attending: Internal Medicine

## 2023-10-06 ENCOUNTER — Telehealth (INDEPENDENT_AMBULATORY_CARE_PROVIDER_SITE_OTHER): Payer: 59 | Admitting: Nurse Practitioner

## 2023-10-06 VITALS — Ht 64.0 in | Wt 206.0 lb

## 2023-10-06 DIAGNOSIS — R079 Chest pain, unspecified: Secondary | ICD-10-CM

## 2023-10-06 DIAGNOSIS — B3731 Acute candidiasis of vulva and vagina: Secondary | ICD-10-CM

## 2023-10-06 MED ORDER — KETOCONAZOLE 2 % EX CREA: 1.0000 | TOPICAL_CREAM | Freq: Every day | CUTANEOUS | 1 refills | Status: DC

## 2023-10-06 MED ORDER — FLUCONAZOLE 150 MG PO TABS: ORAL_TABLET | ORAL | 0 refills | Status: DC

## 2023-10-06 MED ORDER — PERFLUTREN LIPID MICROSPHERE
1.0000 mL | INTRAVENOUS | Status: AC | PRN
  Administered 2023-10-06: 2 mL via INTRAVENOUS

## 2023-10-06 NOTE — Assessment & Plan Note (Signed)
Will treat with another round of Diflucan. New vaginal swab obtained. Will discontinue Miconazole and trial Ketoconazole externally. Referral placed to Ob-Gyn for further evaluation.

## 2023-10-06 NOTE — Progress Notes (Signed)
Pt's mother has been informed.
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
## 2023-10-07 LAB — ECHOCARDIOGRAM COMPLETE
AR max vel: 2.15 cm2
AV Area VTI: 2.22 cm2
AV Area mean vel: 2.15 cm2
AV Mean grad: 5 mm[Hg]
AV Peak grad: 9 mm[Hg]
Ao pk vel: 1.5 m/s
Area-P 1/2: 4.31 cm2
Height: 64 in
S' Lateral: 2.7 cm
Weight: 3296 [oz_av]

## 2023-10-07 LAB — CERVICOVAGINAL ANCILLARY ONLY
Bacterial Vaginitis (gardnerella): NEGATIVE
Candida Glabrata: NEGATIVE
Candida Vaginitis: POSITIVE — AB
Comment: NEGATIVE
Comment: NEGATIVE
Comment: NEGATIVE

## 2023-10-07 NOTE — Assessment & Plan Note (Signed)
Chronic issue. Taking OTC Melatonin with little relief. She has been on Sonata in the past. Will trial Trazodone 25-50 mg at bedtime PRN. Counseled on sleep hygiene/sleep routine. Will continue to monitor.

## 2023-10-07 NOTE — Assessment & Plan Note (Signed)
Pelvic exam consistent with yeast vaginitis. Vulva excoriated from itching. Vaginal swab obtained, patient self-swabbed. UA in office with trace leukocytes and small blood. Microscopic and culture pending. Will treat topically with Miconazole 2% cream BID. Will contact patient with swab and urine results for further treatment. She will contact if her symptoms are worsening or persisting.

## 2023-10-08 ENCOUNTER — Other Ambulatory Visit: Payer: Self-pay | Admitting: Nurse Practitioner

## 2023-10-08 DIAGNOSIS — F39 Unspecified mood [affective] disorder: Secondary | ICD-10-CM

## 2023-10-09 ENCOUNTER — Ambulatory Visit (INDEPENDENT_AMBULATORY_CARE_PROVIDER_SITE_OTHER): Payer: 59 | Admitting: Certified Nurse Midwife

## 2023-10-09 VITALS — BP 111/60 | HR 60 | Ht 64.0 in | Wt 203.8 lb

## 2023-10-09 DIAGNOSIS — R300 Dysuria: Secondary | ICD-10-CM | POA: Diagnosis not present

## 2023-10-09 DIAGNOSIS — B9689 Other specified bacterial agents as the cause of diseases classified elsewhere: Secondary | ICD-10-CM

## 2023-10-09 DIAGNOSIS — N760 Acute vaginitis: Secondary | ICD-10-CM

## 2023-10-09 MED ORDER — TERCONAZOLE 0.4 % VA CREA: 1.0000 | TOPICAL_CREAM | Freq: Every day | VAGINAL | 0 refills | Status: DC

## 2023-10-09 MED ORDER — FLUCONAZOLE 150 MG PO TABS: 150.0000 mg | ORAL_TABLET | Freq: Every day | ORAL | 0 refills | Status: DC

## 2023-10-09 NOTE — Progress Notes (Signed)
Jamie Dicker, NP   Chief Complaint  Patient presents with   Vaginitis    HPI:      Jamie Ross is a 37 y.o. No obstetric history on file. whose LMP was No LMP recorded. Patient has had an ablation., presents today for vaginal & vulvar irritation with yeast infection, she was seen by her PCP and swab showed candida. She has taken two doses of diflucan and used ketoconazole cream externally without relief of symptoms. History provided mainly by patient's mother. [name] also reports pain with urination.    Patient Active Problem List   Diagnosis Date Noted   Yeast vaginitis 10/06/2023   Chest pain 09/23/2023   Abnormal MRI, lumbar spine (07/28/2023) 08/03/2023   DDD (degenerative disc disease), lumbosacral 07/28/2023   Insomnia secondary to chronic pain 07/16/2023   Acute vaginitis 07/15/2023   Lumbar facet joint syndrome 06/30/2023   Lumbar facet joint pain 06/30/2023   Chronic low back pain (1ry area of Pain) (Bilateral) w/o sciatica 06/15/2023   Chronic lower extremity pain (2ry area of Pain) (Right) 06/15/2023   Pharmacologic therapy 06/14/2023   Disorder of skeletal system 06/14/2023   Problems influencing health status 06/14/2023   UTI symptoms 06/10/2023   Uncomplicated asthma 04/15/2023   Primary hypertension 03/04/2022   Heart murmur 03/04/2022   Leukocytosis 12/31/2019   Chest discomfort 12/31/2019   MDD (major depressive disorder), recurrent episode, moderate (HCC) 05/14/2017   Mild intermittent asthma without complication 05/14/2017   Sleep apnea 05/14/2017   Mood disorder (HCC) 03/06/2016   Auditory hallucinations    Self-harm    Intellectual disability 07/12/2015   Moderate persistent asthma with acute exacerbation 05/08/2015   Fibromyalgia 04/23/2015   Gastroparesis 06/26/2014   Cough 03/07/2014   Chronic pain syndrome 02/07/2014   Insomnia 02/07/2014   Dermatitis 02/07/2014   Atypical nevi 07/20/2013   Acne 07/20/2013   Benign neoplasm of  skin 07/20/2013   Microscopic hematuria 05/11/2013   Urinary urgency 05/11/2013   Dysuria 04/19/2013   Hypokalemia 03/22/2013   Depression 03/22/2013   Major depressive disorder, single episode 03/22/2013   Chronic nausea 02/22/2013   Other acne 02/22/2013   Chronic headaches 01/12/2013   GERD (gastroesophageal reflux disease) 11/05/2012   Irregular menstrual cycle 11/05/2012   Obesity 04/20/2012   Edema 04/20/2012   Seasonal allergies 04/20/2012   Hematuria 04/14/2012   Neuropathy 02/12/2012   Inverted nipple 02/12/2012   Tachycardia 02/12/2012   Allergic rhinitis 01/27/2012   Developmental delay 01/27/2012   Lack of expected normal physiological development 01/27/2012   Post-traumatic stress disorder, unspecified 10/21/2011    Past Surgical History:  Procedure Laterality Date   CHOLECYSTECTOMY     ENDOMETRIAL ABLATION     Dr. Patton Salles   TONSILLECTOMY     UMBILICAL HERNIA REPAIR     WISDOM TOOTH EXTRACTION      Family History  Adopted: Yes    Social History   Socioeconomic History   Marital status: Single    Spouse name: Not on file   Number of children: Not on file   Years of education: Not on file   Highest education level: Not on file  Occupational History   Not on file  Tobacco Use   Smoking status: Never   Smokeless tobacco: Never  Substance and Sexual Activity   Alcohol use: No   Drug use: No   Sexual activity: Never  Other Topics Concern   Not on file  Social History Narrative   **  Merged History Encounter **       Lives with parents in [place]. Graduated in 2006.    Social Determinants of Health   Financial Resource Strain: Not on file  Food Insecurity: Not on file  Transportation Needs: Not on file  Physical Activity: Not on file  Stress: Not on file  Social Connections: Not on file  Intimate Partner Violence: Not on file    Outpatient Medications Prior to Visit  Medication Sig Dispense Refill   acetaminophen (TYLENOL) 650  MG CR tablet Take 1,300 mg by mouth every 8 (eight) hours as needed.     albuterol (VENTOLIN HFA) 108 (90 Base) MCG/ACT inhaler TAKE 2 PUFFS BY MOUTH EVERY 6 HOURS AS NEEDED FOR WHEEZE OR SHORTNESS OF BREATH 8.5 each 5   amantadine (SYMMETREL) 100 MG capsule Take 100 mg by mouth 2 (two) times daily.     budesonide-formoterol (SYMBICORT) 160-4.5 MCG/ACT inhaler Inhale 2 puffs into the lungs 2 (two) times daily. 1 Inhaler 0   Cyanocobalamin ([facility] B12 GUMMIES PO) Take by mouth.     cyclobenzaprine (FLEXERIL) 10 MG tablet Take 1 tablet (10 mg total) by mouth 3 (three) times daily as needed for muscle spasms. 90 tablet 0   fexofenadine (ALLEGRA ALLERGY) 180 MG tablet Take 1 tablet (180 mg total) by mouth daily. 30 tablet 11   fluconazole (DIFLUCAN) 150 MG tablet Take 1 tablet by mouth x 1 dose. Repeat in 72 hours. 2 tablet 0   fluticasone (FLONASE) 50 MCG/ACT nasal spray 1 spray into each nostril daily. 15.8 mL 3   haloperidol (HALDOL) 5 MG tablet Take 0.5 tablets (2.5 mg total) by mouth 2 (two) times daily. 30 tablet 0   ketoconazole (NIZORAL) 2 % cream Apply 1 Application topically daily. Apply externally. 30 g 1   LINZESS 72 MCG capsule Take 72 mcg by mouth daily.     meloxicam (MOBIC) 15 MG tablet Take 1 tablet (15 mg total) by mouth daily. 90 tablet 3   metoCLOPramide (REGLAN) 10 MG tablet Take 1 tablet (10 mg total) by mouth every 8 (eight) hours as needed for nausea. 30 tablet 0   omeprazole (PRILOSEC) 40 MG capsule Take 40 mg by mouth every morning.     ondansetron (ZOFRAN-ODT) 4 MG disintegrating tablet Take 1 tablet (4 mg total) by mouth every 8 (eight) hours as needed for nausea or vomiting. 30 tablet 5   oxyCODONE (ROXICODONE) 5 MG immediate release tablet Take 1 tablet (5 mg total) by mouth every 4 (four) hours as needed for severe pain. 5 tablet 0   prazosin (MINIPRESS) 2 MG capsule Take 1 capsule (2 mg total) by mouth 2 (two) times daily. 60 capsule 0   propranolol (INDERAL) 10 MG tablet  Take 10 mg by mouth 2 (two) times daily.     sertraline (ZOLOFT) 50 MG tablet Take 50 mg by mouth daily.     topiramate (TOPAMAX) 50 MG tablet Take 100 mg by mouth at bedtime.     traZODone (DESYREL) 50 MG tablet Take 0.5-1 tablets (25-50 mg total) by mouth at bedtime as needed for sleep. 90 tablet 3   No facility-administered medications prior to visit.      ROS:  Review of Systems  Genitourinary:  Positive for dysuria and vaginal pain.     OBJECTIVE:   Vitals:  BP 111/60   Pulse 60   Ht 5\' 4"  (1.626 m)   Wt 203 lb 12.8 oz (92.4 kg)   BMI 34.98  kg/m   Physical Exam Exam conducted with a chaperone present.  Constitutional:      Appearance: Normal appearance.  Cardiovascular:     Rate and Rhythm: Normal rate.  Pulmonary:     Effort: Pulmonary effort is normal.  Genitourinary:    Comments: Erythema to introitus & perineum. NuSwab collected. Neurological:     Mental Status: She is alert.     Results: No results found for this or any previous visit (from the past 24 hour(s)).   Assessment/Plan: Acute vaginitis - Plan: Urine Culture, NuSwab VG Plus+Mycoplasmas,NAA  Recurrent vaginitis - Plan: Urine Culture, NuSwab VG Plus+Mycoplasmas,NAA  Dysuria - Plan: Urine Culture, NuSwab VG Plus+Mycoplasmas,NAA  Repeat diflucan for 3rd dose on Monday, start terconazole for external and internal discomfort. NuSwab sent for identification of candida strain. Avoid scented soaps, use warm water or Dove sensitive only for washing.  Meds ordered this encounter  Medications   fluconazole (DIFLUCAN) 150 MG tablet    Sig: Take 1 tablet (150 mg total) by mouth daily. Take Monday, 72h after 2nd dose. For total of 3 doses each 3 days apart.    Dispense:  1 tablet    Refill:  0    Order Specific Question:   Supervising Provider    Answer:   Hildred Laser [[id number]]   terconazole (TERAZOL 7) 0.4 % vaginal cream    Sig: Place 1 applicator vaginally at bedtime.    Dispense:  45 g     Refill:  0    Order Specific Question:   Supervising Provider    Answer:   Hildred Laser [[id number]]     Dominica Severin, CNM 10/09/2023 11:38 AM

## 2023-10-12 ENCOUNTER — Other Ambulatory Visit: Payer: Self-pay | Admitting: Nurse Practitioner

## 2023-10-12 DIAGNOSIS — K5909 Other constipation: Secondary | ICD-10-CM

## 2023-10-12 MED ORDER — LINZESS 72 MCG PO CAPS: 72.0000 ug | ORAL_CAPSULE | Freq: Every day | ORAL | 11 refills | Status: DC

## 2023-10-12 NOTE — Telephone Encounter (Signed)
Prescription Request  10/12/2023  LOV: 09/24/2023  What is the name of the medication or equipment? LINZESS 72 MCG capsule   Have you contacted your pharmacy to request a refill? No   Which pharmacy would you like this sent to?   [id number] - [place], [place] - [address] [place] [address] Phone: [phone] Fax: [phone]    Patient notified that their request is being sent to the clinical staff for review and that they should receive a response within 2 business days.   Please advise at Mobile [phone] (mobile)

## 2023-10-13 LAB — NUSWAB VG PLUS+MYCOPLASMAS,NAA
Candida albicans, NAA: NEGATIVE
Candida glabrata, NAA: NEGATIVE
Chlamydia trachomatis, NAA: NEGATIVE
Mycoplasma genitalium NAA: NEGATIVE
Mycoplasma hominis NAA: NEGATIVE
Neisseria gonorrhoeae, NAA: NEGATIVE
Trich vag by NAA: NEGATIVE
Ureaplasma spp NAA: NEGATIVE

## 2023-10-13 LAB — URINE CULTURE

## 2023-10-14 ENCOUNTER — Telehealth: Payer: Self-pay | Admitting: Nurse Practitioner

## 2023-10-14 ENCOUNTER — Encounter: Payer: Self-pay | Admitting: Family Medicine

## 2023-10-14 ENCOUNTER — Ambulatory Visit (INDEPENDENT_AMBULATORY_CARE_PROVIDER_SITE_OTHER): Payer: 59 | Admitting: Family Medicine

## 2023-10-14 VITALS — BP 126/70 | HR 72 | Temp 98.6°F | Ht 64.0 in | Wt 204.0 lb

## 2023-10-14 DIAGNOSIS — R109 Unspecified abdominal pain: Secondary | ICD-10-CM | POA: Diagnosis not present

## 2023-10-14 NOTE — Telephone Encounter (Signed)
Called and informed pt mother that she can reach out to the pharmacy for further refills due to the fact refills were sent in on 10-12-23 30 tablets with 11 refills

## 2023-10-14 NOTE — Telephone Encounter (Signed)
Patient's mom called and said her daughter is out of one of her medications. The name is LINZESS 72 MCG capsule. [facility] [phone] [place], [place] - [address], [place] [address] Phone: [phone]  Fax: [phone]  Her number is [phone]

## 2023-10-14 NOTE — Patient Instructions (Signed)
No yeast on vaginal swab, no bacteria seen on multiple urine cultures.  Apply coconut oil or olive oil or KY jelly vaginally to moisturize and protect the skin.  Stay away from vaginal washes.  Stop tumeric if causing nausea.  Take linzess daily until regular bowel movement.  Increase fiber and water in diet.  Start daily probiotic like align or culturelle.

## 2023-10-14 NOTE — Progress Notes (Signed)
Patient ID: Jamie Ross, female    DOB: 11/10/1986, 37 y.o.   MRN: [id number]  This visit was conducted in person.  BP 126/70 (BP Location: Left Arm, Patient Position: Sitting, Cuff Size: Large)   Pulse 72   Temp 98.6 F (37 C) (Oral)   Ht 5\' 4"  (1.626 m)   Wt 204 lb (92.5 kg)   SpO2 97%   BMI 35.02 kg/m    CC:  Chief Complaint  Patient presents with   Flank Pain    Bilateral x 1 week    Subjective:    HPI: Jamie Ross is a 37 y.o. female presenting on 10/14/2023 for Flank Pain (Bilateral x 1 week) Saw OB/GYN October 09, 2023 for acute vaginitis, dysuria complaints 10/09/2023 negative urine culture  negative wet prep but raw and irritated.. treated for yeast, fluconazole and cream has not helped.  Itching vaginally.   She has been using summers eve.. now stopped.. only cleaning with water.    In last week she has been  pain in bilaterally sides.     In last month  she has been having BMs, but smaller balls.  She has been  using linzess off and on. In discussion with muscular and patient there is some disagreement to how regular her bowel movements are.  Mom feels strongly that she tends towards constipation and complains of bloating.  The patient is afraid that using Linzess daily will cause too much diarrhea.   She has been treated 2 times for UTI in last month.  But both cultures returned neg 9/20 and 10/17  Relevant past medical, surgical, family and social history reviewed and updated as indicated. Interim medical history since our last visit reviewed. Allergies and medications reviewed and updated. Outpatient Medications Prior to Visit  Medication Sig Dispense Refill   acetaminophen (TYLENOL) 650 MG CR tablet Take 1,300 mg by mouth every 8 (eight) hours as needed.     albuterol (VENTOLIN HFA) 108 (90 Base) MCG/ACT inhaler TAKE 2 PUFFS BY MOUTH EVERY 6 HOURS AS NEEDED FOR WHEEZE OR SHORTNESS OF BREATH 8.5 each 5   amantadine (SYMMETREL) 100 MG  capsule Take 100 mg by mouth 2 (two) times daily.     budesonide-formoterol (SYMBICORT) 160-4.5 MCG/ACT inhaler Inhale 2 puffs into the lungs 2 (two) times daily. 1 Inhaler 0   Cyanocobalamin ([facility] B12 GUMMIES PO) Take by mouth.     cyclobenzaprine (FLEXERIL) 10 MG tablet Take 1 tablet (10 mg total) by mouth 3 (three) times daily as needed for muscle spasms. 90 tablet 0   fexofenadine (ALLEGRA ALLERGY) 180 MG tablet Take 1 tablet (180 mg total) by mouth daily. 30 tablet 11   fluconazole (DIFLUCAN) 150 MG tablet Take 1 tablet (150 mg total) by mouth daily. Take Monday, 72h after 2nd dose. For total of 3 doses each 3 days apart. 1 tablet 0   fluticasone (FLONASE) 50 MCG/ACT nasal spray 1 spray into each nostril daily. 15.8 mL 3   haloperidol (HALDOL) 5 MG tablet Take 0.5 tablets (2.5 mg total) by mouth 2 (two) times daily. 30 tablet 0   ketoconazole (NIZORAL) 2 % cream Apply 1 Application topically daily. Apply externally. 30 g 1   LINZESS 72 MCG capsule Take 1 capsule (72 mcg total) by mouth daily. 30 capsule 11   meloxicam (MOBIC) 15 MG tablet Take 1 tablet (15 mg total) by mouth daily. 90 tablet 3   metoCLOPramide (REGLAN) 10 MG tablet Take 1 tablet (10  mg total) by mouth every 8 (eight) hours as needed for nausea. 30 tablet 0   omeprazole (PRILOSEC) 40 MG capsule Take 40 mg by mouth every morning.     ondansetron (ZOFRAN-ODT) 4 MG disintegrating tablet Take 1 tablet (4 mg total) by mouth every 8 (eight) hours as needed for nausea or vomiting. 30 tablet 5   oxyCODONE (ROXICODONE) 5 MG immediate release tablet Take 1 tablet (5 mg total) by mouth every 4 (four) hours as needed for severe pain. 5 tablet 0   prazosin (MINIPRESS) 2 MG capsule Take 1 capsule (2 mg total) by mouth 2 (two) times daily. 60 capsule 0   propranolol (INDERAL) 10 MG tablet Take 10 mg by mouth 2 (two) times daily.     sertraline (ZOLOFT) 50 MG tablet Take 50 mg by mouth daily.     terconazole (TERAZOL 7) 0.4 % vaginal cream  Place 1 applicator vaginally at bedtime. 45 g 0   topiramate (TOPAMAX) 50 MG tablet Take 100 mg by mouth at bedtime.     traZODone (DESYREL) 50 MG tablet Take 0.5-1 tablets (25-50 mg total) by mouth at bedtime as needed for sleep. 90 tablet 3   fluconazole (DIFLUCAN) 150 MG tablet Take 1 tablet by mouth x 1 dose. Repeat in 72 hours. 2 tablet 0   No facility-administered medications prior to visit.     Per HPI unless specifically indicated in ROS section below Review of Systems  Constitutional:  Negative for fatigue and fever.  HENT:  Negative for congestion.   Eyes:  Negative for pain.  Respiratory:  Negative for cough and shortness of breath.   Cardiovascular:  Negative for chest pain, palpitations and leg swelling.  Gastrointestinal:  Negative for abdominal pain.  Genitourinary:  Negative for dysuria and vaginal bleeding.  Musculoskeletal:  Negative for back pain.  Neurological:  Negative for syncope, light-headedness and headaches.  Psychiatric/Behavioral:  Negative for dysphoric mood.    Objective:  BP 126/70 (BP Location: Left Arm, Patient Position: Sitting, Cuff Size: Large)   Pulse 72   Temp 98.6 F (37 C) (Oral)   Ht 5\' 4"  (1.626 m)   Wt 204 lb (92.5 kg)   SpO2 97%   BMI 35.02 kg/m   Wt Readings from Last 3 Encounters:  10/14/23 204 lb (92.5 kg)  10/09/23 203 lb 12.8 oz (92.4 kg)  10/06/23 206 lb (93.4 kg)      Physical Exam Vitals and nursing note reviewed.  Constitutional:      General: She is not in acute distress.    Appearance: Normal appearance. She is well-developed. She is not ill-appearing or toxic-appearing.  HENT:     Head: Normocephalic.     Right Ear: Hearing, tympanic membrane, ear canal and external ear normal.     Left Ear: Hearing, tympanic membrane, ear canal and external ear normal.     Nose: Nose normal.  Eyes:     General: Lids are normal. Lids are everted, no foreign bodies appreciated.     Conjunctiva/sclera: Conjunctivae normal.      Pupils: Pupils are equal, round, and reactive to light.  Neck:     Thyroid: No thyroid mass or thyromegaly.     Vascular: No carotid bruit.     Trachea: Trachea normal.  Cardiovascular:     Rate and Rhythm: Normal rate and regular rhythm.     Heart sounds: Normal heart sounds, S1 normal and S2 normal. No murmur heard.    No gallop.  Pulmonary:  Effort: Pulmonary effort is normal. No respiratory distress.     Breath sounds: Normal breath sounds. No wheezing, rhonchi or rales.  Abdominal:     General: Bowel sounds are normal. There is no distension or abdominal bruit.     Palpations: Abdomen is soft. There is no fluid wave or mass.     Tenderness: There is no abdominal tenderness. There is no guarding or rebound.     Hernia: No hernia is present.  Musculoskeletal:     Cervical back: Normal range of motion and neck supple.  Lymphadenopathy:     Cervical: No cervical adenopathy.  Skin:    General: Skin is warm and dry.     Findings: No rash.  Neurological:     Mental Status: She is alert.     Cranial Nerves: No cranial nerve deficit.     Sensory: No sensory deficit.  Psychiatric:        Mood and Affect: Mood is not anxious or depressed.        Speech: Speech normal.        Behavior: Behavior normal. Behavior is cooperative.        Judgment: Judgment normal.       Results for orders placed or performed in visit on 10/09/23  Urine Culture   Specimen: Urine   UR  Result Value Ref Range   Urine Culture, Routine Final report    Organism ID, Bacteria Comment   NuSwab VG Plus+Mycoplasmas,NAA   Specimen: Vaginal Swab   CE  Result Value Ref Range   Atopobium vaginae Low - 0 Score   BVAB 2 Low - 0 Score   Megasphaera 1 Low - 0 Score   Candida albicans, NAA Negative Negative   Candida glabrata, NAA Negative Negative   Trich vag by NAA Negative Negative   Chlamydia trachomatis, NAA Negative Negative   Neisseria gonorrhoeae, NAA Negative Negative   Mycoplasma genitalium NAA  Negative Negative   Mycoplasma hominis NAA Negative Negative   Ureaplasma spp NAA Negative Negative    Assessment and Plan  There are no diagnoses linked to this encounter.  No follow-ups on file.   Kerby Nora, MD

## 2023-10-15 ENCOUNTER — Encounter: Payer: Self-pay | Admitting: Emergency Medicine

## 2023-10-15 ENCOUNTER — Other Ambulatory Visit: Payer: Self-pay

## 2023-10-15 ENCOUNTER — Emergency Department: Payer: 59

## 2023-10-15 ENCOUNTER — Emergency Department
Admission: EM | Admit: 2023-10-15 | Discharge: 2023-10-15 | Disposition: A | Discharge status: Home / Self Care | Payer: 59 | Attending: Emergency Medicine | Admitting: Emergency Medicine

## 2023-10-15 ENCOUNTER — Telehealth: Payer: Self-pay | Admitting: Internal Medicine

## 2023-10-15 DIAGNOSIS — R0789 Other chest pain: Secondary | ICD-10-CM | POA: Insufficient documentation

## 2023-10-15 DIAGNOSIS — D72829 Elevated white blood cell count, unspecified: Secondary | ICD-10-CM | POA: Diagnosis not present

## 2023-10-15 DIAGNOSIS — I10 Essential (primary) hypertension: Secondary | ICD-10-CM | POA: Insufficient documentation

## 2023-10-15 DIAGNOSIS — J45909 Unspecified asthma, uncomplicated: Secondary | ICD-10-CM | POA: Diagnosis not present

## 2023-10-15 LAB — BASIC METABOLIC PANEL
Anion gap: 8 (ref 5–15)
BUN: 10 mg/dL (ref 6–20)
CO2: 23 mmol/L (ref 22–32)
Calcium: 8.9 mg/dL (ref 8.9–10.3)
Chloride: 103 mmol/L (ref 98–111)
Creatinine, Ser: 0.71 mg/dL (ref 0.44–1.00)
GFR, Estimated: >60 mL/min (ref >60)
Glucose, Bld: 95 mg/dL (ref 70–99)
Potassium: 3.5 mmol/L (ref 3.5–5.1)
Sodium: 134 mmol/L — ABNORMAL LOW (ref 135–145)

## 2023-10-15 LAB — CBC
HCT: 39.1 % (ref 36.0–46.0)
Hemoglobin: 13.4 g/dL (ref 12.0–15.0)
MCH: 32.9 pg (ref 26.0–34.0)
MCHC: 34.3 g/dL (ref 30.0–36.0)
MCV: 96.1 fL (ref 80.0–100.0)
Platelets: 339 10*3/uL (ref 150–400)
RBC: 4.07 MIL/uL (ref 3.87–5.11)
RDW: 13.5 % (ref 11.5–15.5)
WBC: 12.8 10*3/uL — ABNORMAL HIGH (ref 4.0–10.5)
nRBC: 0 % (ref 0.0–0.2)

## 2023-10-15 LAB — HEPATIC FUNCTION PANEL
ALT: 12 U/L (ref 0–44)
AST: 11 U/L — ABNORMAL LOW (ref 15–41)
Albumin: 3.8 g/dL (ref 3.5–5.0)
Alkaline Phosphatase: 116 U/L (ref 38–126)
Bilirubin, Direct: <0.1 mg/dL (ref 0.0–0.2)
Total Bilirubin: 0.5 mg/dL (ref <1.2)
Total Protein: 7.2 g/dL (ref 6.5–8.1)

## 2023-10-15 LAB — TROPONIN I (HIGH SENSITIVITY)
Troponin I (High Sensitivity): 2 ng/L (ref <18)
Troponin I (High Sensitivity): 3 ng/L (ref <18)

## 2023-10-15 LAB — D-DIMER, QUANTITATIVE: D-Dimer, Quant: 0.27 ug{FEU}/mL (ref 0.00–0.50)

## 2023-10-15 LAB — LIPASE, BLOOD: Lipase: 27 U/L (ref 11–51)

## 2023-10-15 LAB — HCG, QUANTITATIVE, PREGNANCY: hCG, Beta Chain, Quant, S: 1 m[IU]/mL (ref <5)

## 2023-10-15 MED ORDER — LORAZEPAM 2 MG/ML IJ SOLN
0.5000 mg | Freq: Once | INTRAMUSCULAR | Status: AC
  Administered 2023-10-15: 0.5 mg via INTRAVENOUS
  Filled 2023-10-15: qty 1

## 2023-10-15 MED ORDER — IOHEXOL 350 MG/ML SOLN
100.0000 mL | Freq: Once | INTRAVENOUS | Status: AC | PRN
Start: 2023-10-15 — End: 2023-10-15
  Administered 2023-10-15: 100 mL via INTRAVENOUS

## 2023-10-15 MED ORDER — KETOROLAC TROMETHAMINE 15 MG/ML IJ SOLN
15.0000 mg | Freq: Once | INTRAMUSCULAR | Status: AC
  Administered 2023-10-15: 15 mg via INTRAVENOUS
  Filled 2023-10-15: qty 1

## 2023-10-15 MED ORDER — LIDOCAINE 5 % EX PTCH
1.0000 | MEDICATED_PATCH | CUTANEOUS | Status: DC
  Administered 2023-10-15: 1 via TRANSDERMAL
  Filled 2023-10-15: qty 1

## 2023-10-15 MED ORDER — ALUM & MAG HYDROXIDE-SIMETH 200-200-20 MG/5ML PO SUSP
30.0000 mL | Freq: Once | ORAL | Status: AC
  Administered 2023-10-15: 30 mL via ORAL
  Filled 2023-10-15: qty 30

## 2023-10-15 MED ORDER — PANTOPRAZOLE SODIUM 40 MG IV SOLR
40.0000 mg | Freq: Once | INTRAVENOUS | Status: AC
  Administered 2023-10-15: 40 mg via INTRAVENOUS
  Filled 2023-10-15: qty 10

## 2023-10-15 NOTE — Telephone Encounter (Signed)
Left a message for the patient to call back.  

## 2023-10-15 NOTE — ED Triage Notes (Signed)
Pt via POV from home. Pt c/o L sided chest pain and SOB for the past 3 days. Pt has a hx of "leaky valve" reports that she has an appointment with cardiology on Nov 20th. Pt is A&Ox4 and NAD

## 2023-10-15 NOTE — Discharge Instructions (Signed)
She can take Tylenol 1 g every 8 hours, ibuprofen 600 every 6-8 hours with food for the next 1 week to try to help with her pain.  Please call her psychiatrist and her primary care doctor to make a follow-up appointment.

## 2023-10-15 NOTE — ED Provider Notes (Signed)
[facility] Provider Note    Event Date/Time   First MD Initiated Contact with Patient 10/15/23 1503     (approximate)   History   Chest Pain and Shortness of Breath   HPI  Jamie Ross is a 37 y.o. female with hypertension, asthma, intellectual disability, PTSD, anxiety, depression, and ADHD who comes in with chest pain and shortness of breath.  Patient reports some chest pain.  She states the chest pain is over her heart but then she points to the right side of her chest.  When I stated that her heart was more on the left side she stated that was where her pain was at.  Given her intellectual disability it is hard to get a great understanding of where the pain is at but it does sound like she has had this pain previously and she has been seen by cardiology for it.  She denies any abdominal pain.  Family reports that she has been endorsing the pain for the past 2 days and has been making it difficult for her to sleep at night.  She did report that it comes and goes.  Denies any new swelling in the legs.   I reviewed the note from 09/23/2023 where patient has had some chest pain seen by Dr. Andria Rhein underwent echocardiogram on 10/06/2023 and noted to have some mild mitral valve regurgitation.  Physical Exam   Triage Vital Signs: ED Triage Vitals  Encounter Vitals Group     BP 10/15/23 1347 (!) 146/81     Systolic BP Percentile --      Diastolic BP Percentile --      Pulse Rate 10/15/23 1347 81     Resp 10/15/23 1347 18     Temp 10/15/23 1347 97.7 F (36.5 C)     Temp src --      SpO2 10/15/23 1347 97 %     Weight 10/15/23 1244 198 lb (89.8 kg)     Height 10/15/23 1244 5\' 4"  (1.626 m)     Head Circumference --      Peak Flow --      Pain Score 10/15/23 1244 10     Pain Loc --      Pain Education --      Exclude from Growth Chart --     Most recent vital signs: Vitals:   10/15/23 1347  BP: (!) 146/81  Pulse: 81  Resp: 18  Temp: 97.7 F  (36.5 C)  SpO2: 97%     General: Awake, no distress.  CV:  Good peripheral perfusion.  No rash Resp:  Normal effort.  Abd:  No distention.  Soft nontender Other:  No swelling in legs.  No calf tenderness.   ED Results / Procedures / Treatments   Labs (all labs ordered are listed, but only abnormal results are displayed) Labs Reviewed  BASIC METABOLIC PANEL - Abnormal; Notable for the following components:      Result Value   Sodium 134 (*)    All other components within normal limits  CBC - Abnormal; Notable for the following components:   WBC 12.8 (*)    All other components within normal limits  POC URINE PREG, ED  TROPONIN I (HIGH SENSITIVITY)  TROPONIN I (HIGH SENSITIVITY)     EKG  My interpretation of EKG:  Normal sinus rate of 83 without any ST elevation or T wave inversions, QT of 441  RADIOLOGY I have reviewed the xray personally  and interpreted no evidence of any pneumonia  PROCEDURES:  Critical Care performed: No  Procedures   MEDICATIONS ORDERED IN ED: Medications  lidocaine (LIDODERM) 5 % 1 patch (1 patch Transdermal Patch Applied 10/15/23 1652)  ketorolac (TORADOL) 15 MG/ML injection 15 mg (15 mg Intravenous Given 10/15/23 1652)     IMPRESSION / MDM / ASSESSMENT AND PLAN / ED COURSE  I reviewed the triage vital signs and the nursing notes.   Patient's presentation is most consistent with acute presentation with potential threat to life or bodily function.   Patient comes in with chest pain.  She has report of "leaky valve" but I reviewed the echocardiogram and she has a little bit of regurgitation.  Blood work was ordered evaluate for ACS, PE, gallbladder pathology just given her history is limited however on exam she does not seem to have any abdominal tenderness or signs of DVT.  Patient was given some Toradol and lidocaine patch to help with pain  Troponin was negative.  BMP reassuring CBC shows slightly elevated white count  D-dimer is  negative, patient is still crying in significant pain.  Will get CT scan to rule out any other acute pathology again due to limited history given patient's intellectual disability.  IMPRESSION: No evidence of aortic aneurysm or dissection.  No pulmonary embolus.   No acute findings in the chest, abdomen or pelvis.    Reevaluated patient she still reporting some discomforts not getting better with GI cocktail.  I had a lengthy discussion with the family and that her workup was otherwise been very reassuring for emergent conditions but she may want to follow-up with her psychiatry team to discuss her medications.  It sounds that this pain has been ongoing off and on since a couple of weeks at this time I do not feel there is an emergent pathology and they feel comfortable following up outpatient with their PCP, psychiatrist.  The patient is on the cardiac monitor to evaluate for evidence of arrhythmia and/or significant heart rate changes.      FINAL CLINICAL IMPRESSION(S) / ED DIAGNOSES   Final diagnoses:  Atypical chest pain     Rx / DC Orders   ED Discharge Orders     None        Note:  This document was prepared using Dragon voice recognition software and may include unintentional dictation errors.   Concha Se, MD 10/15/23 2141

## 2023-10-15 NOTE — Telephone Encounter (Signed)
I spoke with the patient's mother, who reported that the patient has been experiencing constant chest pain for the past two days. She stated that the patient describes the pain as sharp and heavy. The patient is currently experiencing pain. The mother also reported that the patient is feeling lightheaded.  Based on the current symptoms, the nurse recommended that the patient go to the ER for further evaluation. The patient's mother verbalized understanding.

## 2023-10-15 NOTE — Telephone Encounter (Signed)
   Pt c/o of Chest Pain: STAT if active CP, including tightness, pressure, jaw pain, radiating pain to shoulder/upper arm/back, CP unrelieved by Nitro. Symptoms reported of SOB, nausea, vomiting, sweating.  1. Are you having CP right now? Yes  2. Are you experiencing any other symptoms (ex. SOB, nausea, vomiting, sweating)? Sweating (may be because of the weather)   3. Is your CP continuous or coming and going? Continuous   4. Have you taken Nitroglycerin?    5. How long have you been experiencing CP? Started 1-2 days ago   6. If NO CP at time of call then end call with telling Pt to call back or call 911 if Chest pain returns prior to return call from triage team.

## 2023-10-16 ENCOUNTER — Telehealth: Payer: Self-pay | Admitting: Nurse Practitioner

## 2023-10-16 ENCOUNTER — Ambulatory Visit: Payer: No Typology Code available for payment source | Admitting: Nurse Practitioner

## 2023-10-16 NOTE — Telephone Encounter (Signed)
Pt mom called thinking the pt has a high hernia in her upper chest. Pt mom stated the pt went to ER because she was hurting in her chest. They ran test on her heart and they did not see anything.

## 2023-10-16 NOTE — Telephone Encounter (Signed)
Called and informed mother and she stated she wonders if its a bad case of GERD, she states pt is not sleeping to well because of the pain. I have advised pts mother to take her to ED or Urgent care if things get worst I informed her she should not wait until next week to be seen. She verbalized understanding.

## 2023-10-22 NOTE — Assessment & Plan Note (Signed)
Acute, unclear etiology of flank pain but does not seem to be related to urinary tract infection.  Urinalysis clear today.  Last 2 urinary cultures sent returned negative although patient was treated twice with 2 different antibiotics. I wonder if bacterial overgrowth and imbalance is causing bloating and abdominal pain referred to her flanks.  Recommend Linzess more frequently to help with constipation, start pre and probiotic to help with bacterial imbalance and GI tract.  Continue water and fiber in diet.  She will return to see her PCP who knows her better for further evaluation if she is not improving.

## 2023-10-28 ENCOUNTER — Encounter: Payer: Self-pay | Admitting: Internal Medicine

## 2023-10-28 ENCOUNTER — Ambulatory Visit: Payer: 59 | Attending: Internal Medicine | Admitting: Internal Medicine

## 2023-10-28 VITALS — BP 109/76 | HR 66 | Ht 64.0 in | Wt 204.0 lb

## 2023-10-28 DIAGNOSIS — I10 Essential (primary) hypertension: Secondary | ICD-10-CM | POA: Diagnosis not present

## 2023-10-28 DIAGNOSIS — R079 Chest pain, unspecified: Secondary | ICD-10-CM

## 2023-10-28 NOTE — Progress Notes (Signed)
Cardiology Office Note:  .   Date:  10/28/2023  ID:  Jamie Ross, DOB Aug 14, 1986, MRN [id number] PCP: Bethanie Dicker, NP  [facility] Providers Cardiologist:  Yvonne Kendall, MD     History of Present Illness: Jamie Ross   [name] is a 37 y.o. female with history of hypertension, asthma, PTSD, anxiety, depression, and ADHD, who presents for follow-up of chest pain.  I met her a month ago for evaluation of atypical chest pain present with deep inspiration.  It began around the time she also developed subjective fevers, chills, nonproductive cough, and vomiting.  She had been since the ED twice over the preceding 4-6 weeks and was diagnosed with URI and acute cystitis.  We agreed to check labs and an echocardiogram to assess for findings of acute pericarditis.  Echo and labs (CBC, CMP, ESR, and CRP) were unremarkable.  She was diagnosed with acute vaginitis the next day by her PCP.  She saw Dr. Smith Robert (oncology) for assessment of mild neutrophilia that was felt to be reactive; further intervention was not recommended.  She presented to the ED on 10/15/2023 with chest pain and shortness of breath.  Labs were normal, including troponin and d-dimer.  CTA chest, abdomen, and pelvis did not show any acute findings.  No coronary artery calcium is evident.  Today, Jamie Ross has multiple complains including continued chest pain worsened with deep inspiration, dyspnea on exertion, alternating hot and cold flashes, diaphoresis, abdominal pain and nausea.  She notes minimal improvement with BC powders but no relief from acetaminophen, ibuprofen, or naproxen.  She and her family recently spoke with a pharmacist, who suggested that some of her symptoms could be medication side effects, particularly from the topiramate.  [name] family now believes that all of her symptoms began around the time that this medication was started.  They are planning to speak with her psychiatrist at next week's  visit about this.  ROS: See HPI  Studies Reviewed: Jamie Ross   EKG Interpretation Date/Time:  Wednesday October 28 2023 14:25:36 EST Ventricular Rate:  66 PR Interval:  144 QRS Duration:  86 QT Interval:  402 QTC Calculation: 421 R Axis:   11  Text Interpretation: Normal sinus rhythm Normal ECG When compared with ECG of 15-Oct-2023 12:39, No significant change was found Confirmed by Abbagail Scaff, Cristal Deer [phone]) on 10/28/2023 2:28:59 PM    TTE (10/06/2023): Normal LV size and wall thickness.  LVEF 60-65% with normal wall motion and diastolic parameters.  Normal RV size and function.  Normal PA pressure.  Normal biatrial size.  Structurally normal mitral valve with mild regurgitation.  Mild tricuspid regurgitation.  Normal CVP.  Risk Assessment/Calculations:             Physical Exam:   VS:  BP 109/76 (BP Location: Left Arm, Patient Position: Sitting, Cuff Size: Normal)   Pulse 66   Ht 5\' 4"  (1.626 m)   Wt 204 lb (92.5 kg)   SpO2 97%   BMI 35.02 kg/m    Wt Readings from Last 3 Encounters:  10/28/23 204 lb (92.5 kg)  10/15/23 198 lb (89.8 kg)  10/14/23 204 lb (92.5 kg)    General:  NAD. Neck: No JVD or HJR. Lungs: Clear to auscultation bilaterally without wheezes or crackles. Heart: Regular rate and rhythm without murmurs, rubs, or gallops.  Chest pain is reproducible with palpation of the sternum. Abdomen: Soft with mild diffuse tenderness. Extremities: No lower extremity edema.  ASSESSMENT AND PLAN: .  Chest pain: Pain remains atypical, predominantly right-sided though Jamie Ross also states it "hurts over my heart" at times.  It seems to be precipitated by deep inspiration.  Workup thus far, including echo, multiple labs, and CTA chest, abdomen, and pelvis, has been unremarkable other than mild MR that is unlikely to be contributing to her constellation of symptoms.  My suspicion for ischemic heart disease is low given the patient's young age, normal LDL, absence of coronary  artery calcium, normal EKG's and negative troponins.  We discussed pursuing ischemia evaluation to exclude this but have agreed to see if her symptoms improve with discontinuation of topiramate, which Jamie Ross plans to discuss with her mental health provider at her upcoming appointment.  Hypertension: BP well-controlled today.  Continue prazosin, which is likely also being used for PTSD/nightmares.    Dispo: Return to clinic in 1 month to reassess chest pain and purse ischemia evaluation (likely coronary CTA) if no improvement with deescalation/discontinuation of topiramate.  Signed, Yvonne Kendall, MD

## 2023-10-28 NOTE — Patient Instructions (Addendum)
Medication Instructions:  Your physician recommends that you continue on your current medications as directed. Please refer to the Current Medication list given to you today.   Dr. Okey Dupre recommends talking with you physiatrics regarding discontinuing topamax.  *If you need a refill on your cardiac medications before your next appointment, please call your pharmacy*   Lab Work: No labs ordered today    Testing/Procedures: No test ordered today    Follow-Up: At [facility], you and your health needs are our priority.  As part of our continuing mission to provide you with exceptional heart care, we have created designated Provider Care Teams.  These Care Teams include your primary Cardiologist (physician) and Advanced Practice Providers (APPs -  Physician Assistants and Nurse Practitioners) who all work together to provide you with the care you need, when you need it.  We recommend signing up for the patient portal called "MyChart".  Sign up information is provided on this After Visit Summary.  MyChart is used to connect with patients for Virtual Visits (Telemedicine).  Patients are able to view lab/test results, encounter notes, upcoming appointments, etc.  Non-urgent messages can be sent to your provider as well.   To learn more about what you can do with MyChart, go to [email or web address].    Your next appointment:   1 month(s)  Provider:   You may see Yvonne Kendall, MD or one of the following Advanced Practice Providers on your designated Care Team:   Nicolasa Ducking, NP Eula Listen, PA-C Cadence Fransico Michael, PA-C Charlsie Quest, NP Carlos Levering, NP

## 2023-11-01 ENCOUNTER — Other Ambulatory Visit: Payer: Self-pay | Admitting: Nurse Practitioner

## 2023-11-01 DIAGNOSIS — R110 Nausea: Secondary | ICD-10-CM

## 2023-11-03 ENCOUNTER — Encounter (INDEPENDENT_AMBULATORY_CARE_PROVIDER_SITE_OTHER): Payer: 59 | Admitting: Ophthalmology

## 2023-11-03 DIAGNOSIS — H3581 Retinal edema: Secondary | ICD-10-CM

## 2023-11-04 NOTE — Addendum Note (Signed)
Encounter addended by: Silvana Newness on: 11/04/2023 8:37 AM  Actions taken: Imaging Exam ended

## 2023-11-17 ENCOUNTER — Ambulatory Visit: Payer: 59 | Admitting: Family

## 2023-11-17 ENCOUNTER — Encounter: Payer: Self-pay | Admitting: Family

## 2023-11-17 VITALS — BP 136/78 | HR 71 | Temp 98.1°F | Ht 64.0 in | Wt 208.2 lb

## 2023-11-17 DIAGNOSIS — H6993 Unspecified Eustachian tube disorder, bilateral: Secondary | ICD-10-CM

## 2023-11-17 MED ORDER — AZELASTINE HCL 0.1 % NA SOLN: 1.0000 | Freq: Two times a day (BID) | NASAL | 1 refills | Status: DC

## 2023-11-17 NOTE — Patient Instructions (Signed)
Since you are on mobic 15mg  which is a potent anti-inflammatory.  Do not take additional anti-inflammatory such as ibuprofen as this can cause stomach pain, stomach ulcer. You may continue Tylenol 1500 mg total daily  Concern possible component of TMJ syndrome.  Please discuss with your dentist if you have any teeth grinding.  For eustachian tube dysfunction, you may start azelastine nasal spray which is an antihistamine.   Eustachian Tube Dysfunction  Eustachian tube dysfunction refers to a condition in which a blockage develops in the narrow passage that connects the middle ear to the back of the nose (eustachian tube). The eustachian tube regulates air pressure in the middle ear by letting air move between the ear and nose. It also helps to drain fluid from the middle ear space. Eustachian tube dysfunction can affect one or both ears. When the eustachian tube does not function properly, air pressure, fluid, or both can build up in the middle ear. What are the causes? This condition occurs when the eustachian tube becomes blocked or cannot open normally. Common causes of this condition include: Ear infections. Colds and other infections that affect the nose, mouth, and throat (upper respiratory tract). Allergies. Irritation from cigarette smoke. Irritation from stomach acid coming up into the esophagus (gastroesophageal reflux). The esophagus is the part of the body that moves food from the mouth to the stomach. Sudden changes in air pressure, such as from descending in an airplane or scuba diving. Abnormal growths in the nose or throat, such as: Growths that line the nose (nasal polyps). Abnormal growth of cells (tumors). Enlarged tissue at the back of the throat (adenoids). What increases the risk? You are more likely to develop this condition if: You smoke. You are overweight. You are a child who has: Certain birth defects of the mouth, such as cleft palate. Large tonsils or  adenoids. What are the signs or symptoms? Common symptoms of this condition include: A feeling of fullness in the ear. Ear pain. Clicking or popping noises in the ear. Ringing in the ear (tinnitus). Hearing loss. Loss of balance. Dizziness. Symptoms may get worse when the air pressure around you changes, such as when you travel to an area of high elevation, fly on an airplane, or go scuba diving. How is this diagnosed? This condition may be diagnosed based on: Your symptoms. A physical exam of your ears, nose, and throat. Tests, such as those that measure: The movement of your eardrum. Your hearing (audiometry). How is this treated? Treatment depends on the cause and severity of your condition. In mild cases, you may relieve your symptoms by moving air into your ears. This is called "popping the ears." In more severe cases, or if you have symptoms of fluid in your ears, treatment may include: Medicines to relieve congestion (decongestants). Medicines that treat allergies (antihistamines). Nasal sprays or ear drops that contain medicines that reduce swelling (steroids). A procedure to drain the fluid in your eardrum. In this procedure, a small tube may be placed in the eardrum to: Drain the fluid. Restore the air in the middle ear space. A procedure to insert a balloon device through the nose to inflate the opening of the eustachian tube (balloon dilation). Follow these instructions at home: Lifestyle Do not do any of the following until your health care provider approves: Travel to high altitudes. Fly in airplanes. Work in a Estate agent or room. Scuba dive. Do not use any products that contain nicotine or tobacco. These products include cigarettes,  chewing tobacco, and vaping devices, such as e-cigarettes. If you need help quitting, ask your health care provider. Keep your ears dry. Wear fitted earplugs during showering and bathing. Dry your ears completely after. General  instructions Take over-the-counter and prescription medicines only as told by your health care provider. Use techniques to help pop your ears as recommended by your health care provider. These may include: Chewing gum. Yawning. Frequent, forceful swallowing. Closing your mouth, holding your nose closed, and gently blowing as if you are trying to blow air out of your nose. Keep all follow-up visits. This is important. Contact a health care provider if: Your symptoms do not go away after treatment. Your symptoms come back after treatment. You are unable to pop your ears. You have: A fever. Pain in your ear. Pain in your head or neck. Fluid draining from your ear. Your hearing suddenly changes. You become very dizzy. You lose your balance. Get help right away if: You have a sudden, severe increase in any of your symptoms. Summary Eustachian tube dysfunction refers to a condition in which a blockage develops in the eustachian tube. It can be caused by ear infections, allergies, inhaled irritants, or abnormal growths in the nose or throat. Symptoms may include ear pain or fullness, hearing loss, or ringing in the ears. Mild cases are treated with techniques to unblock the ears, such as yawning or chewing gum. More severe cases are treated with medicines or procedures. This information is not intended to replace advice given to you by your health care provider. Make sure you discuss any questions you have with your health care provider. Document Revised: 02/04/2021 Document Reviewed: 02/04/2021 Elsevier Patient Education  2024 [employer].    Temporomandibular Joint Syndrome  Temporomandibular joint syndrome (TMJ syndrome) is a condition that causes pain in the temporomandibular joints. These joints are located near your ears and allow your jaw to open and close. For people with TMJ syndrome, chewing, biting, or other movements of the jaw can be difficult or painful. TMJ syndrome is  often mild and goes away within a few weeks. However, sometimes the condition becomes a long-term (chronic) problem. What are the causes? This condition may be caused by: Grinding your teeth or clenching your jaw. Some people do this when they are stressed. Arthritis. An injury to the jaw. A head or neck injury. Teeth or dentures that are not aligned well. In some cases, the cause of TMJ syndrome may not be known. What are the signs or symptoms? The most common symptom of this condition is aching pain on the side of the head in the area of the TMJ. Other symptoms may include: Pain when moving your jaw, such as when chewing or biting. Not being able to open your jaw all the way. Making a clicking sound when you open your mouth. Headache. Earache. Neck or shoulder pain. How is this diagnosed? This condition may be diagnosed based on: Your symptoms and medical history. A physical exam. Your health care provider may check the range of motion of your jaw. Imaging tests, such as X-rays or an MRI. You may also need to see your dentist, who will check if your teeth and jaw are lined up correctly. How is this treated? TMJ syndrome often goes away on its own. If treatment is needed, it may include: Eating soft foods and applying ice or heat. Medicines to relieve pain or inflammation. Medicines or massage to relax the muscles. A splint, bite plate, or mouthpiece to  prevent teeth grinding or jaw clenching. Relaxation techniques or counseling to help reduce stress. A therapy for pain in which an electrical current is applied to the nerves through the skin (transcutaneous electrical nerve stimulation). Acupuncture. This may help to relieve pain. Jaw surgery. This is rarely needed. Follow these instructions at home:  Eating and drinking Eat a soft diet if you are having trouble chewing. Avoid foods that require a lot of chewing. Do not chew gum. General instructions Take over-the-counter and  prescription medicines only as told by your health care provider. If directed, put ice on the painful area. To do this: Put ice in a plastic bag. Place a towel between your skin and the bag. Leave the ice on for 20 minutes, 2-3 times a day. Remove the ice if your skin turns bright red. This is very important. If you cannot feel pain, heat, or cold, you have a greater risk of damage to the area. Apply a warm, wet cloth (warm compress) to the painful area as told. Massage your jaw area and do any jaw stretching exercises as told by your health care provider. If you were given a splint, bite plate, or mouthpiece, wear it as told by your health care provider. Keep all follow-up visits. This is important. Where to find more information [employer]: WirelessBots.co.za Contact a health care provider if: You have trouble eating. You have new or worsening symptoms. Get help right away if: Your jaw locks. Summary Temporomandibular joint syndrome (TMJ syndrome) is a condition that causes pain in the temporomandibular joints. These joints are located near your ears and allow your jaw to open and close. TMJ syndrome is often mild and goes away within a few weeks. However, sometimes the condition becomes a long-term (chronic) problem. Symptoms include an aching pain on the side of the head in the area of the TMJ, pain when chewing or biting, and being unable to open your jaw all the way. You may also make a clicking sound when you open your mouth. TMJ syndrome often goes away on its own. If treatment is needed, it may include medicines to relieve pain, reduce inflammation, or relax the muscles. A splint, bite plate, or mouthpiece may also be used to prevent teeth grinding or jaw clenching. This information is not intended to replace advice given to you by your health care provider. Make sure you discuss any questions you have with your health care provider. Document  Revised: 07/07/2021 Document Reviewed: 07/07/2021 Elsevier Patient Education  2024 [employer].

## 2023-11-17 NOTE — Assessment & Plan Note (Signed)
Presentation consistent with viral versus allergic eustachian tube dysfunction.  Duration of symptoms 3 days.  Advised to start azelastine continue Flonase, Allegra over-the-counter.  Counseled patient on not taking 2 different NSAIDs.  Advised her to stop over-the-counter ibuprofen and to be maintained on meloxicam 15 mg every day as prescribed by PCP.  Advise she may continue Tylenol 1500mg  daily.  Advised her to discuss with her dentist in regards to any evidence of teeth grinding.  Consider TMJ if pain does not improve with azelastine.  Patient may need to consider pain management consult.

## 2023-11-17 NOTE — Progress Notes (Signed)
Assessment & Plan:  Eustachian tube dysfunction, bilateral Assessment & Plan: Presentation consistent with viral versus allergic eustachian tube dysfunction.  Duration of symptoms 3 days.  Advised to start azelastine continue Flonase, Allegra over-the-counter.  Counseled patient on not taking 2 different NSAIDs.  Advised her to stop over-the-counter ibuprofen and to be maintained on meloxicam 15 mg every day as prescribed by PCP.  Advise she may continue Tylenol 1500mg  daily.  Advised her to discuss with her dentist in regards to any evidence of teeth grinding.  Consider TMJ if pain does not improve with azelastine.  Patient may need to consider pain management consult.   Orders: -     Azelastine HCl; Place 1 spray into both nostrils 2 (two) times daily. Use in each nostril as directed  Dispense: 30 mL; Refill: 1     Return precautions given.   Risks, benefits, and alternatives of the medications and treatment plan prescribed today were discussed, and patient expressed understanding.   Education regarding symptom management and diagnosis given to patient on AVS either electronically or printed.  Return if symptoms worsen or fail to improve.  Rennie Plowman, FNP  Subjective:    Patient ID: Jamie Ross, female    DOB: [birth year], 37 y.o.   MRN: [id number]  CC: Jamie Ross is a 37 y.o. female who presents today for an acute visit.    HPI: Accompanied by mother Complains of sneezing, right ear pain x 3 days, worsening  She has occasional pain in right jaw with chewing.   Occasional dry cough which is not unusual for her allergies.   No fever, sob, nasal congestion, numbness  No oral pain. She wears a retainers at night. No known h/o teeth grinding.   She is taking flonase, allegra, tylenol 1500mg  every day, ibuprofen 600mg  QD , mobic 15 mg without relief.     H/o gerd, allergic rhinitis  No h/o ckd  Allergies: Doxycycline, Medroxyprogesterone,  Methylprednisolone, Penicillins, Risperidone, Risperidone and related, and Septra [sulfamethoxazole-trimethoprim] Current Outpatient Medications on File Prior to Visit  Medication Sig Dispense Refill   acetaminophen (TYLENOL) 650 MG CR tablet Take 1,300 mg by mouth every 8 (eight) hours as needed.     albuterol (VENTOLIN HFA) 108 (90 Base) MCG/ACT inhaler TAKE 2 PUFFS BY MOUTH EVERY 6 HOURS AS NEEDED FOR WHEEZE OR SHORTNESS OF BREATH 8.5 each 5   amantadine (SYMMETREL) 100 MG capsule Take 100 mg by mouth 2 (two) times daily.     budesonide-formoterol (SYMBICORT) 160-4.5 MCG/ACT inhaler Inhale 2 puffs into the lungs 2 (two) times daily. 1 Inhaler 0   Cyanocobalamin ([facility] B12 GUMMIES PO) Take by mouth.     fexofenadine (ALLEGRA ALLERGY) 180 MG tablet Take 1 tablet (180 mg total) by mouth daily. 30 tablet 11   fluticasone (FLONASE) 50 MCG/ACT nasal spray 1 spray into each nostril daily. 15.8 mL 3   haloperidol (HALDOL) 5 MG tablet Take 0.5 tablets (2.5 mg total) by mouth 2 (two) times daily. 30 tablet 0   hydrOXYzine (ATARAX) 25 MG tablet Take 12.5-25 mg by mouth 2 (two) times daily.     LINZESS 72 MCG capsule Take 1 capsule (72 mcg total) by mouth daily. 30 capsule 11   meloxicam (MOBIC) 15 MG tablet Take 1 tablet (15 mg total) by mouth daily. 90 tablet 3   metoCLOPramide (REGLAN) 10 MG tablet TAKE 1 TABLET (10 MG TOTAL) BY MOUTH EVERY 8 (EIGHT) HOURS AS NEEDED FOR NAUSEA. 30 tablet 2  omeprazole (PRILOSEC) 40 MG capsule Take 40 mg by mouth every morning.     prazosin (MINIPRESS) 5 MG capsule Take 5 mg by mouth at bedtime.     propranolol (INDERAL) 10 MG tablet Take 10 mg by mouth 2 (two) times daily.     sertraline (ZOLOFT) 100 MG tablet Take 100 mg by mouth daily.     terconazole (TERAZOL 7) 0.4 % vaginal cream Place 1 applicator vaginally at bedtime. 45 g 0   traZODone (DESYREL) 50 MG tablet Take 0.5-1 tablets (25-50 mg total) by mouth at bedtime as needed for sleep. 90 tablet 3    [DISCONTINUED] hydrochlorothiazide (MICROZIDE) 12.5 MG capsule Take 12.5 mg by mouth daily.     [DISCONTINUED] potassium chloride SA (K-DUR,KLOR-CON) 20 MEQ tablet Take 20 mEq by mouth daily.     No current facility-administered medications on file prior to visit.    Review of Systems  Constitutional:  Negative for chills and fever.  HENT:  Positive for ear pain, sinus pressure and sneezing. Negative for ear discharge, sore throat and trouble swallowing.   Respiratory:  Positive for cough (occassional).   Cardiovascular:  Negative for chest pain and palpitations.  Gastrointestinal:  Negative for nausea and vomiting.      Objective:    BP 136/78   Pulse 71   Temp 98.1 F (36.7 C) (Oral)   Ht 5\' 4"  (1.626 m)   Wt 208 lb 3.2 oz (94.4 kg)   SpO2 95%   BMI 35.74 kg/m   BP Readings from Last 3 Encounters:  11/17/23 136/78  10/28/23 109/76  10/15/23 124/69   Wt Readings from Last 3 Encounters:  11/17/23 208 lb 3.2 oz (94.4 kg)  10/28/23 204 lb (92.5 kg)  10/15/23 198 lb (89.8 kg)    Physical Exam Vitals reviewed.  Constitutional:      Appearance: She is well-developed.  HENT:     Head: Normocephalic and atraumatic.     Jaw: No trismus, tenderness, swelling, pain on movement or malocclusion.     Right Ear: Hearing, tympanic membrane, ear canal and external ear normal. No decreased hearing noted. No drainage, swelling or tenderness. No middle ear effusion. No foreign body. Tympanic membrane is not erythematous or bulging.     Left Ear: Hearing, tympanic membrane, ear canal and external ear normal. No decreased hearing noted. No drainage, swelling or tenderness.  No middle ear effusion. No foreign body. Tympanic membrane is not erythematous or bulging.     Nose: No rhinorrhea.     Right Sinus: Maxillary sinus tenderness present. No frontal sinus tenderness.     Left Sinus: Maxillary sinus tenderness present. No frontal sinus tenderness.     Comments: Mild diffuse tenderness  over maxillary sinuses. No edema.     Mouth/Throat:     Pharynx: Uvula midline. No oropharyngeal exudate or posterior oropharyngeal erythema.     Tonsils: No tonsillar abscesses.  Eyes:     Conjunctiva/sclera: Conjunctivae normal.  Cardiovascular:     Rate and Rhythm: Regular rhythm.     Pulses: Normal pulses.     Heart sounds: Normal heart sounds.  Pulmonary:     Effort: Pulmonary effort is normal.     Breath sounds: Normal breath sounds. No wheezing, rhonchi or rales.  Lymphadenopathy:     Head:     Right side of head: No submental, submandibular, tonsillar, preauricular, posterior auricular or occipital adenopathy.     Left side of head: No submental, submandibular, tonsillar, preauricular, posterior auricular  or occipital adenopathy.     Cervical: No cervical adenopathy.  Skin:    General: Skin is warm and dry.  Neurological:     Mental Status: She is alert.  Psychiatric:        Speech: Speech normal.        Behavior: Behavior normal.        Thought Content: Thought content normal.

## 2023-11-30 ENCOUNTER — Telehealth: Payer: Self-pay

## 2023-11-30 ENCOUNTER — Ambulatory Visit: Payer: 59 | Admitting: Student

## 2023-11-30 DIAGNOSIS — J309 Allergic rhinitis, unspecified: Secondary | ICD-10-CM

## 2023-11-30 DIAGNOSIS — R110 Nausea: Secondary | ICD-10-CM

## 2023-11-30 MED ORDER — METOCLOPRAMIDE HCL 10 MG PO TABS: 10.0000 mg | ORAL_TABLET | Freq: Three times a day (TID) | ORAL | 2 refills | Status: DC | PRN

## 2023-11-30 MED ORDER — FEXOFENADINE HCL 180 MG PO TABS: 180.0000 mg | ORAL_TABLET | Freq: Every day | ORAL | 11 refills | Status: DC

## 2023-11-30 NOTE — Telephone Encounter (Signed)
Called and spoke with pot smother and she informed me that [facility] are [facility] in [address] and needed those scripts sent in. Scripts have been sent in

## 2023-11-30 NOTE — Addendum Note (Signed)
Addended by: Donavan Foil on: 11/30/2023 01:00 PM   Modules accepted: Orders

## 2023-11-30 NOTE — Telephone Encounter (Signed)
Copied from CRM [id number]. Topic: Clinical - Medication Refill >> Nov 30, 2023 11:53 AM Joanette Gula wrote: Most Recent Primary Care Visit:  Provider: Allegra Grana  Department: [facility]  Visit Type: OFFICE VISIT  Date: 11/17/2023   Medication: fexofenadine (ALLEGRA ALLERGY) 180 MG   & MetoCLOPramide (REGLAN) 10 MG tablet  Has the patient contacted their pharmacy? Yes (Agent: If no, request that the patient contact the pharmacy for the refill. If patient does not wish to contact the pharmacy document the reason why and proceed with request.) (Agent: If yes, when and what did the pharmacy advise?)  Is this the correct pharmacy for this prescription? Yes If no, delete pharmacy and type the correct one.  This is the patient's preferred pharmacy:   [facility]  [address] Phone [phone]   Has the prescription been filled recently? Yes  Is the patient out of the medication? Yes  Has the patient been seen for an appointment in the last year OR does the patient have an upcoming appointment? Yes  Can we respond through MyChart? No  Agent: Please be advised that Rx refills may take up to 3 business days. We ask that you follow-up with your pharmacy.

## 2023-12-03 ENCOUNTER — Telehealth: Payer: Self-pay

## 2023-12-03 MED ORDER — OMEPRAZOLE 40 MG PO CPDR: 40.0000 mg | DELAYED_RELEASE_CAPSULE | Freq: Every morning | ORAL | 3 refills | Status: DC

## 2023-12-03 NOTE — Addendum Note (Signed)
Addended by: Donavan Foil on: 12/03/2023 03:16 PM   Modules accepted: Orders

## 2023-12-03 NOTE — Telephone Encounter (Signed)
Pts mother has been informed and stated they will get it OTC

## 2023-12-03 NOTE — Telephone Encounter (Signed)
Copied from CRM [id number]. Topic: Clinical - Medication Refill >> Dec 03, 2023  2:59 PM Tiffany H wrote: Most Recent Primary Care Visit:  Provider: Allegra Grana  Department: [facility]  Visit Type: OFFICE VISIT  Date: 11/17/2023  Medication: omeprazole (PRILOSEC) 40 MG capsule  fexofenadine (ALLEGRA ALLERGY) 180 MG tablet - insurance will not cover this, is there a similar medication that [facility] will cover?  Has the patient contacted their pharmacy? Yes (Agent: If no, request that the patient contact the pharmacy for the refill. If patient does not wish to contact the pharmacy document the reason why and proceed with request.) (Agent: If yes, when and what did the pharmacy advise?)  Is this the correct pharmacy for this prescription? Yes If no, delete pharmacy and type the correct one.  This is the patient's preferred pharmacy:  [facility] [address] [place] - [address] [place]) [place] [address] Phone: [phone] Fax: [phone]   Has the prescription been filled recently? No  Is the patient out of the medication? Yes  Has the patient been seen for an appointment in the last year OR does the patient have an upcoming appointment? Yes  Can we respond through MyChart? Yes  Agent: Please be advised that Rx refills may take up to 3 business days. We ask that you follow-up with your pharmacy.

## 2023-12-07 ENCOUNTER — Ambulatory Visit (INDEPENDENT_AMBULATORY_CARE_PROVIDER_SITE_OTHER): Payer: 59 | Admitting: Family Medicine

## 2023-12-07 ENCOUNTER — Other Ambulatory Visit (HOSPITAL_COMMUNITY)
Admission: RE | Admit: 2023-12-07 | Discharge: 2023-12-07 | Disposition: A | Discharge status: Home / Self Care | Payer: 59 | Source: Ambulatory Visit | Attending: Family Medicine | Admitting: Family Medicine

## 2023-12-07 ENCOUNTER — Encounter: Payer: Self-pay | Admitting: Family Medicine

## 2023-12-07 VITALS — BP 110/72 | HR 72 | Temp 98.2°F | Resp 18 | Ht 64.0 in | Wt 207.5 lb

## 2023-12-07 DIAGNOSIS — L081 Erythrasma: Secondary | ICD-10-CM | POA: Diagnosis not present

## 2023-12-07 DIAGNOSIS — B379 Candidiasis, unspecified: Secondary | ICD-10-CM

## 2023-12-07 DIAGNOSIS — N760 Acute vaginitis: Secondary | ICD-10-CM | POA: Diagnosis present

## 2023-12-07 DIAGNOSIS — R399 Unspecified symptoms and signs involving the genitourinary system: Secondary | ICD-10-CM

## 2023-12-07 LAB — POC URINALSYSI DIPSTICK (AUTOMATED)
Bilirubin, UA: NEGATIVE
Blood, UA: NEGATIVE
Glucose, UA: NEGATIVE
Ketones, UA: NEGATIVE
Leukocytes, UA: NEGATIVE
Nitrite, UA: NEGATIVE
Protein, UA: NEGATIVE
Spec Grav, UA: <=1.005 — AB (ref 1.010–1.025)
Urobilinogen, UA: 0.2 U/dL
pH, UA: 5.5 (ref 5.0–8.0)

## 2023-12-07 MED ORDER — CLINDAMYCIN PHOSPHATE 2 % VA CREA: 1.0000 | TOPICAL_CREAM | Freq: Two times a day (BID) | VAGINAL | 0 refills | Status: AC

## 2023-12-07 MED ORDER — FLUCONAZOLE 150 MG PO TABS: 150.0000 mg | ORAL_TABLET | Freq: Every day | ORAL | 0 refills | Status: DC
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
## 2023-12-07 NOTE — Progress Notes (Unsigned)
SUBJECTIVE:   Chief Complaint  Patient presents with   Vaginal Itching   Vaginal Pain    Burning X 3 day   HPI Presents for acute visit  Discussed the use of AI scribe software for clinical note transcription with the patient, who gave verbal consent to proceed.  History of Present Illness   The patient presents with a chief complaint of a suspected yeast infection, which she has been experiencing for several days. She reports associated symptoms of back pain, hot and cold flashes, and vaginal discharge. The patient denies any fever, but has not checked her temperature. She also reports dysuria and the presence of blood in her urine, although she is not currently menstruating.  The patient has a history of a lesion, for which she underwent a procedure due to severe abdominal pain and bleeding. She continues to experience abdominal pain. The patient denies any history of kidney stones.  The patient has been sexually inactive and has not been on any antibiotics recently, except for a yeast infection. She reports recurrent issues similar to her current symptoms. The patient also reports constipation and is on medication for bowel issues.  The patient has had a Pap smear in the past, but the exact timeline is unclear. She reports discomfort both inside and outside the vagina, with more pronounced symptoms on the outside. The patient describes the discomfort as a burning sensation rather than itching. She has not noticed any spreading of the discomfort. This is the first time she has experienced such symptoms in this area.     PERTINENT PMH / PSH: ***  OBJECTIVE:  BP 110/72   Pulse 72   Temp 98.2 F (36.8 C)   Resp 18   Ht 5\' 4"  (1.626 m)   Wt 207 lb 8 oz (94.1 kg)   SpO2 95%   BMI 35.62 kg/m    Physical Exam Vitals reviewed. Exam conducted with a chaperone present.  Constitutional:      General: She is not in acute distress.    Appearance: She is obese. She is not  ill-appearing or toxic-appearing.  Genitourinary:    Exam position: Lithotomy position.     Pubic Area: Rash present. No pubic lice.      Labia:        Right: Rash present. No tenderness or lesion.        Left: Rash present. No tenderness or lesion.      Vagina: Normal. No vaginal discharge, erythema or bleeding.     Cervix: Normal. No cervical bleeding.  Skin:    Findings: Erythema and rash present.  Neurological:     Mental Status: She is alert.        10/14/2023   12:25 PM 09/15/2023   10:33 AM 08/28/2023    8:31 AM 08/27/2023   10:54 AM 08/04/2023   11:28 AM  Depression screen PHQ 2/9  Decreased Interest 3 0 0 0 0  Down, Depressed, Hopeless 0 0 0 0 0  PHQ - 2 Score 3 0 0 0 0  Altered sleeping 3  3    Tired, decreased energy 3  0    Change in appetite 0  0    Feeling bad or failure about yourself  0  0    Trouble concentrating 3  0    Moving slowly or fidgety/restless 0  0    Suicidal thoughts 0  0    PHQ-9 Score 12  3    Difficult doing  work/chores Somewhat difficult  Not difficult at all        10/14/2023   12:25 PM 08/28/2023    8:31 AM 04/15/2023   10:32 AM  GAD 7 : Generalized Anxiety Score  Nervous, Anxious, on Edge 0 0 0  Control/stop worrying 0 0 0  Worry too much - different things 0 0 0  Trouble relaxing 3 0 0  Restless 3 0 0  Easily annoyed or irritable 3 0 0  Afraid - awful might happen 0 0 0  Total GAD 7 Score 9 0 0  Anxiety Difficulty Somewhat difficult Not difficult at all Not difficult at all    ASSESSMENT/PLAN:  UTI symptoms -     POCT Urinalysis Dipstick (Automated)  Assessment and Plan    Vaginal Yeast Infection Burning and itching for several days. Not sexually active. No urinary symptoms. Urine analysis normal. External exam shows yeast infection. -Prescribe Mycostatin powder for external use. -If no improvement in a few days, consider starting oral antibiotics.  Abdominal Pain Reports abdominal pain. History of hysterectomy due to  heavy menstrual bleeding. No further details provided. -Plan not discussed in the conversation.  Constipation Reports taking medication for constipation. -Plan not discussed in the conversation.      PDMP reviewed***  No follow-ups on file.  Dana Allan, MD

## 2023-12-07 NOTE — Patient Instructions (Signed)
It was a pleasure meeting you today. Thank you for allowing me to take part in your health care.  Our goals for today as we discussed include:  Use Clindamycin cream two times a day  for 14 days in the groin area Take Diflucan 150 mg once  Keep area dry  Will notify you of results of swabs today.    This is a list of the screening recommended for you and due dates:  Health Maintenance  Topic Date Due   Medicare Annual Wellness Visit  Never done   COVID-19 Vaccine (1) Never done   HIV Screening  Never done   Hepatitis C Screening  Never done   DTaP/Tdap/Td vaccine (2 - Td or Tdap) 06/07/2022   Flu Shot  03/07/2024*   Pap with HPV screening  06/05/2026   HPV Vaccine  Aged Out  *Topic was postponed. The date shown is not the original due date.     Follow up as needed or no improvement   If you have any questions or concerns, please do not hesitate to call the office at [phone].  I look forward to our next visit and until then take care and stay safe.  Regards,   Dana Allan, MD   [facility]

## 2023-12-10 ENCOUNTER — Other Ambulatory Visit: Payer: Self-pay | Admitting: Family

## 2023-12-10 DIAGNOSIS — H6993 Unspecified Eustachian tube disorder, bilateral: Secondary | ICD-10-CM

## 2023-12-10 LAB — CERVICOVAGINAL ANCILLARY ONLY
Bacterial Vaginitis (gardnerella): NEGATIVE
Candida Glabrata: NEGATIVE
Candida Vaginitis: NEGATIVE
Chlamydia: NEGATIVE
Comment: NEGATIVE
Comment: NEGATIVE
Comment: NEGATIVE
Comment: NEGATIVE
Comment: NEGATIVE
Comment: NORMAL
Neisseria Gonorrhea: NEGATIVE
Trichomonas: NEGATIVE

## 2023-12-11 ENCOUNTER — Encounter: Payer: Self-pay | Admitting: Family Medicine

## 2023-12-11 DIAGNOSIS — L081 Erythrasma: Secondary | ICD-10-CM | POA: Insufficient documentation

## 2023-12-11 DIAGNOSIS — B379 Candidiasis, unspecified: Secondary | ICD-10-CM | POA: Insufficient documentation

## 2023-12-11 NOTE — Assessment & Plan Note (Signed)
 Burning and itching for several days. Not sexually active. No urinary symptoms. Urine analysis normal. External exam shows intertrigo in bilateral groin extending to labia.  No satellite lesions noted.  Speculum exam notable for mild vaginal erythema, no significant vaginal discharge. -Prescribe Mycostatin powder for external use for possible candida -Diflucan  150 mg po x 1 dose -Start Clindamycin  cream BID x 14 days to groin

## 2023-12-11 NOTE — Assessment & Plan Note (Signed)
 Speculum exam reveals vaginal erythema  Start Diflucan Start Nystatin powder Swabs sent for BV, GC&C

## 2023-12-15 ENCOUNTER — Telehealth: Payer: Self-pay | Admitting: Nurse Practitioner

## 2023-12-15 ENCOUNTER — Encounter (INDEPENDENT_AMBULATORY_CARE_PROVIDER_SITE_OTHER): Payer: 59 | Admitting: Ophthalmology

## 2023-12-15 DIAGNOSIS — H3581 Retinal edema: Secondary | ICD-10-CM

## 2023-12-17 ENCOUNTER — Encounter (INDEPENDENT_AMBULATORY_CARE_PROVIDER_SITE_OTHER): Payer: 59 | Admitting: Ophthalmology

## 2023-12-18 ENCOUNTER — Encounter (INDEPENDENT_AMBULATORY_CARE_PROVIDER_SITE_OTHER): Payer: 59 | Admitting: Ophthalmology

## 2023-12-22 ENCOUNTER — Other Ambulatory Visit: Payer: Self-pay | Admitting: Nurse Practitioner

## 2023-12-22 DIAGNOSIS — K5909 Other constipation: Secondary | ICD-10-CM

## 2023-12-22 DIAGNOSIS — F39 Unspecified mood [affective] disorder: Secondary | ICD-10-CM

## 2023-12-22 DIAGNOSIS — R110 Nausea: Secondary | ICD-10-CM

## 2023-12-22 DIAGNOSIS — B379 Candidiasis, unspecified: Secondary | ICD-10-CM

## 2023-12-22 DIAGNOSIS — J309 Allergic rhinitis, unspecified: Secondary | ICD-10-CM

## 2023-12-22 DIAGNOSIS — G894 Chronic pain syndrome: Secondary | ICD-10-CM

## 2023-12-22 DIAGNOSIS — M797 Fibromyalgia: Secondary | ICD-10-CM

## 2023-12-22 NOTE — Telephone Encounter (Signed)
 Copied from CRM [id number]. Topic: Clinical - Medication Refill >> Dec 22, 2023  9:54 AM Jamie Ross wrote: Most Recent Primary Care Visit:  Provider: HOPE MERLE  Department: [facility]  Visit Type: ACUTE  Date: 12/07/2023  Medication: ***  Has the patient contacted their pharmacy?  (Agent: If no, request that the patient contact the pharmacy for the refill. If patient does not wish to contact the pharmacy document the reason why and proceed with request.) (Agent: If yes, when and what did the pharmacy advise?)  Is this the correct pharmacy for this prescription?  If no, delete pharmacy and type the correct one.  This is the patient's preferred pharmacy:  [facility] [address] ([address]), [place] - [address] ([address]) [place] [address] Phone: [phone] Fax: [phone]  [id number] - [place], [place] - [address] [place] [address] Phone: [phone] Fax: [phone]   Has the prescription been filled recently?   Is the patient out of the medication?   Has the patient been seen for an appointment in the last year OR does the patient have an upcoming appointment?   Can we respond through MyChart?   Agent: Please be advised that Rx refills may take up to 3 business days. We ask that you follow-up with your pharmacy.
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
## 2023-12-24 MED ORDER — MELOXICAM 15 MG PO TABS: 15.0000 mg | ORAL_TABLET | Freq: Every day | ORAL | 3 refills | Status: DC

## 2023-12-24 MED ORDER — METOCLOPRAMIDE HCL 10 MG PO TABS: 10.0000 mg | ORAL_TABLET | Freq: Three times a day (TID) | ORAL | 2 refills | Status: DC | PRN

## 2023-12-24 MED ORDER — OMEPRAZOLE 40 MG PO CPDR: 40.0000 mg | DELAYED_RELEASE_CAPSULE | Freq: Every morning | ORAL | 3 refills | Status: DC

## 2023-12-24 MED ORDER — FLUTICASONE PROPIONATE 50 MCG/ACT NA SUSP
NASAL | 3 refills | Status: AC
Start: 2023-12-24 — End: ?

## 2023-12-24 MED ORDER — LINZESS 72 MCG PO CAPS: 72.0000 ug | ORAL_CAPSULE | Freq: Every day | ORAL | 11 refills | Status: DC

## 2023-12-29 ENCOUNTER — Ambulatory Visit (INDEPENDENT_AMBULATORY_CARE_PROVIDER_SITE_OTHER): Payer: 59 | Admitting: Ophthalmology

## 2023-12-29 ENCOUNTER — Encounter (INDEPENDENT_AMBULATORY_CARE_PROVIDER_SITE_OTHER): Payer: Self-pay | Admitting: Ophthalmology

## 2023-12-29 DIAGNOSIS — H3581 Retinal edema: Secondary | ICD-10-CM

## 2023-12-29 DIAGNOSIS — I10 Essential (primary) hypertension: Secondary | ICD-10-CM | POA: Diagnosis not present

## 2023-12-29 DIAGNOSIS — H50112 Monocular exotropia, left eye: Secondary | ICD-10-CM

## 2023-12-29 DIAGNOSIS — H47323 Drusen of optic disc, bilateral: Secondary | ICD-10-CM | POA: Diagnosis not present

## 2023-12-29 DIAGNOSIS — H35033 Hypertensive retinopathy, bilateral: Secondary | ICD-10-CM | POA: Diagnosis not present

## 2023-12-29 DIAGNOSIS — H53002 Unspecified amblyopia, left eye: Secondary | ICD-10-CM

## 2023-12-29 DIAGNOSIS — H3589 Other specified retinal disorders: Secondary | ICD-10-CM

## 2023-12-31 ENCOUNTER — Inpatient Hospital Stay: Payer: 59

## 2024-01-07 ENCOUNTER — Inpatient Hospital Stay: Payer: 59 | Attending: Oncology

## 2024-01-07 DIAGNOSIS — D72829 Elevated white blood cell count, unspecified: Secondary | ICD-10-CM | POA: Diagnosis present

## 2024-01-07 DIAGNOSIS — D72828 Other elevated white blood cell count: Secondary | ICD-10-CM

## 2024-01-07 LAB — CBC WITH DIFFERENTIAL/PLATELET
Abs Immature Granulocytes: 0.07 10*3/uL (ref 0.00–0.07)
Basophils Absolute: 0.1 10*3/uL (ref 0.0–0.1)
Basophils Relative: 1 %
Eosinophils Absolute: 0.1 10*3/uL (ref 0.0–0.5)
Eosinophils Relative: 1 %
HCT: 37.1 % (ref 36.0–46.0)
Hemoglobin: 12.3 g/dL (ref 12.0–15.0)
Immature Granulocytes: 1 %
Lymphocytes Relative: 27 %
Lymphs Abs: 2.7 10*3/uL (ref 0.7–4.0)
MCH: 30.8 pg (ref 26.0–34.0)
MCHC: 33.2 g/dL (ref 30.0–36.0)
MCV: 92.8 fL (ref 80.0–100.0)
Monocytes Absolute: 0.7 10*3/uL (ref 0.1–1.0)
Monocytes Relative: 7 %
Neutro Abs: 6.2 10*3/uL (ref 1.7–7.7)
Neutrophils Relative %: 63 %
Platelets: 275 10*3/uL (ref 150–400)
RBC: 4 MIL/uL (ref 3.87–5.11)
RDW: 12.1 % (ref 11.5–15.5)
WBC: 9.8 10*3/uL (ref 4.0–10.5)
nRBC: 0 % (ref 0.0–0.2)

## 2024-01-07 LAB — VITAMIN B12: Vitamin B-12: 2622 pg/mL — ABNORMAL HIGH (ref 180–914)

## 2024-01-07 LAB — FOLATE: Folate: 7.1 ng/mL (ref >5.9)

## 2024-01-20 ENCOUNTER — Other Ambulatory Visit: Payer: Self-pay

## 2024-01-20 ENCOUNTER — Ambulatory Visit (INDEPENDENT_AMBULATORY_CARE_PROVIDER_SITE_OTHER): Payer: 59 | Admitting: Gastroenterology

## 2024-01-20 ENCOUNTER — Encounter: Payer: Self-pay | Admitting: Gastroenterology

## 2024-01-20 VITALS — BP 124/82 | HR 89 | Temp 97.6°F | Ht 64.0 in | Wt 211.0 lb

## 2024-01-20 DIAGNOSIS — K5904 Chronic idiopathic constipation: Secondary | ICD-10-CM
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
## 2024-01-20 NOTE — Patient Instructions (Addendum)
Try Fiber Supplements as Fibercon, Metamucil, Benafiber  Increase Linzess 30 minutes before breakfast  and let us know if it is working for [place].  Okay to Try Miralax 1 cupful with a large glass of water if needed

## 2024-01-26 ENCOUNTER — Ambulatory Visit: Payer: 59 | Admitting: Nurse Practitioner

## 2024-01-27 ENCOUNTER — Ambulatory Visit: Payer: 59 | Admitting: Nurse Practitioner

## 2024-02-04 ENCOUNTER — Encounter: Payer: Self-pay | Admitting: Nurse Practitioner

## 2024-02-04 ENCOUNTER — Ambulatory Visit (INDEPENDENT_AMBULATORY_CARE_PROVIDER_SITE_OTHER): Payer: 59 | Admitting: Nurse Practitioner

## 2024-02-04 VITALS — BP 120/82 | HR 96 | Temp 98.2°F | Resp 18 | Ht 64.0 in | Wt 207.5 lb

## 2024-02-04 DIAGNOSIS — R7989 Other specified abnormal findings of blood chemistry: Secondary | ICD-10-CM

## 2024-02-04 DIAGNOSIS — E66812 Obesity, class 2: Secondary | ICD-10-CM | POA: Diagnosis not present

## 2024-02-04 DIAGNOSIS — J309 Allergic rhinitis, unspecified: Secondary | ICD-10-CM

## 2024-02-04 DIAGNOSIS — R110 Nausea: Secondary | ICD-10-CM

## 2024-02-04 DIAGNOSIS — K219 Gastro-esophageal reflux disease without esophagitis: Secondary | ICD-10-CM

## 2024-02-04 DIAGNOSIS — F39 Unspecified mood [affective] disorder: Secondary | ICD-10-CM

## 2024-02-04 DIAGNOSIS — G4700 Insomnia, unspecified: Secondary | ICD-10-CM

## 2024-02-04 DIAGNOSIS — R519 Headache, unspecified: Secondary | ICD-10-CM

## 2024-02-04 DIAGNOSIS — Z6835 Body mass index (BMI) 35.0-35.9, adult: Secondary | ICD-10-CM | POA: Diagnosis not present

## 2024-02-04 DIAGNOSIS — I10 Essential (primary) hypertension: Secondary | ICD-10-CM | POA: Diagnosis not present

## 2024-02-04 DIAGNOSIS — G8929 Other chronic pain: Secondary | ICD-10-CM

## 2024-02-04 MED ORDER — PROPRANOLOL HCL ER 60 MG PO CP24: 60.0000 mg | ORAL_CAPSULE | Freq: Every day | ORAL | 0 refills | Status: DC

## 2024-02-04 MED ORDER — TRAZODONE HCL 150 MG PO TABS
150.0000 mg | ORAL_TABLET | Freq: Every day | ORAL | 1 refills | Status: AC
Start: 2024-02-04 — End: ?

## 2024-02-04 MED ORDER — ZEPBOUND 5 MG/0.5ML ~~LOC~~ SOAJ: 5.0000 mg | SUBCUTANEOUS | 0 refills | Status: DC

## 2024-02-04 MED ORDER — ZEPBOUND 2.5 MG/0.5ML ~~LOC~~ SOAJ: 2.5000 mg | SUBCUTANEOUS | 0 refills | Status: DC

## 2024-02-04 MED ORDER — PANTOPRAZOLE SODIUM 40 MG PO TBEC
40.0000 mg | DELAYED_RELEASE_TABLET | Freq: Every day | ORAL | 3 refills | Status: DC
Start: 2024-02-04 — End: 2024-04-07

## 2024-02-04 NOTE — Progress Notes (Signed)
 Jamie Dicker, NP-C Phone: [phone]  NAKEETA Jamie Ross is a 38 y.o. female who presents today for follow up.   Discussed the use of AI scribe software for clinical note transcription with the patient, who gave verbal consent to proceed.  History of Present Illness   Jamie Ross is a 38 year old female who presents with headaches and body aches.  She experiences persistent headaches and body aches, which have been particularly severe since Saturday night. The pain is described as 'aching all over' and disrupts her sleep, causing her to wake up multiple times during the night. Various over-the-counter medications such as Tylenol, ibuprofen, and BC powder have been tried without relief. She also mentions that her medications have caused 'bad headaches and body aches.'  She has a significant history of anxiety, which she describes as 'high.' She is currently seeing a psychiatrist once a month, who has adjusted her medications. She is on multiple psychiatric medications, including Haldol, prazosin, Zoloft, and hydroxyzine, and reports that these medications make her tired but do not help her sleep. She takes trazodone at bedtime, sometimes two tablets, but still struggles with sleep.  She has a history of high B12 levels and has stopped taking her gummies as a result. She is concerned about her B12 levels and requests a recheck. She also reports experiencing bad indigestion and frequent nausea, which she attributes to her medications. She takes a 'sickness pill' once a day, but it does not seem to alleviate her symptoms. She has a history of acid reflux and is currently on medication for it, but it is not providing relief. She finds that drinking more water makes her feel sick in the morning.  She is trying to lose weight and has been making efforts to do so. Her back pain is a significant issue, and she believes that weight loss might help alleviate some of her pain. She drinks coffee and  soda regularly, consuming about two drinks in the morning to help with stomach sickness, but she is aware that her caffeine intake might be contributing to her symptoms. She also experiences dry mouth and increased thirst, which she attributes to her medications.  She takes propranolol, 10 mg twice a day, for blood pressure and reports that it helps with her tremors. She also takes meloxicam daily for pain, but it does not seem to be effective.      Social History   Tobacco Use  Smoking Status Never  Smokeless Tobacco Never    Current Outpatient Medications on File Prior to Visit  Medication Sig Dispense Refill   acetaminophen (TYLENOL) 650 MG CR tablet Take 1,300 mg by mouth every 8 (eight) hours as needed.     albuterol (VENTOLIN HFA) 108 (90 Base) MCG/ACT inhaler TAKE 2 PUFFS BY MOUTH EVERY 6 HOURS AS NEEDED FOR WHEEZE OR SHORTNESS OF BREATH 8.5 each 5   amantadine (SYMMETREL) 100 MG capsule Take 100 mg by mouth 2 (two) times daily.     Azelastine HCl 137 MCG/SPRAY SOLN PLACE 1 SPRAY INTO BOTH NOSTRILS 2 (TWO) TIMES DAILY. USE IN EACH NOSTRIL AS DIRECTED 90 mL 1   budesonide-formoterol (SYMBICORT) 160-4.5 MCG/ACT inhaler Inhale 2 puffs into the lungs 2 (two) times daily. 1 Inhaler 0   Cyanocobalamin ([employer] B12 GUMMIES PO) Take by mouth.     fluticasone (FLONASE) 50 MCG/ACT nasal spray 1 spray into each nostril daily. 15.8 mL 3   haloperidol (HALDOL) 5 MG tablet Take 0.5 tablets (2.5 mg total) by  mouth 2 (two) times daily. 30 tablet 0   hydrOXYzine (ATARAX) 25 MG tablet Take 12.5-25 mg by mouth 2 (two) times daily.     meloxicam (MOBIC) 15 MG tablet Take 1 tablet (15 mg total) by mouth daily. 90 tablet 3   metoCLOPramide (REGLAN) 10 MG tablet Take 1 tablet (10 mg total) by mouth every 8 (eight) hours as needed for nausea. 30 tablet 2   prazosin (MINIPRESS) 5 MG capsule Take 5 mg by mouth at bedtime.     Probiotic Product (ALIGN DUALBIOTIC) CHEW Chew 1 capsule by mouth as needed.      sertraline (ZOLOFT) 100 MG tablet Take 100 mg by mouth daily.     [DISCONTINUED] hydrochlorothiazide (MICROZIDE) 12.5 MG capsule Take 12.5 mg by mouth daily.     [DISCONTINUED] potassium chloride SA (K-DUR,KLOR-CON) 20 MEQ tablet Take 20 mEq by mouth daily.     No current facility-administered medications on file prior to visit.    ROS see history of present illness  Objective  Physical Exam Vitals:   02/04/24 1307  BP: 120/82  Pulse: 96  Resp: 18  Temp: 98.2 F (36.8 C)  SpO2: 94%    BP Readings from Last 3 Encounters:  02/04/24 120/82  01/20/24 124/82  12/07/23 110/72   Wt Readings from Last 3 Encounters:  02/04/24 207 lb 8 oz (94.1 kg)  01/20/24 211 lb (95.7 kg)  12/07/23 207 lb 8 oz (94.1 kg)    Physical Exam Constitutional:      General: She is not in acute distress.    Appearance: Normal appearance.  HENT:     Head: Normocephalic.  Cardiovascular:     Rate and Rhythm: Normal rate and regular rhythm.     Heart sounds: Normal heart sounds.  Pulmonary:     Effort: Pulmonary effort is normal.     Breath sounds: Normal breath sounds.  Skin:    General: Skin is warm and dry.  Neurological:     General: No focal deficit present.     Mental Status: She is alert.  Psychiatric:        Mood and Affect: Mood normal.        Behavior: Behavior normal.     Assessment/Plan: Please see individual problem list.  Chronic intractable headache, unspecified headache type Assessment & Plan: Chronic issue. She used to take Topamax daily with relief, however medication was stopped due to chest pains. Frequent headaches and body aches may be linked to medication side effects or caffeine withdrawal. Increase Propranolol to an extended-release formulation for headache prevention and tremor reduction. Increase Trazodone to 150mg  to improve sleep. Add a Magnesium supplement to relieve anxiety and cramping. Consider reducing caffeine intake and increasing water intake. Follow up  with Psychiatry to review psych medications and potential side effects.   Orders: -     Propranolol HCl ER; Take 1 capsule (60 mg total) by mouth daily.  Dispense: 90 capsule; Refill: 0  Class 2 obesity without serious comorbidity with body mass index (BMI) of 35.0 to 35.9 in adult, unspecified obesity type Assessment & Plan: There is a desire to lose weight to help with chronic pain. Start Zepbound injection for weight loss. Counseled on the risk of pancreatitis and gallbladder disease. Discussed the risk of nausea. Advised to discontinue the Zepbound and contact us immediately if they develop abdominal pain. If they develop excessive nausea they will contact us right away. I discussed that medullary thyroid cancer has been seen in rats studies.  The patient confirmed no personal or family history of thyroid cancer, parathyroid cancer, or adrenal gland cancer. Discussed that we thus far have not seen medullary thyroid cancer result from use of this type of medication in humans. Advised to monitor the thyroid area and contact us for any lumps, swelling, trouble swallowing, or any other changes in this area.  Discussed goal weight loss of 1 to 2 pounds a week while on this medication. Discussed the importance of healthy diet, exercise and lifestyle modifications even with this medication. Follow up in 6 weeks to assess tolerance and effectiveness.   Orders: -     Zepbound; Inject 2.5 mg into the skin once a week. X 4 weeks then increase to 5 mg  Dispense: 2 mL; Refill: 0 -     Zepbound; Inject 5 mg into the skin once a week.  Dispense: 2 mL; Refill: 0 -     Hemoglobin A1c  Insomnia, unspecified type Assessment & Plan: Chronic issue. Increase Trazodone to 150mg  to improve sleep. Add a Magnesium supplement to relieve anxiety and cramping. Consider reducing caffeine intake and increasing water intake.   Orders: -     traZODone HCl; Take 1 tablet (150 mg total) by mouth at bedtime.  Dispense: 90 tablet;  Refill: 1  Mood disorder [facility]) Assessment & Plan: On multiple medications, concern that side effects are contributing to other problems. Follow up with Psychiatry for medication review and to discuss possible side effects.    Elevated vitamin B12 level Assessment & Plan: Previously high Vitamin B12 levels led to stopping supplements. Check Vitamin B12 levels.  Orders: -     Vitamin B12  Primary hypertension Assessment & Plan: BP stable. Currently on Propranolol 10 mg twice daily, changing to 60 mg ER for better headache prevention and tremor control. Counseled on symptoms of hypotension. Encouraged adequate hydration. Will continue to monitor.   Orders: -     Propranolol HCl ER; Take 1 capsule (60 mg total) by mouth daily.  Dispense: 90 capsule; Refill: 0 -     Comprehensive metabolic panel  Gastroesophageal reflux disease, unspecified whether esophagitis present Assessment & Plan: Complaints of bad indigestion warrant changing Omeprazole to Protonix for better acid reflux management. Encouraged to monitor diet for triggers. Suspect improvement with weight loss.   Orders: -     Pantoprazole Sodium; Take 1 tablet (40 mg total) by mouth daily.  Dispense: 90 tablet; Refill: 3    Return in about 6 weeks (around 03/17/2024) for Follow up.   Jamie Dicker, NP-C [facility]

## 2024-02-05 ENCOUNTER — Telehealth: Payer: Self-pay

## 2024-02-05 LAB — COMPREHENSIVE METABOLIC PANEL
ALT: 11 U/L (ref 0–35)
AST: 11 U/L (ref 0–37)
Albumin: 4.2 g/dL (ref 3.5–5.2)
Alkaline Phosphatase: 91 U/L (ref 39–117)
BUN: 13 mg/dL (ref 6–23)
CO2: 26 meq/L (ref 19–32)
Calcium: 9.3 mg/dL (ref 8.4–10.5)
Chloride: 100 meq/L (ref 96–112)
Creatinine, Ser: 0.74 mg/dL (ref 0.40–1.20)
GFR: 103.16 mL/min (ref >60.00)
Glucose, Bld: 87 mg/dL (ref 70–99)
Potassium: 4.1 meq/L (ref 3.5–5.1)
Sodium: 136 meq/L (ref 135–145)
Total Bilirubin: 0.3 mg/dL (ref 0.2–1.2)
Total Protein: 6.9 g/dL (ref 6.0–8.3)

## 2024-02-05 LAB — VITAMIN B12: Vitamin B-12: >1537 pg/mL — ABNORMAL HIGH (ref 211–911)

## 2024-02-05 LAB — HEMOGLOBIN A1C: Hgb A1c MFr Bld: 5.7 % (ref 4.6–6.5)

## 2024-02-05 NOTE — Telephone Encounter (Signed)
 Copied from CRM [id number]. Topic: Clinical - Prescription Issue >> Feb 05, 2024  9:49 AM Fredrich Romans wrote: Reason for CRM: Patient needs PA for medication tirzepatide (ZEPBOUND) 2.5 MG/0.5ML Pen

## 2024-02-08 ENCOUNTER — Other Ambulatory Visit (HOSPITAL_COMMUNITY): Payer: Self-pay

## 2024-02-08 ENCOUNTER — Telehealth: Payer: Self-pay

## 2024-02-08 DIAGNOSIS — E66812 Obesity, class 2: Secondary | ICD-10-CM

## 2024-02-08 NOTE — Telephone Encounter (Signed)
 Pharmacy Patient Advocate Encounter   Received notification from Pt Calls Messages that prior authorization for Zepbound 2.5MG /0.5ML pen-injectors  is required/requested.   Insurance verification completed.   The patient is insured through [facility] .   Per test claim: PA required; PA submitted to above mentioned insurance via CoverMyMeds Key/confirmation #/EOC [id number] Status is pending

## 2024-02-08 NOTE — Telephone Encounter (Signed)
 Copied from CRM [id number]. Topic: Clinical - Prescription Issue >> Feb 08, 2024 12:00 PM Jamie Ross wrote: Reason for CRM: Patient mom stated insurance will not cover zepbound and would like to know if Dr. Konrad Dolores could prescribed something that insurance will cover. If needed, please call [phone]

## 2024-02-08 NOTE — Telephone Encounter (Signed)
 PA request has been Submitted. New Encounter created for follow up. For additional info see Pharmacy Prior Auth telephone encounter from 02/08/2024.

## 2024-02-11 ENCOUNTER — Telehealth: Payer: Self-pay

## 2024-02-11 NOTE — Telephone Encounter (Signed)
 Detailed VM left informing them of what provider stated:     Bethanie Dicker, NP  Donavan Foil, CMA I do not suspect the elevated B12 was causing her headaches as the headaches have been a chronic issue not new recently.      (E2C2 it is okay to relay message above send a message back once relayed.)

## 2024-02-11 NOTE — Telephone Encounter (Signed)
 Called and spoke with Jamie Ross and informed her about the status of the PA on Zepbound and that if they inform us that it has been denied then we will switch to the [facility] and we will update them

## 2024-02-11 NOTE — Telephone Encounter (Signed)
 Copied from CRM [id number]. Topic: General - Call Back - No Documentation >> Feb 11, 2024  2:28 PM Pascal Lux wrote: Reason for CRM: Patient father called regarding call back. Relayed message regarding headaches. He asked for a call back regarding patient medication.

## 2024-02-15 ENCOUNTER — Encounter: Payer: Self-pay | Admitting: Nurse Practitioner

## 2024-02-15 DIAGNOSIS — R7989 Other specified abnormal findings of blood chemistry: Secondary | ICD-10-CM | POA: Insufficient documentation

## 2024-02-15 NOTE — Assessment & Plan Note (Signed)
 Chronic issue. Increase Trazodone to 150mg  to improve sleep. Add a Magnesium supplement to relieve anxiety and cramping. Consider reducing caffeine intake and increasing water intake.

## 2024-02-15 NOTE — Assessment & Plan Note (Signed)
 BP stable. Currently on Propranolol 10 mg twice daily, changing to 60 mg ER for better headache prevention and tremor control. Counseled on symptoms of hypotension. Encouraged adequate hydration. Will continue to monitor.

## 2024-02-15 NOTE — Assessment & Plan Note (Signed)
 On multiple medications, concern that side effects are contributing to other problems. Follow up with Psychiatry for medication review and to discuss possible side effects.

## 2024-02-15 NOTE — Assessment & Plan Note (Addendum)
 Chronic issue. She used to take Topamax daily with relief, however medication was stopped due to chest pains. Frequent headaches and body aches may be linked to medication side effects or caffeine withdrawal. Increase Propranolol to an extended-release formulation for headache prevention and tremor reduction. Increase Trazodone to 150mg  to improve sleep. Add a Magnesium supplement to relieve anxiety and cramping. Consider reducing caffeine intake and increasing water intake. Follow up with Psychiatry to review psych medications and potential side effects.

## 2024-02-15 NOTE — Assessment & Plan Note (Signed)
 Complaints of bad indigestion warrant changing Omeprazole to Protonix for better acid reflux management. Encouraged to monitor diet for triggers. Suspect improvement with weight loss.

## 2024-02-15 NOTE — Assessment & Plan Note (Signed)
 Previously high Vitamin B12 levels led to stopping supplements. Check Vitamin B12 levels.

## 2024-02-15 NOTE — Assessment & Plan Note (Signed)
 There is a desire to lose weight to help with chronic pain. Start Zepbound injection for weight loss. Counseled on the risk of pancreatitis and gallbladder disease. Discussed the risk of nausea. Advised to discontinue the Zepbound and contact us immediately if they develop abdominal pain. If they develop excessive nausea they will contact us right away. I discussed that medullary thyroid cancer has been seen in rats studies. The patient confirmed no personal or family history of thyroid cancer, parathyroid cancer, or adrenal gland cancer. Discussed that we thus far have not seen medullary thyroid cancer result from use of this type of medication in humans. Advised to monitor the thyroid area and contact us for any lumps, swelling, trouble swallowing, or any other changes in this area.  Discussed goal weight loss of 1 to 2 pounds a week while on this medication. Discussed the importance of healthy diet, exercise and lifestyle modifications even with this medication. Follow up in 6 weeks to assess tolerance and effectiveness.

## 2024-02-17 ENCOUNTER — Telehealth: Payer: Self-pay | Admitting: Nurse Practitioner

## 2024-02-17 NOTE — Telephone Encounter (Signed)
 Kacy disregard.

## 2024-02-17 NOTE — Telephone Encounter (Signed)
 Copied from CRM [id number]. Topic: Clinical - Medication Question >> Feb 17, 2024  1:53 PM Alcus Dad wrote: Reason for CRM: Mother of patient stated that patient is trying to get on Wegovy. Please let mother know so that she can call her insurance and get things situated

## 2024-02-18 ENCOUNTER — Telehealth: Payer: Self-pay

## 2024-02-18 MED ORDER — SEMAGLUTIDE-WEIGHT MANAGEMENT 0.5 MG/0.5ML ~~LOC~~ SOAJ: 0.5000 mg | SUBCUTANEOUS | 0 refills | Status: DC

## 2024-02-18 MED ORDER — SEMAGLUTIDE-WEIGHT MANAGEMENT 0.25 MG/0.5ML ~~LOC~~ SOAJ: 0.2500 mg | SUBCUTANEOUS | 0 refills | Status: DC

## 2024-02-18 NOTE — Telephone Encounter (Addendum)
 Pharmacy Patient Advocate Encounter  Received notification from [facility] that Prior Authorization for Zepbound 2.5 has been DENIED because it is not on your plan formulary.   PA #/Case ID/Reference #: [id number]

## 2024-02-18 NOTE — Addendum Note (Signed)
 Addended by: Donavan Foil on: 02/18/2024 02:29 PM   Modules accepted: Orders

## 2024-02-18 NOTE — Telephone Encounter (Signed)
PA needed for wegovy

## 2024-02-22 ENCOUNTER — Telehealth: Payer: Self-pay

## 2024-02-22 ENCOUNTER — Other Ambulatory Visit (HOSPITAL_COMMUNITY): Payer: Self-pay

## 2024-02-22 NOTE — Telephone Encounter (Signed)
 Pharmacy Patient Advocate Encounter   Received notification from Pt Calls Messages that prior authorization for Wegovy 0.25 is required/requested.   Insurance verification completed.   The patient is insured through [facility] .   Per test claim: PA required; PA submitted to above mentioned insurance via CoverMyMeds Key/confirmation #/EOC [facility] Status is pending

## 2024-02-22 NOTE — Telephone Encounter (Signed)
 Pharmacy Patient Advocate Encounter   Received notification from Pt Calls Messages that prior authorization for Wegovy 0.25 is required/requested.   Insurance verification completed.   The patient is insured through [facility] . Prior Auth sbmitted key # [id number]. Status pending

## 2024-02-23 ENCOUNTER — Other Ambulatory Visit (HOSPITAL_COMMUNITY): Payer: Self-pay

## 2024-02-23 ENCOUNTER — Telehealth: Payer: Self-pay | Admitting: Nurse Practitioner

## 2024-02-23 ENCOUNTER — Telehealth: Payer: Self-pay

## 2024-02-23 NOTE — Telephone Encounter (Signed)
 Copied from CRM [id number]. Topic: Clinical - Prescription Issue >> Feb 23, 2024  8:45 AM Theodis Sato wrote: Reason for CRM: Patients mother states that the patients insurance will not cover the [facility] prescribed by Bethanie Dicker. Patients mother is requesting the provider to prescribe an alternative to [facility] that the patients insurance will cover.

## 2024-02-23 NOTE — Telephone Encounter (Signed)
 Pharmacy Patient Advocate Encounter  Received notification from [facility] that Prior Authorization for Wegovy 0.25 has been DENIED.  Full denial letter will be uploaded to the media tab. See denial reason below.   PA #/Case ID/Reference #: [id number]

## 2024-02-24 NOTE — Telephone Encounter (Signed)
 Called and informed mother and she does not want a referral she wanted me to cancel the 6 week follow up appt because she stated it would have been a follow up on the weight loss medication

## 2024-03-03 NOTE — Progress Notes (Signed)
 Jamie Dicker, NP-C Phone: [phone]  Jamie Ross is a 38 y.o. female who presents today for sleeping difficulty.   Discussed the use of AI scribe software for clinical note transcription with the patient, who gave verbal consent to proceed.  History of Present Illness   Jamie Ross is a 38 year old female who presents with insomnia and stomach pain. She is accompanied by her parents who are her caregivers, who provides additional information about her condition.  She experiences significant sleep disturbances, characterized by difficulty falling asleep and only dozing off occasionally. Her body aches contribute to her inability to sleep, and she experiences chest pain when breathing deeply. She has a history of sleep apnea and previously used a CPAP machine, which was lost. She is interested in a sleep study to address her sleep issues. She is on multiple medications, including trazodone, Zoloft, prazosin, Haldol, and Inderal. She takes Tylenol for pain and has been prescribed meloxicam for body aches, which she takes once a day. She is concerned about potential side effects from her medications, including seeing things and feeling like she is dreaming. She reports worsening headaches, which she attributes to her lack of sleep. She has decreased her caffeine intake, which she believes may also be contributing to her headaches.  She describes severe stomach cramping and acid reflux, stating 'my stomach been hurting bad' and 'it's acid.' No vomiting, diarrhea, or constipation, but she takes Linzess for bowel regulation. She has a history of hernia repair with mesh placement, which she questions as a potential cause of her symptoms. She is currently taking Protonix for acid reflux.  She experiences breast pain and discharge, describing it as 'white and yellow mixed.' She has a history of breast pain and has undergone an ablation. No burning during urination but reports increased water  intake to alleviate previous burning sensations.      Social History   Tobacco Use  Smoking Status Never  Smokeless Tobacco Never    Current Outpatient Medications on File Prior to Visit  Medication Sig Dispense Refill   acetaminophen (TYLENOL) 650 MG CR tablet Take 1,300 mg by mouth every 8 (eight) hours as needed.     albuterol (VENTOLIN HFA) 108 (90 Base) MCG/ACT inhaler TAKE 2 PUFFS BY MOUTH EVERY 6 HOURS AS NEEDED FOR WHEEZE OR SHORTNESS OF BREATH 8.5 each 5   amantadine (SYMMETREL) 100 MG capsule Take 100 mg by mouth 2 (two) times daily.     Azelastine HCl 137 MCG/SPRAY SOLN PLACE 1 SPRAY INTO BOTH NOSTRILS 2 (TWO) TIMES DAILY. USE IN EACH NOSTRIL AS DIRECTED 90 mL 1   budesonide-formoterol (SYMBICORT) 160-4.5 MCG/ACT inhaler Inhale 2 puffs into the lungs 2 (two) times daily. 1 Inhaler 0   Cyanocobalamin ([employer]S B12 GUMMIES PO) Take by mouth.     fluticasone (FLONASE) 50 MCG/ACT nasal spray 1 spray into each nostril daily. 15.8 mL 3   hydrOXYzine (ATARAX) 25 MG tablet Take 12.5-25 mg by mouth 2 (two) times daily.     metoCLOPramide (REGLAN) 10 MG tablet Take 1 tablet (10 mg total) by mouth every 8 (eight) hours as needed for nausea. 30 tablet 2   pantoprazole (PROTONIX) 40 MG tablet Take 1 tablet (40 mg total) by mouth daily. 90 tablet 3   prazosin (MINIPRESS) 5 MG capsule Take 5 mg by mouth at bedtime.     Probiotic Product (ALIGN DUALBIOTIC) CHEW Chew 1 capsule by mouth as needed.     propranolol ER (INDERAL LA) 60  MG 24 hr capsule Take 1 capsule (60 mg total) by mouth daily. 90 capsule 0   Semaglutide-Weight Management 0.25 MG/0.5ML SOAJ Inject 0.25 mg into the skin once a week. 2 mL 0   [START ON 03/20/2024] Semaglutide-Weight Management 0.5 MG/0.5ML SOAJ Inject 0.5 mg into the skin once a week. 2 mL 0   sertraline (ZOLOFT) 100 MG tablet Take 100 mg by mouth daily.     traZODone (DESYREL) 150 MG tablet Take 1 tablet (150 mg total) by mouth at bedtime. 90 tablet 1   [DISCONTINUED]  hydrochlorothiazide (MICROZIDE) 12.5 MG capsule Take 12.5 mg by mouth daily.     [DISCONTINUED] potassium chloride SA (K-DUR,KLOR-CON) 20 MEQ tablet Take 20 mEq by mouth daily.     No current facility-administered medications on file prior to visit.     ROS see history of present illness  Objective  Physical Exam Vitals:   03/04/24 0813  BP: 122/76  Pulse: 61  Temp: (!) 97.5 F (36.4 C)  SpO2: 94%    BP Readings from Last 3 Encounters:  03/08/24 110/72  03/04/24 122/76  02/04/24 120/82   Wt Readings from Last 3 Encounters:  03/08/24 206 lb 9.6 oz (93.7 kg)  03/04/24 205 lb 6.4 oz (93.2 kg)  02/04/24 207 lb 8 oz (94.1 kg)    Physical Exam Constitutional:      General: She is not in acute distress.    Appearance: Normal appearance.  HENT:     Head: Normocephalic.  Cardiovascular:     Rate and Rhythm: Normal rate and regular rhythm.     Heart sounds: Normal heart sounds.  Pulmonary:     Effort: Pulmonary effort is normal.     Breath sounds: Normal breath sounds.  Skin:    General: Skin is warm and dry.  Neurological:     General: No focal deficit present.     Mental Status: She is alert.  Psychiatric:        Mood and Affect: Mood normal.        Behavior: Behavior normal.     Assessment/Plan: Please see individual problem list.  History of sleep apnea Assessment & Plan: A sleep study is recommended to evaluate for sleep apnea due to previous CPAP use and increased daytime sleepiness. Refer to Pulmonology.  Orders: -     Pulmonary Visit  Insomnia, unspecified type Assessment & Plan: Chronic insomnia presents with difficulty initiating and maintaining sleep, likely worsened by body aches and potential medication side effects. A sleep study is recommended to evaluate for sleep apnea due to previous CPAP use. Order a sleep study, advise on establishing a consistent sleep routine, and encourage increased daytime activity. Continue Trazodone and magnesium at  bedtime.    Breast pain Assessment & Plan: New onset breast pain with white and yellow discharge suggests possible cystic breasts. Order a mammogram for further evaluation.   Orders: -     MM 3D DIAGNOSTIC MAMMOGRAM BILATERAL BREAST; Future -     POCT urine pregnancy  Mood disorder [facility]) Assessment & Plan: She is on multiple psychiatric medications and reports visual hallucinations and malaise. Follow up with psychiatry for medication review and potential adjustment.   Fibromyalgia Assessment & Plan: Body aches may be related to medication side effects or fibromyalgia symptoms. Restart meloxicam for pain management and advise against concurrent use of ibuprofen. Follow up with Physical Medicine and Pain Management.   Orders: -     Meloxicam; Take 1 tablet (15 mg total) by  mouth daily.  Dispense: 90 tablet; Refill: 3 -     POCT Urinalysis Dipstick (Automated)  Gastroesophageal reflux disease, unspecified whether esophagitis present Assessment & Plan: Significant stomach cramping and acid reflux symptoms are present, possibly due to medication side effects. Continue Protonix for acid reflux and monitor symptoms, reporting any changes. Encouraged to monitor diet.      Return in about 3 months (around 06/04/2024), or if symptoms worsen or fail to improve, for Follow up.   Jamie Dicker, NP-C [facility]

## 2024-03-04 ENCOUNTER — Ambulatory Visit (INDEPENDENT_AMBULATORY_CARE_PROVIDER_SITE_OTHER): Admitting: Nurse Practitioner

## 2024-03-04 ENCOUNTER — Encounter: Payer: Self-pay | Admitting: Nurse Practitioner

## 2024-03-04 ENCOUNTER — Other Ambulatory Visit: Payer: Self-pay | Admitting: Nurse Practitioner

## 2024-03-04 VITALS — BP 122/76 | HR 61 | Temp 97.5°F | Ht 64.0 in | Wt 205.4 lb

## 2024-03-04 DIAGNOSIS — F39 Unspecified mood [affective] disorder: Secondary | ICD-10-CM | POA: Diagnosis not present

## 2024-03-04 DIAGNOSIS — N644 Mastodynia: Secondary | ICD-10-CM | POA: Diagnosis not present

## 2024-03-04 DIAGNOSIS — Z8669 Personal history of other diseases of the nervous system and sense organs: Secondary | ICD-10-CM | POA: Insufficient documentation

## 2024-03-04 DIAGNOSIS — G894 Chronic pain syndrome: Secondary | ICD-10-CM

## 2024-03-04 DIAGNOSIS — G4700 Insomnia, unspecified: Secondary | ICD-10-CM

## 2024-03-04 DIAGNOSIS — M797 Fibromyalgia: Secondary | ICD-10-CM | POA: Diagnosis not present

## 2024-03-04 DIAGNOSIS — K219 Gastro-esophageal reflux disease without esophagitis: Secondary | ICD-10-CM

## 2024-03-04 LAB — POC URINALSYSI DIPSTICK (AUTOMATED)
Bilirubin, UA: NEGATIVE
Blood, UA: NEGATIVE
Glucose, UA: NEGATIVE
Ketones, UA: NEGATIVE
Leukocytes, UA: NEGATIVE
Nitrite, UA: NEGATIVE
Protein, UA: NEGATIVE
Spec Grav, UA: 1.015 (ref 1.010–1.025)
Urobilinogen, UA: 0.2 U/dL
pH, UA: 6 (ref 5.0–8.0)

## 2024-03-04 LAB — POCT URINE PREGNANCY: Preg Test, Ur: NEGATIVE

## 2024-03-04 MED ORDER — MELOXICAM 15 MG PO TABS: 15.0000 mg | ORAL_TABLET | Freq: Every day | ORAL | 3 refills | Status: AC

## 2024-03-04 NOTE — Patient Instructions (Signed)
 YOUR MAMMOGRAM IS DUE, PLEASE CALL AND GET THIS SCHEDULED! [facility] - call [phone]

## 2024-03-08 ENCOUNTER — Encounter: Payer: Self-pay | Admitting: Sleep Medicine

## 2024-03-08 ENCOUNTER — Other Ambulatory Visit (HOSPITAL_COMMUNITY): Payer: Self-pay

## 2024-03-08 ENCOUNTER — Ambulatory Visit: Admitting: Sleep Medicine

## 2024-03-08 VITALS — BP 110/72 | HR 69 | Temp 97.1°F | Ht 64.0 in | Wt 206.6 lb

## 2024-03-08 DIAGNOSIS — Z6835 Body mass index (BMI) 35.0-35.9, adult: Secondary | ICD-10-CM

## 2024-03-08 DIAGNOSIS — F411 Generalized anxiety disorder: Secondary | ICD-10-CM | POA: Diagnosis not present

## 2024-03-08 DIAGNOSIS — E669 Obesity, unspecified: Secondary | ICD-10-CM | POA: Diagnosis not present

## 2024-03-08 DIAGNOSIS — G4733 Obstructive sleep apnea (adult) (pediatric): Secondary | ICD-10-CM | POA: Diagnosis not present

## 2024-03-08 DIAGNOSIS — E66812 Obesity, class 2: Secondary | ICD-10-CM

## 2024-03-08 NOTE — Telephone Encounter (Signed)
error 
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
## 2024-03-08 NOTE — Patient Instructions (Signed)
 Marland Kitchen

## 2024-03-10 NOTE — Assessment & Plan Note (Addendum)
 Body aches may be related to medication side effects or fibromyalgia symptoms. Restart meloxicam for pain management and advise against concurrent use of ibuprofen. Follow up with Physical Medicine and Pain Management.

## 2024-03-10 NOTE — Assessment & Plan Note (Signed)
 Chronic insomnia presents with difficulty initiating and maintaining sleep, likely worsened by body aches and potential medication side effects. A sleep study is recommended to evaluate for sleep apnea due to previous CPAP use. Order a sleep study, advise on establishing a consistent sleep routine, and encourage increased daytime activity. Continue Trazodone and magnesium at bedtime.

## 2024-03-10 NOTE — Assessment & Plan Note (Signed)
 Significant stomach cramping and acid reflux symptoms are present, possibly due to medication side effects. Continue Protonix for acid reflux and monitor symptoms, reporting any changes. Encouraged to monitor diet.

## 2024-03-10 NOTE — Assessment & Plan Note (Signed)
 New onset breast pain with white and yellow discharge suggests possible cystic breasts. Order a mammogram for further evaluation.

## 2024-03-10 NOTE — Assessment & Plan Note (Signed)
 She is on multiple psychiatric medications and reports visual hallucinations and malaise. Follow up with psychiatry for medication review and potential adjustment.

## 2024-03-10 NOTE — Assessment & Plan Note (Signed)
 A sleep study is recommended to evaluate for sleep apnea due to previous CPAP use and increased daytime sleepiness. Refer to Pulmonology.

## 2024-03-14 ENCOUNTER — Emergency Department

## 2024-03-14 ENCOUNTER — Ambulatory Visit (INDEPENDENT_AMBULATORY_CARE_PROVIDER_SITE_OTHER): Admitting: Internal Medicine

## 2024-03-14 ENCOUNTER — Encounter: Payer: Self-pay | Admitting: Internal Medicine

## 2024-03-14 ENCOUNTER — Other Ambulatory Visit: Payer: Self-pay

## 2024-03-14 ENCOUNTER — Ambulatory Visit: Payer: Self-pay

## 2024-03-14 ENCOUNTER — Emergency Department
Admission: EM | Admit: 2024-03-14 | Discharge: 2024-03-14 | Disposition: A | Discharge status: Home / Self Care | Attending: Emergency Medicine | Admitting: Emergency Medicine

## 2024-03-14 VITALS — BP 116/68 | HR 67 | Temp 98.0°F | Resp 16 | Ht 64.0 in | Wt 205.8 lb

## 2024-03-14 DIAGNOSIS — R319 Hematuria, unspecified: Secondary | ICD-10-CM | POA: Diagnosis not present

## 2024-03-14 DIAGNOSIS — J45909 Unspecified asthma, uncomplicated: Secondary | ICD-10-CM | POA: Insufficient documentation

## 2024-03-14 DIAGNOSIS — R310 Gross hematuria: Secondary | ICD-10-CM

## 2024-03-14 DIAGNOSIS — N12 Tubulo-interstitial nephritis, not specified as acute or chronic: Secondary | ICD-10-CM | POA: Diagnosis not present

## 2024-03-14 DIAGNOSIS — R300 Dysuria: Secondary | ICD-10-CM

## 2024-03-14 DIAGNOSIS — M549 Dorsalgia, unspecified: Secondary | ICD-10-CM | POA: Diagnosis not present

## 2024-03-14 LAB — URINALYSIS, ROUTINE W REFLEX MICROSCOPIC
Bilirubin Urine: NEGATIVE
Glucose, UA: NEGATIVE mg/dL
Hgb urine dipstick: NEGATIVE
Ketones, ur: NEGATIVE mg/dL
Nitrite: NEGATIVE
Protein, ur: NEGATIVE mg/dL
Specific Gravity, Urine: 1.015 (ref 1.005–1.030)
pH: 6 (ref 5.0–8.0)

## 2024-03-14 LAB — POC URINE PREG, ED: Preg Test, Ur: NEGATIVE

## 2024-03-14 LAB — POC URINALSYSI DIPSTICK (AUTOMATED)
Bilirubin, UA: NEGATIVE
Glucose, UA: NEGATIVE
Ketones, UA: NEGATIVE
Leukocytes, UA: NEGATIVE
Nitrite, UA: NEGATIVE
Protein, UA: NEGATIVE
Spec Grav, UA: <=1.005 — AB (ref 1.010–1.025)
Urobilinogen, UA: 0.2 U/dL
pH, UA: 5.5 (ref 5.0–8.0)

## 2024-03-14 LAB — BASIC METABOLIC PANEL WITH GFR
Anion gap: 8 (ref 5–15)
BUN: 13 mg/dL (ref 6–20)
CO2: 28 mmol/L (ref 22–32)
Calcium: 9.3 mg/dL (ref 8.9–10.3)
Chloride: 100 mmol/L (ref 98–111)
Creatinine, Ser: 0.78 mg/dL (ref 0.44–1.00)
GFR, Estimated: >60 mL/min (ref >60)
Glucose, Bld: 102 mg/dL — ABNORMAL HIGH (ref 70–99)
Potassium: 3.8 mmol/L (ref 3.5–5.1)
Sodium: 136 mmol/L (ref 135–145)

## 2024-03-14 LAB — CBC
HCT: 36.5 % (ref 36.0–46.0)
Hemoglobin: 12.5 g/dL (ref 12.0–15.0)
MCH: 31.1 pg (ref 26.0–34.0)
MCHC: 34.2 g/dL (ref 30.0–36.0)
MCV: 90.8 fL (ref 80.0–100.0)
Platelets: 297 10*3/uL (ref 150–400)
RBC: 4.02 MIL/uL (ref 3.87–5.11)
RDW: 13 % (ref 11.5–15.5)
WBC: 11.3 10*3/uL — ABNORMAL HIGH (ref 4.0–10.5)
nRBC: 0 % (ref 0.0–0.2)

## 2024-03-14 MED ORDER — IBUPROFEN 600 MG PO TABS
600.0000 mg | ORAL_TABLET | Freq: Once | ORAL | Status: AC
  Administered 2024-03-14: 600 mg via ORAL
  Filled 2024-03-14: qty 1

## 2024-03-14 MED ORDER — CEPHALEXIN 500 MG PO CAPS
500.0000 mg | ORAL_CAPSULE | Freq: Once | ORAL | Status: AC
  Administered 2024-03-14: 500 mg via ORAL
  Filled 2024-03-14: qty 1

## 2024-03-14 MED ORDER — CEFPODOXIME PROXETIL 200 MG PO TABS: 200.0000 mg | ORAL_TABLET | Freq: Two times a day (BID) | ORAL | 0 refills | Status: AC

## 2024-03-14 MED ORDER — CEPHALEXIN 500 MG PO CAPS
500.0000 mg | ORAL_CAPSULE | Freq: Once | ORAL | Status: DC
Start: 2024-03-14 — End: 2024-03-14

## 2024-03-14 NOTE — Telephone Encounter (Signed)
 Needs a urine dip, micro and culture. Collect and run urine prior to being seen.

## 2024-03-14 NOTE — Assessment & Plan Note (Signed)
 Increased back pain, abdominal pain, dysuria and hematuria with increased urinary frequency. Discussed possible etiologies, including bladder/kidney infection, kidney stone. Also with increased abdominal pain. Urine dip revealed only trace blood. No clear evidence of infection. Given urine results with increasing pain (both abdominal and back pain), needs further evaluation. Will need labs, including cbc with metabolic panel. Also discussed the need for CT scan. Discussed with her mother and father. ER notified. To ER for evaluation.

## 2024-03-14 NOTE — ED Provider Notes (Signed)
 [facility] Provider Note    Event Date/Time   First MD Initiated Contact with Patient 03/14/24 1921     (approximate)   History   Hematuria   HPI  Jamie Ross is a 38 y.o. female with a history of developmental delay, ADHD, asthma, and pyelonephritis who presents with gross hematuria over the last few days associated with dysuria, frequency, and bilateral low back pain.  She has had some subjective chills as well.  She was seen in urgent care today and referred for further evaluation.   I reviewed the past medical records; prior to today, the patient's most recent outpatient encounter was on 4/1 with sleep medicine for followup of OSA.     Physical Exam   Triage Vital Signs: ED Triage Vitals  Encounter Vitals Group     BP 03/14/24 1641 118/73     Systolic BP Percentile --      Diastolic BP Percentile --      Pulse Rate 03/14/24 1641 (!) 57     Resp 03/14/24 1641 20     Temp 03/14/24 1641 97.6 F (36.4 C)     Temp Source 03/14/24 1641 Oral     SpO2 03/14/24 1641 94 %     Weight 03/14/24 1642 205 lb (93 kg)     Height 03/14/24 1642 5\' 4"  (1.626 m)     Head Circumference --      Peak Flow --      Pain Score 03/14/24 1642 10     Pain Loc --      Pain Education --      Exclude from Growth Chart --     Most recent vital signs: Vitals:   03/14/24 2024 03/14/24 2330  BP:  118/77  Pulse:  66  Resp:  20  Temp: 97.6 F (36.4 C)   SpO2:  98%     General: Awake, no distress.  CV:  Good peripheral perfusion.  Resp:  Normal effort.  Abd:  No distention. Soft and nontender.  Other:  Mild left CVA tenderness.    ED Results / Procedures / Treatments   Labs (all labs ordered are listed, but only abnormal results are displayed) Labs Reviewed  URINALYSIS, ROUTINE W REFLEX MICROSCOPIC - Abnormal; Notable for the following components:      Result Value   Color, Urine YELLOW (*)    APPearance HAZY (*)    Leukocytes,Ua SMALL (*)     Bacteria, UA RARE (*)    All other components within normal limits  BASIC METABOLIC PANEL WITH GFR - Abnormal; Notable for the following components:   Glucose, Bld 102 (*)    All other components within normal limits  CBC - Abnormal; Notable for the following components:   WBC 11.3 (*)    All other components within normal limits  POC URINE PREG, ED - Normal     EKG     RADIOLOGY  CT renal stone study: I independently viewed and interpreted the images; there is no ureteral stone or hydronephrosis.  Radiology report indicates the following:  IMPRESSION:  1. No acute or active process within the abdomen or pelvis.  2. Evidence of prior cholecystectomy.      PROCEDURES:  Critical Care performed: No  Procedures   MEDICATIONS ORDERED IN ED: Medications  ibuprofen (ADVIL) tablet 600 mg (600 mg Oral Given 03/14/24 2005)  cephALEXin (KEFLEX) capsule 500 mg (500 mg Oral Given 03/14/24 2322)     IMPRESSION /  MDM / ASSESSMENT AND PLAN / ED COURSE  I reviewed the triage vital signs and the nursing notes.  38 year old female with PMH as noted above presents with hematuria along with bilateral low back pain for the last few days.   Differential diagnosis includes, but is not limited to, cystitis, pyelonephritis, ureteral stone.  UA shows 11-20 WBCs and some bacteria.  CBC shows mild leukocytosis.  Creatinine is normal.  We will obtain a CT for further evaluation.   Patient's presentation is most consistent with acute complicated illness / injury requiring diagnostic workup.  ----------------------------------------- 11:10 PM on 03/14/2024 -----------------------------------------  CT is negative.  Given the urinalysis findings and the back/flank pain I will treat empirically for mild pyelonephritis.  There is no indication for inpatient admission.  Since we do not have cefdinir or cefpodoxime on formulary, I give the patient a dose of Keflex in the ED and have prescribed  cefpodoxime for home.  I counseled the patient and her family members on the results of the workup and plan of care.  I gave strict return precautions and they expressed understanding.   FINAL CLINICAL IMPRESSION(S) / ED DIAGNOSES   Final diagnoses:  Gross hematuria  Pyelonephritis     Rx / DC Orders   ED Discharge Orders          Ordered    cefpodoxime (VANTIN) 200 MG tablet  2 times daily        03/14/24 2302             Note:  This document was prepared using Dragon voice recognition software and may include unintentional dictation errors.    Dionne Bucy, MD 03/14/24 2350

## 2024-03-14 NOTE — Discharge Instructions (Addendum)
 Take the antibiotic as prescribed and finish the full course.  Follow up with the urologist in case you need further workup for the bleeding.  Return to the ER for new or worsening flank or abdominal pain, blood in the urine, fever, or any other new or worsening symptoms that concern you.

## 2024-03-14 NOTE — ED Triage Notes (Signed)
 Patient reports hematuria and low back pain; sent over by PCP for further workup.

## 2024-03-14 NOTE — Telephone Encounter (Signed)
 Chief Complaint: Urinary frequency Symptoms: low back pain, hematuria, mild dysuria Frequency: x 3 days Pertinent Negatives: Patient denies fever, V/D Disposition: [] ED /[] Urgent Care (no appt availability in office) / [x] Appointment(In office/virtual)/ []  [facility]/ [] Home Care/ [] Refused Recommended Disposition /[] [facility]/ []  Follow-up with PCP Additional Notes: Pt and mother calling in, pt began with urinary frequency x 3 days ago, notes hematuria and low back pain, notes mild dysuria. Denies fever, N/V. OV scheduled today. This RN educated pt on home care, new-worsening symptoms, when to call back/seek emergent care. Pt verbalized understanding and agrees to plan.    Copied from CRM [id number]. Topic: Clinical - Red Word Triage >> Mar 14, 2024 12:51 PM Danika B wrote: [facility] that prompted transfer to Nurse Triage: Back pain, blood in urine, frequent urination. Mother calling in initially to schedule, but aware symptoms need to be triaged. Reason for Disposition  Urinating more frequently than usual (i.e., frequency)  Answer Assessment - Initial Assessment Questions 1. SYMPTOM: "What's the main symptom you're concerned about?" (e.g., frequency, incontinence)     Urinary frequency 2. ONSET: "When did the  frequency  start?"     X 3 days 3. PAIN: "Is there any pain?" If Yes, ask: "How bad is it?" (Scale: 1-10; mild, moderate, severe)     mild 4. CAUSE: "What do you think is causing the symptoms?"     Possible UTI 5. OTHER SYMPTOMS: "Do you have any other symptoms?" (e.g., blood in urine, fever, flank pain, pain with urination)     Hematuria, nausea, low back pain  Protocols used: Urinary Symptoms-A-AH

## 2024-03-14 NOTE — Telephone Encounter (Signed)
 FYI- seeing you at 3:30
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
## 2024-03-14 NOTE — Telephone Encounter (Signed)
 URINE ORDERED AND CUP PLACED UP FRONT

## 2024-03-15 LAB — URINE CULTURE
MICRO NUMBER:: 16297301
Result:: NO GROWTH
SPECIMEN QUALITY:: ADEQUATE

## 2024-03-15 LAB — URINALYSIS, MICROSCOPIC ONLY: RBC / HPF: NONE SEEN (ref 0–?)

## 2024-03-15 NOTE — Assessment & Plan Note (Signed)
 Describes gross hematuria and increased back/flank pain and abdominal pain. Also associated increased frequency and dysuria. Urine dip - trace blood. Discussed possible kidney stone vs infection (bladder/kidney). With increased pain and exam findings, discussed the need for further w/up and evaluation, including labs, CT scan. Discussed referral to ER. Agreeable.  ER notified.

## 2024-03-17 ENCOUNTER — Other Ambulatory Visit: Payer: Self-pay | Admitting: Nurse Practitioner

## 2024-03-17 ENCOUNTER — Ambulatory Visit: Payer: 59 | Admitting: Nurse Practitioner

## 2024-03-17 DIAGNOSIS — M797 Fibromyalgia: Secondary | ICD-10-CM

## 2024-03-21 ENCOUNTER — Telehealth: Payer: Self-pay | Admitting: Pulmonary Disease

## 2024-03-21 ENCOUNTER — Telehealth: Payer: Self-pay

## 2024-03-21 NOTE — Telephone Encounter (Signed)
 Copied from CRM [id number]. Topic: Clinical - Prescription Issue >> Mar 21, 2024  2:40 PM Crispin Dolphin wrote: Reason for CRM: Patient mother called. States they picked up medication yesterday from [facility] that was prescribed by M. Arnett and they were able to pick it up but they wanted to leave a message that they no longer use [facility]. All future prescriptions should be sent to [facility]. Thank You

## 2024-03-21 NOTE — Telephone Encounter (Signed)
 Copied from CRM [id number]. Topic: Appointments - Scheduling Inquiry for Clinic >> Mar 18, 2024  9:40 AM Isabell A wrote: Reason for CRM: Jansen Goodpasture calling, needs assistance with scheduling home sleep study test for patient.   Callback number: [phone]  Please reach out to patient

## 2024-03-22 NOTE — Telephone Encounter (Signed)
 I have spoke with the family and they will be picking up the HST machine on 03/24/24 @ 1:30pm

## 2024-03-24 ENCOUNTER — Ambulatory Visit
Admission: RE | Admit: 2024-03-24 | Discharge: 2024-03-24 | Disposition: A | Discharge status: Home / Self Care | Source: Ambulatory Visit | Attending: Nurse Practitioner | Admitting: Nurse Practitioner

## 2024-03-24 ENCOUNTER — Ambulatory Visit

## 2024-03-24 DIAGNOSIS — G4733 Obstructive sleep apnea (adult) (pediatric): Secondary | ICD-10-CM

## 2024-03-24 DIAGNOSIS — N644 Mastodynia: Secondary | ICD-10-CM | POA: Insufficient documentation

## 2024-03-29 ENCOUNTER — Encounter: Payer: Self-pay | Admitting: Oncology

## 2024-03-29 ENCOUNTER — Inpatient Hospital Stay: Payer: 59 | Attending: Oncology

## 2024-03-29 ENCOUNTER — Inpatient Hospital Stay (HOSPITAL_BASED_OUTPATIENT_CLINIC_OR_DEPARTMENT_OTHER): Payer: 59 | Admitting: Oncology

## 2024-03-29 VITALS — BP 104/60 | HR 60 | Temp 98.0°F | Resp 18 | Ht 64.0 in | Wt 204.9 lb

## 2024-03-29 DIAGNOSIS — D72828 Other elevated white blood cell count: Secondary | ICD-10-CM

## 2024-03-29 DIAGNOSIS — D720 Genetic anomalies of leukocytes: Secondary | ICD-10-CM | POA: Insufficient documentation

## 2024-03-29 LAB — CBC WITH DIFFERENTIAL/PLATELET
Abs Immature Granulocytes: 0.06 10*3/uL (ref 0.00–0.07)
Basophils Absolute: 0.1 10*3/uL (ref 0.0–0.1)
Basophils Relative: 1 %
Eosinophils Absolute: 0.2 10*3/uL (ref 0.0–0.5)
Eosinophils Relative: 2 %
HCT: 36.9 % (ref 36.0–46.0)
Hemoglobin: 12.3 g/dL (ref 12.0–15.0)
Immature Granulocytes: 1 %
Lymphocytes Relative: 33 %
Lymphs Abs: 3.1 10*3/uL (ref 0.7–4.0)
MCH: 29.9 pg (ref 26.0–34.0)
MCHC: 33.3 g/dL (ref 30.0–36.0)
MCV: 89.8 fL (ref 80.0–100.0)
Monocytes Absolute: 0.6 10*3/uL (ref 0.1–1.0)
Monocytes Relative: 6 %
Neutro Abs: 5.4 10*3/uL (ref 1.7–7.7)
Neutrophils Relative %: 57 %
Platelets: 296 10*3/uL (ref 150–400)
RBC: 4.11 MIL/uL (ref 3.87–5.11)
RDW: 12.9 % (ref 11.5–15.5)
WBC: 9.4 10*3/uL (ref 4.0–10.5)
nRBC: 0 % (ref 0.0–0.2)
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
## 2024-03-31 DIAGNOSIS — G4733 Obstructive sleep apnea (adult) (pediatric): Secondary | ICD-10-CM | POA: Diagnosis not present

## 2024-04-04 ENCOUNTER — Telehealth: Payer: Self-pay

## 2024-04-04 DIAGNOSIS — G4733 Obstructive sleep apnea (adult) (pediatric): Secondary | ICD-10-CM

## 2024-04-04 NOTE — Telephone Encounter (Signed)
 Please notify patient that HST revealed moderate OSA, recommend proceeding with APAP therapy set to 4-16 cm H2O, EPR 3 with the Airtouch N30i nasal mask. Please also schedule a 3 month follow up visit if patient wishes to proceed with CPAP therapy. Thanks   Lm on Jamie Ross's secure voicemail notifying him of the sleep study results. I asked that he give our office a call back and let us  know if they would like to go ahead with the APAP or schedule an appt to go over the results in person.  E2C2 please advise if the patient calls back.

## 2024-04-04 NOTE — Telephone Encounter (Signed)
 The sleep study has been read but no one has let the patient know the results yet. No DME order has been placed

## 2024-04-04 NOTE — Telephone Encounter (Signed)
 Copied from CRM [id number]. Topic: Clinical - Medical Advice >> Apr 04, 2024 12:38 PM Tyronne Galloway wrote: Reason for CRM: Pt's father Mont Antis called in requesting an update on the equipment he picked up on 4/17 for the patient. Please call at [phone] ok to leave a vm.

## 2024-04-05 NOTE — Telephone Encounter (Signed)
 Referral placed. Randy notified. NFN.

## 2024-04-05 NOTE — Telephone Encounter (Signed)
 Patient's father Mont Antis advised of HST. He is requesting Inspire for patient. He reports this was discussed during last visit. Please advise.

## 2024-04-07 ENCOUNTER — Ambulatory Visit: Admitting: Nurse Practitioner

## 2024-04-07 VITALS — BP 110/71 | HR 73 | Temp 98.2°F | Ht 64.0 in | Wt 206.6 lb

## 2024-04-07 DIAGNOSIS — K5909 Other constipation: Secondary | ICD-10-CM | POA: Diagnosis not present

## 2024-04-07 DIAGNOSIS — N6452 Nipple discharge: Secondary | ICD-10-CM

## 2024-04-07 DIAGNOSIS — R1084 Generalized abdominal pain: Secondary | ICD-10-CM | POA: Diagnosis not present

## 2024-04-07 DIAGNOSIS — R110 Nausea: Secondary | ICD-10-CM

## 2024-04-07 DIAGNOSIS — K219 Gastro-esophageal reflux disease without esophagitis: Secondary | ICD-10-CM

## 2024-04-07 LAB — POC URINALSYSI DIPSTICK (AUTOMATED)
Bilirubin, UA: NEGATIVE
Blood, UA: NEGATIVE
Glucose, UA: NEGATIVE
Ketones, UA: NEGATIVE
Leukocytes, UA: NEGATIVE
Nitrite, UA: NEGATIVE
Protein, UA: NEGATIVE
Spec Grav, UA: <=1.005 — AB (ref 1.010–1.025)
Urobilinogen, UA: 0.2 U/dL
pH, UA: 6 (ref 5.0–8.0)

## 2024-04-07 MED ORDER — PANTOPRAZOLE SODIUM 40 MG PO TBEC: 40.0000 mg | DELAYED_RELEASE_TABLET | Freq: Two times a day (BID) | ORAL | 1 refills | Status: AC

## 2024-04-07 MED ORDER — METOCLOPRAMIDE HCL 10 MG PO TABS: 10.0000 mg | ORAL_TABLET | Freq: Three times a day (TID) | ORAL | 2 refills | Status: AC | PRN

## 2024-04-07 NOTE — Progress Notes (Signed)
 Jamie Burkitt, NP-C Phone: [phone]  Jamie Ross is a 38 y.o. female who presents today for abdominal pain.   Discussed the use of AI scribe software for clinical note transcription with the patient, who gave verbal consent to proceed.  History of Present Illness   Jamie Ross is a 38 year old female who presents with abdominal pain and nipple discharge. She is accompanied by her mother.  She experiences chronic abdominal pain located in the middle of her abdomen, between her belly button and upper abdomen, which worsens at night and affects her sleep. She has a history of chronic constipation and poor eating habits. Previous diagnostic tests, including a CT scan of the abdomen and pelvis, were normal, and she underwent an EGD and colonoscopy in 2015. She takes Linzess  for constipation, Protonix  for acid reflux, and uses Zofran  and Reglan  as needed for nausea. Bowel movements occur once a day, sometimes presenting as diarrhea or small round balls. She frequently experiences nausea and heaving.  She reports a daily milky and clear nipple discharge. Previous mammogram and ultrasounds were normal. She does not have a menstrual cycle due to a previous ablation but still experiences hormonal symptoms such as hot flashes.  Her social history includes lactose intolerance, and she avoids dairy products like milk and ice cream. She drinks lactose-free milk and is cautious about her dairy intake. She reports difficulty with water intake, often feeling nauseous when drinking large amounts, and tries to take small sips instead.      Social History   Tobacco Use  Smoking Status Never  Smokeless Tobacco Never    Current Outpatient Medications on File Prior to Visit  Medication Sig Dispense Refill   acetaminophen  (TYLENOL ) 650 MG CR tablet Take 1,300 mg by mouth every 8 (eight) hours as needed.     acetaminophen -codeine (TYLENOL  #3) 300-30 MG tablet Take 1-2 tablets by mouth every 4  (four) hours as needed.     albuterol  (VENTOLIN  HFA) 108 (90 Base) MCG/ACT inhaler TAKE 2 PUFFS BY MOUTH EVERY 6 HOURS AS NEEDED FOR WHEEZE OR SHORTNESS OF BREATH 8.5 each 5   amantadine (SYMMETREL) 100 MG capsule Take 100 mg by mouth 2 (two) times daily.     Azelastine  HCl 137 MCG/SPRAY SOLN PLACE 1 SPRAY INTO BOTH NOSTRILS 2 (TWO) TIMES DAILY. USE IN EACH NOSTRIL AS DIRECTED 90 mL 1   budesonide -formoterol  (SYMBICORT ) 160-4.5 MCG/ACT inhaler Inhale 2 puffs into the lungs 2 (two) times daily. 1 Inhaler 0   Cyanocobalamin  (CVS B12 GUMMIES PO) Take by mouth.     fluticasone  (FLONASE ) 50 MCG/ACT nasal spray 1 spray into each nostril daily. 15.8 mL 3   haloperidol  (HALDOL ) 10 MG tablet Take 10 mg by mouth 2 (two) times daily.     hydrOXYzine  (ATARAX ) 25 MG tablet Take 12.5-25 mg by mouth 2 (two) times daily.     LINZESS  72 MCG capsule Take 72 mcg by mouth daily.     meloxicam  (MOBIC ) 15 MG tablet Take 1 tablet (15 mg total) by mouth daily. 90 tablet 3   prazosin  (MINIPRESS ) 5 MG capsule Take 5 mg by mouth at bedtime.     Probiotic Product (ALIGN DUALBIOTIC) CHEW Chew 1 capsule by mouth as needed.     propranolol  ER (INDERAL  LA) 60 MG 24 hr capsule Take 1 capsule (60 mg total) by mouth daily. 90 capsule 0   Semaglutide -Weight Management 0.25 MG/0.5ML SOAJ Inject 0.25 mg into the skin once a week. 2 mL 0  Semaglutide -Weight Management 0.5 MG/0.5ML SOAJ Inject 0.5 mg into the skin once a week. 2 mL 0   sertraline (ZOLOFT) 100 MG tablet Take 100 mg by mouth daily.     tobramycin-dexamethasone (TOBRADEX) ophthalmic solution Place 1 drop into the left eye 2 (two) times daily.     traZODone  (DESYREL ) 150 MG tablet Take 1 tablet (150 mg total) by mouth at bedtime. 90 tablet 1   [DISCONTINUED] hydrochlorothiazide (MICROZIDE) 12.5 MG capsule Take 12.5 mg by mouth daily.     [DISCONTINUED] potassium chloride SA (K-DUR,KLOR-CON) 20 MEQ tablet Take 20 mEq by mouth daily.     No current facility-administered  medications on file prior to visit.     ROS see history of present illness  Objective  Physical Exam Vitals:   04/07/24 1515  BP: 110/71  Pulse: 73  Temp: 98.2 F (36.8 C)  SpO2: 98%    BP Readings from Last 3 Encounters:  04/07/24 110/71  03/29/24 104/60  03/14/24 118/77   Wt Readings from Last 3 Encounters:  04/07/24 206 lb 9.6 oz (93.7 kg)  03/29/24 204 lb 14.4 oz (92.9 kg)  03/14/24 205 lb (93 kg)    Physical Exam Constitutional:      General: She is not in acute distress.    Appearance: Normal appearance.  HENT:     Head: Normocephalic.  Cardiovascular:     Rate and Rhythm: Normal rate and regular rhythm.     Heart sounds: Normal heart sounds.  Pulmonary:     Effort: Pulmonary effort is normal.     Breath sounds: Normal breath sounds.  Abdominal:     General: Abdomen is flat. Bowel sounds are normal.     Palpations: Abdomen is soft.     Tenderness: There is generalized abdominal tenderness and tenderness in the right upper quadrant. There is no guarding or rebound.  Skin:    General: Skin is warm and dry.  Neurological:     General: No focal deficit present.     Mental Status: She is alert.  Psychiatric:        Mood and Affect: Mood normal.        Behavior: Behavior normal.      Assessment/Plan: Please see individual problem list.  Generalized abdominal pain Assessment & Plan: Chronic abdominal pain persists despite normal imaging and labs. The differential includes acid reflux, hiatal hernia, and potential ulcer, with no evidence of appendicitis or gallbladder issues. Increase Protonix  to twice daily, morning and evening.Follow up with GI for EGD to evaluate for ulcer, acid reflux, or hiatal hernia. Order an abdominal ultrasound to assess for liver, pancreas, or other abdominal issues. Encourage increased water intake and dietary modifications to identify potential food triggers.  Orders: -     POCT Urinalysis Dipstick (Automated) -     US   Abdomen Complete; Future  Nipple discharge Assessment & Plan: Nipple discharge is milky and clear, with a differential including elevated prolactin levels. There is no evidence of bloody discharge. Order prolactin level and thyroid  function tests. If prolactin is elevated, discuss further management options. If normal, consider follow up with OBGYN for further evaluation.   Orders: -     Prolactin -     TSH  Gastroesophageal reflux disease, unspecified whether esophagitis present Assessment & Plan: She experiences acid reflux with abdominal pain, belching, and potential bloating, currently managed with Protonix  once daily. Increase Protonix  to twice daily, morning and evening. Follow up with GI for EGD to evaluate for acid  reflux or hiatal hernia.  Orders: -     Pantoprazole  Sodium; Take 1 tablet (40 mg total) by mouth 2 (two) times daily before a meal.  Dispense: 180 tablet; Refill: 1  Constipation, chronic Assessment & Plan: Chronic constipation is managed with Linzess  and dietary modifications. Dehydration may contribute to symptoms. Continue Linzess  as prescribed. Encourage increased water intake and dietary modifications.   Chronic nausea -     Metoclopramide  HCl; Take 1 tablet (10 mg total) by mouth every 8 (eight) hours as needed for nausea.  Dispense: 30 tablet; Refill: 2     Return if symptoms worsen or fail to improve, for as scheduled.   Jamie Burkitt, NP-C [facility]

## 2024-04-08 ENCOUNTER — Telehealth: Payer: Self-pay

## 2024-04-08 ENCOUNTER — Other Ambulatory Visit: Payer: Self-pay

## 2024-04-08 LAB — PROLACTIN: Prolactin: 110.9 ng/mL — ABNORMAL HIGH

## 2024-04-08 LAB — TSH: TSH: 1.4 u[IU]/mL (ref 0.35–5.50)

## 2024-04-08 NOTE — Telephone Encounter (Signed)
 Copied from CRM [id number]. Topic: General - Other >> Apr 08, 2024 12:34 PM Emylou G wrote: Reason for CRM: Patient looking to refill her indigestion pill but she can't remember the name.. number on file is good.. the mom had call.Aaron Aas

## 2024-04-08 NOTE — Telephone Encounter (Signed)
 Spoke to pt's mother. Mother wanted to know if Protonix  was called in informed mom that medication was sent to pharmacy yesterday. Mom expressed she didn't need anything else at this time.

## 2024-04-08 NOTE — Telephone Encounter (Signed)
 Medication was sent in yesterday to [facility]   Copied from CRM [id number]. Topic: General - Other >> Apr 08, 2024 12:34 PM Emylou G wrote: Reason for CRM: Patient looking to refill her indigestion pill but she can't remember the name.. number on file is good.. the mom had call.. >> Apr 08, 2024  2:18 PM Allyne Areola wrote: Patient's mother is calling to follow up on the  indigestion medication.

## 2024-04-12 ENCOUNTER — Other Ambulatory Visit: Payer: Self-pay | Admitting: Nurse Practitioner

## 2024-04-12 DIAGNOSIS — R7989 Other specified abnormal findings of blood chemistry: Secondary | ICD-10-CM

## 2024-04-13 ENCOUNTER — Ambulatory Visit
Admission: RE | Admit: 2024-04-13 | Discharge: 2024-04-13 | Disposition: A | Discharge status: Home / Self Care | Source: Ambulatory Visit | Attending: Nurse Practitioner | Admitting: Nurse Practitioner

## 2024-04-13 ENCOUNTER — Ambulatory Visit: Payer: Self-pay

## 2024-04-13 ENCOUNTER — Encounter (INDEPENDENT_AMBULATORY_CARE_PROVIDER_SITE_OTHER): Payer: Self-pay | Admitting: Otolaryngology

## 2024-04-13 DIAGNOSIS — R1084 Generalized abdominal pain: Secondary | ICD-10-CM | POA: Insufficient documentation

## 2024-04-13 NOTE — Telephone Encounter (Signed)
 Called and documented in lab tab

## 2024-04-13 NOTE — Telephone Encounter (Signed)
 Copied from CRM [id number]. Topic: Clinical - Red Word Triage >> Apr 13, 2024 10:27 AM Magdalene School wrote: Red Word that prompted transfer to Nurse Triage: Severe pain, throwing up acid.   Chief Complaint: Abdominal Pain  Symptoms: Abdominal Pain, Vomiting,  Frequency: One Month  Pertinent Negatives: Patient denies fever, constipation, diarrhea  Disposition: [x] ED /[] Urgent Care (no appt availability in office) / [] Appointment(In office/virtual)/ []  [place] [facility]/ [] Home Care/ [] Refused Recommended Disposition /[] [place] [facility]/ []  Follow-up with PCP Additional Notes: [facility] and vomiting. Due to the nature of the pain and vomiting a yellow to pale green emesis twice daily. The symptoms have been present for a month now. The pain is in the umbilical area and moves to the right side towards the hip. Recommended ED due to the telephonic assessment.   Reason for Disposition  [1] SEVERE pain AND [2] age > 60 years  Answer Assessment - Initial Assessment Questions 1. LOCATION: "Where does it hurt?"      Umbilicial  2. RADIATION: "Does the pain shoot anywhere else?" (e.g., chest, back)     Right side  3. ONSET: "When did the pain begin?" (e.g., minutes, hours or days ago)      One month  4. SUDDEN: "Gradual or sudden onset?"     Sudden  5. PATTERN "Does the pain come and go, or is it constant?"    - If it comes and goes: "How long does it last?" "Do you have pain now?"     (Note: Comes and goes means the pain is intermittent. It goes away completely between bouts.)    - If constant: "Is it getting better, staying the same, or getting worse?"      (Note: Constant means the pain never goes away completely; most serious pain is constant and gets worse.)      Intermittent  6. SEVERITY: "How bad is the pain?"  (e.g., Scale 1-10; mild, moderate, or severe)    - MILD (1-3): Doesn't interfere with normal activities, abdomen soft and not tender to  touch.     - MODERATE (4-7): Interferes with normal activities or awakens from sleep, abdomen tender to touch.     - SEVERE (8-10): Excruciating pain, doubled over, unable to do any normal activities.       Moderate to Severe  7. RECURRENT SYMPTOM: "Have you ever had this type of stomach pain before?" If Yes, ask: "When was the last time?" and "What happened that time?"      yes  8. CAUSE: "What do you think is causing the stomach pain?"     Uhsure  9. RELIEVING/AGGRAVATING FACTORS: "What makes it better or worse?" (e.g., antacids, bending or twisting motion, bowel movement)     None  10. OTHER SYMPTOMS: "Do you have any other symptoms?" (e.g., back pain, diarrhea, fever, urination pain, vomiting)       Hip Pain, Vomiting  Protocols used: Abdominal Pain - Female-A-AH

## 2024-04-15 ENCOUNTER — Encounter: Payer: Self-pay | Admitting: Nurse Practitioner

## 2024-04-15 NOTE — Assessment & Plan Note (Signed)
 Chronic constipation is managed with Linzess  and dietary modifications. Dehydration may contribute to symptoms. Continue Linzess  as prescribed. Encourage increased water intake and dietary modifications.

## 2024-04-15 NOTE — Assessment & Plan Note (Addendum)
 Nipple discharge is milky and clear, with a differential including elevated prolactin levels. There is no evidence of bloody discharge. Order prolactin level and thyroid  function tests. If prolactin is elevated, discuss further management options. If normal, consider follow up with OBGYN for further evaluation.

## 2024-04-15 NOTE — Assessment & Plan Note (Signed)
 She experiences acid reflux with abdominal pain, belching, and potential bloating, currently managed with Protonix  once daily. Increase Protonix  to twice daily, morning and evening. Follow up with GI for EGD to evaluate for acid reflux or hiatal hernia.

## 2024-04-15 NOTE — Assessment & Plan Note (Signed)
 Chronic abdominal pain persists despite normal imaging and labs. The differential includes acid reflux, hiatal hernia, and potential ulcer, with no evidence of appendicitis or gallbladder issues. Increase Protonix  to twice daily, morning and evening.Follow up with GI for EGD to evaluate for ulcer, acid reflux, or hiatal hernia. Order an abdominal ultrasound to assess for liver, pancreas, or other abdominal issues. Encourage increased water intake and dietary modifications to identify potential food triggers.

## 2024-04-21 ENCOUNTER — Ambulatory Visit

## 2024-05-03 ENCOUNTER — Telehealth: Payer: Self-pay

## 2024-05-03 ENCOUNTER — Telehealth: Payer: Self-pay | Admitting: *Deleted

## 2024-05-03 NOTE — Telephone Encounter (Signed)
 Not sure if related to other telephone note, so I started a new encounter. See Access Nurse note below from Monday night:

## 2024-05-03 NOTE — Telephone Encounter (Signed)
 Copied from CRM [id number]. Topic: Clinical - Medication Question >> May 03, 2024 10:27 AM Earnestine Goes B wrote: Reason for CRM: pt mom called to advise dr Julietta Ogren pt was admitted and given clozapam, called to request medication for muscle spasm . States the pt will need to be on clozapam for the rest of her life, asking if provider will continue prescribing the medication please call shelia back at [phone]

## 2024-05-03 NOTE — Transitions of Care (Post Inpatient/ED Visit) (Unsigned)
   05/03/2024  Name: Jamie Ross MRN: [id number] DOB: Mar 30, 1986  Today's TOC FU Call Status: Today's TOC FU Call Status:: Unsuccessful Call (1st Attempt) Unsuccessful Call (1st Attempt) Date: 05/03/24  Attempted to reach the patient regarding the most recent Inpatient/ED visit.  Follow Up Plan: Additional outreach attempts will be made to reach the patient to complete the Transitions of Care (Post Inpatient/ED visit) call.   Signature Darrall Ellison, LPN [facility] Nurse Health Advisor Direct Dial [phone]

## 2024-05-04 NOTE — Transitions of Care (Post Inpatient/ED Visit) (Signed)
 05/04/2024  Name: Jamie Ross MRN: [id number] DOB: 08-31-1986  Today's TOC FU Call Status: Today's TOC FU Call Status:: Successful TOC FU Call Completed Unsuccessful Call (1st Attempt) Date: 05/03/24 [facility] FU Call Complete Date: 05/04/24 Patient's Name and Date of Birth confirmed.  Transition Care Management Follow-up Telephone Call Date of Discharge: 04/30/24 Discharge Facility: Other [facility]) Name of Other ([facility]) Discharge Facility: [facility] Type of Discharge: Inpatient Admission Primary Inpatient Discharge Diagnosis:: abd pain How have you been since you were released from the hospital?: Better Any questions or concerns?: No  Items Reviewed: Did you receive and understand the discharge instructions provided?: Yes Medications obtained,verified, and reconciled?: Yes (Medications Reviewed) Any new allergies since your discharge?: No Dietary orders reviewed?: Yes Do you have support at home?: Yes People in Home [RPT]: parent(s)  Medications Reviewed Today: Medications Reviewed Today     Reviewed by Darrall Ellison, LPN (Licensed Practical Nurse) on 05/04/24 at 1452  Med List Status: <None>   Medication Order Taking? Sig Documenting Provider Last Dose Status Informant  acetaminophen  (TYLENOL ) 650 MG CR tablet [id number] No Take 1,300 mg by mouth every 8 (eight) hours as needed. [provider] Taking Active   acetaminophen -codeine (TYLENOL  #3) 300-30 MG tablet [id number] No Take 1-2 tablets by mouth every 4 (four) hours as needed. [provider] Taking Active   albuterol  (VENTOLIN  HFA) 108 (90 Base) MCG/ACT inhaler [id number] No TAKE 2 PUFFS BY MOUTH EVERY 6 HOURS AS NEEDED FOR WHEEZE OR SHORTNESS OF Darilyn Edin, NP Taking Active   amantadine (SYMMETREL) 100 MG capsule [id number] No Take 100 mg by mouth 2 (two) times daily. [provider] Taking Active   Azelastine  HCl 137 MCG/SPRAY SOLN [id number] No PLACE 1 SPRAY INTO BOTH  NOSTRILS 2 (TWO) TIMES DAILY. USE IN EACH NOSTRIL AS DIRECTED Calista Catching, FNP Taking Active   budesonide -formoterol  (SYMBICORT ) 160-4.5 MCG/ACT inhaler [id number] No Inhale 2 puffs into the lungs 2 (two) times daily. Lissa Riding, NP Taking Active   Cyanocobalamin  (CVS B12 GUMMIES PO) [id number] No Take by mouth. [provider] Taking Active   fluticasone  (FLONASE ) 50 MCG/ACT nasal spray [id number] No 1 spray into each nostril daily. Bluford Burkitt, NP Taking Active   haloperidol  (HALDOL ) 10 MG tablet [id number] No Take 10 mg by mouth 2 (two) times daily. [provider] Taking Active   Discontinued 02/25/12 1157 (Error)   hydrOXYzine  (ATARAX ) 25 MG tablet [id number] No Take 12.5-25 mg by mouth 2 (two) times daily. [provider] Taking Active   LINZESS  72 MCG capsule [id number] No Take 72 mcg by mouth daily. [provider] Taking Active   meloxicam  (MOBIC ) 15 MG tablet [id number] No Take 1 tablet (15 mg total) by mouth daily. Bluford Burkitt, NP Taking Active   metoCLOPramide  (REGLAN ) 10 MG tablet [id number]  Take 1 tablet (10 mg total) by mouth every 8 (eight) hours as needed for nausea. Bluford Burkitt, NP  Active   pantoprazole  (PROTONIX ) 40 MG tablet [id number]  Take 1 tablet (40 mg total) by mouth 2 (two) times daily before a meal. Bluford Burkitt, NP  Active   Discontinued 02/25/12 1157 (Error)   prazosin  (MINIPRESS ) 5 MG capsule [id number] No Take 5 mg by mouth at bedtime. [provider] Taking Active   Probiotic Product Madalyn Scarce [facility]) CHEW [id number] No Chew 1 capsule by mouth as needed. [provider] Taking Active   propranolol  ER (INDERAL  LA) 60 MG 24 hr capsule [id number] No Take 1 capsule (60  mg total) by mouth daily. Bluford Burkitt, NP Taking Active   Semaglutide -Weight Management 0.25 MG/0.5ML SOAJ [id number] No Inject 0.25 mg into the skin once a week. Bluford Burkitt, NP Taking Active   Semaglutide -Weight Management 0.5 MG/0.5ML SOAJ  [id number] No Inject 0.5 mg into the skin once a week. Bluford Burkitt, NP Taking Active   sertraline (ZOLOFT) 100 MG tablet [id number] No Take 100 mg by mouth daily. [provider] Taking Active   tobramycin-dexamethasone [facility]) ophthalmic solution [id number] No Place 1 drop into the left eye 2 (two) times daily. [provider] Taking Active   traZODone  (DESYREL ) 150 MG tablet [id number] No Take 1 tablet (150 mg total) by mouth at bedtime. Bluford Burkitt, NP Taking Active   Med List Note (Ashe, Dana K, CPhT 01/28/16 1736): Patient is a resident of [facility]             Home Care and Equipment/Supplies: Were Home Health Services Ordered?: NA Any new equipment or medical supplies ordered?: NA  Functional Questionnaire: Do you need assistance with bathing/showering or dressing?: No Do you need assistance with meal preparation?: No Do you need assistance with eating?: No Do you have difficulty maintaining continence: No Do you need assistance with getting out of bed/getting out of a chair/moving?: No Do you have difficulty managing or taking your medications?: No  Follow up appointments reviewed: PCP Follow-up appointment confirmed?: No (sent message to staff to schedule) MD Provider Line Number:[phone] Given: No Specialist Hospital Follow-up appointment confirmed?: NA Do you need transportation to your follow-up appointment?: No Do you understand care options if your condition(s) worsen?: Yes-patient verbalized understanding    SIGNATURE Darrall Ellison, LPN [facility] Nurse Health Advisor Direct Dial [phone]

## 2024-05-06 ENCOUNTER — Ambulatory Visit
Admission: RE | Admit: 2024-05-06 | Discharge: 2024-05-06 | Disposition: A | Discharge status: Home / Self Care | Source: Ambulatory Visit | Attending: Nurse Practitioner | Admitting: Nurse Practitioner

## 2024-05-06 DIAGNOSIS — R7989 Other specified abnormal findings of blood chemistry: Secondary | ICD-10-CM | POA: Insufficient documentation

## 2024-05-06 MED ORDER — GADOBUTROL 1 MMOL/ML IV SOLN
9.0000 mL | Freq: Once | INTRAVENOUS | Status: AC | PRN
  Administered 2024-05-06: 9 mL via INTRAVENOUS

## 2024-05-10 ENCOUNTER — Encounter (INDEPENDENT_AMBULATORY_CARE_PROVIDER_SITE_OTHER): Payer: Self-pay | Admitting: Otolaryngology

## 2024-05-10 ENCOUNTER — Telehealth: Payer: Self-pay

## 2024-05-10 ENCOUNTER — Ambulatory Visit (INDEPENDENT_AMBULATORY_CARE_PROVIDER_SITE_OTHER): Admitting: Otolaryngology

## 2024-05-10 VITALS — BP 121/74 | HR 72 | Ht 64.0 in | Wt 206.0 lb

## 2024-05-10 DIAGNOSIS — Z91198 Patient's noncompliance with other medical treatment and regimen for other reason: Secondary | ICD-10-CM

## 2024-05-10 DIAGNOSIS — G4733 Obstructive sleep apnea (adult) (pediatric): Secondary | ICD-10-CM

## 2024-05-10 DIAGNOSIS — Z789 Other specified health status: Secondary | ICD-10-CM
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
## 2024-05-10 NOTE — Telephone Encounter (Signed)
 LMTCB. E2C2 please advise when patient calls back.

## 2024-05-10 NOTE — Telephone Encounter (Signed)
-----   Message from PALLAVI D REDDY sent at 05/10/2024  4:37 PM EDT ----- Regarding: FW: Thank you and Sleep Study Please complete order for in lab split night study and relay to patient that she needs in lab study first before proceeding with Inspire. ----- Message ----- From: Evelina Hippo, MD Sent: 05/10/2024  11:32 AM EDT To: Woodson He, MD Subject: Thank you and Sleep Study                      Hi Dr. Kieran Pellet, Thank you so much for referring this patient to us . I'm one of the new [facility] and have just started to do inspires so glad to see her! She is a good candidate, but unfortunately [facility] needs an in lab sleep study before we can proceed. Would you mind facilitating that from your end? We can do the DISE but can't do the implant without it Feel free to reach out if you need anything else Thank you, Kunjan Patel

## 2024-05-11 NOTE — Telephone Encounter (Signed)
 I spoke with the patient's mother, DPR- she said they are waiting to see if her insurance will approve the inspire device and how much they will have to pay out of pocket. It is is too expensive she will just go with the CPAP. They will let us  know if she would like to go ahead with the in lab sleep study and inspire device.  Nothing further needed.

## 2024-05-12 NOTE — Telephone Encounter (Signed)
 Copied from CRM [id number]. Topic: Clinical - Medical Advice >> May 12, 2024 12:01 PM Jamie Ross wrote: Reason for CRM: Patient has decided to go the old fashion way, instead of under her skin she wants the tube through her nose - per mother.

## 2024-05-17 ENCOUNTER — Ambulatory Visit (INDEPENDENT_AMBULATORY_CARE_PROVIDER_SITE_OTHER): Admitting: Nurse Practitioner

## 2024-05-17 ENCOUNTER — Emergency Department
Admission: EM | Admit: 2024-05-17 | Discharge: 2024-05-21 | Disposition: A | Discharge status: Home / Self Care | Attending: Emergency Medicine | Admitting: Emergency Medicine

## 2024-05-17 ENCOUNTER — Ambulatory Visit
Admission: EM | Admit: 2024-05-17 | Discharge: 2024-05-17 | Disposition: A | Discharge status: Home / Self Care | Source: Ambulatory Visit | Attending: Emergency Medicine | Admitting: Emergency Medicine

## 2024-05-17 VITALS — BP 110/66 | HR 69 | Temp 98.0°F | Ht 64.0 in | Wt 209.0 lb

## 2024-05-17 DIAGNOSIS — F4310 Post-traumatic stress disorder, unspecified: Secondary | ICD-10-CM | POA: Diagnosis present

## 2024-05-17 DIAGNOSIS — T7421XA Adult sexual abuse, confirmed, initial encounter: Secondary | ICD-10-CM | POA: Diagnosis present

## 2024-05-17 DIAGNOSIS — R6250 Unspecified lack of expected normal physiological development in childhood: Secondary | ICD-10-CM | POA: Diagnosis not present

## 2024-05-17 DIAGNOSIS — R110 Nausea: Secondary | ICD-10-CM

## 2024-05-17 DIAGNOSIS — I10 Essential (primary) hypertension: Secondary | ICD-10-CM | POA: Diagnosis not present

## 2024-05-17 DIAGNOSIS — Z0441 Encounter for examination and observation following alleged adult rape: Secondary | ICD-10-CM | POA: Diagnosis present

## 2024-05-17 DIAGNOSIS — F39 Unspecified mood [affective] disorder: Secondary | ICD-10-CM

## 2024-05-17 DIAGNOSIS — G8929 Other chronic pain: Secondary | ICD-10-CM

## 2024-05-17 DIAGNOSIS — R519 Headache, unspecified: Secondary | ICD-10-CM

## 2024-05-17 LAB — URINALYSIS, ROUTINE W REFLEX MICROSCOPIC
Bilirubin Urine: NEGATIVE
Glucose, UA: NEGATIVE mg/dL
Hgb urine dipstick: NEGATIVE
Ketones, ur: NEGATIVE mg/dL
Leukocytes,Ua: NEGATIVE
Nitrite: NEGATIVE
Protein, ur: NEGATIVE mg/dL
Specific Gravity, Urine: 1.004 — ABNORMAL LOW (ref 1.005–1.030)
pH: 6 (ref 5.0–8.0)

## 2024-05-17 LAB — PREGNANCY, URINE: Preg Test, Ur: NEGATIVE

## 2024-05-17 MED ORDER — ULIPRISTAL ACETATE 30 MG PO TABS
30.0000 mg | ORAL_TABLET | Freq: Once | ORAL | Status: AC
  Administered 2024-05-17: 30 mg via ORAL
  Filled 2024-05-17: qty 1

## 2024-05-17 NOTE — Addendum Note (Signed)
 Addended by: Pepper Boyer on: 05/17/2024 11:28 AM   Modules accepted: Orders

## 2024-05-17 NOTE — ED Notes (Signed)
 SANE  NURSE  CALLED  PER  MEGAN  RN

## 2024-05-17 NOTE — ED Notes (Signed)
 SANE nurse at bedside completing assessment.
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
## 2024-05-17 NOTE — Telephone Encounter (Signed)
 Order has been placed.  LMTCB. E2C2 please advise when patient calls back and schedule her a 3 month follow up.

## 2024-05-17 NOTE — ED Notes (Signed)
 Urine was NOT a clean catch. This RN instructed patient to only use toilet paper after urine sample. Pt urinated into a hat and it was transferred into a cup by this RN.

## 2024-05-17 NOTE — ED Notes (Signed)
 PD at bedside with patient. Patient denies any needs at this time. Call bell within reach.
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
## 2024-05-17 NOTE — ED Notes (Signed)
 Spoke with Genevia Kern from forensic nursing. Report given. ETA 1hr.
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
## 2024-05-18 ENCOUNTER — Other Ambulatory Visit: Payer: Self-pay

## 2024-05-18 DIAGNOSIS — T7421XA Adult sexual abuse, confirmed, initial encounter: Secondary | ICD-10-CM | POA: Diagnosis not present

## 2024-05-18 MED ORDER — HALOPERIDOL 5 MG PO TABS
10.0000 mg | ORAL_TABLET | Freq: Two times a day (BID) | ORAL | Status: DC
  Administered 2024-05-18 – 2024-05-19 (×3): 10 mg via ORAL
  Filled 2024-05-18 (×7): qty 2

## 2024-05-18 MED ORDER — MELATONIN 3 MG PO TABS: 3.0000 mg | ORAL_TABLET | Freq: Every day | ORAL | Status: DC

## 2024-05-18 MED ORDER — TRAZODONE HCL 50 MG PO TABS: 150.0000 mg | ORAL_TABLET | Freq: Every day | ORAL | Status: DC

## 2024-05-18 MED ORDER — PANTOPRAZOLE SODIUM 40 MG PO TBEC
40.0000 mg | DELAYED_RELEASE_TABLET | Freq: Two times a day (BID) | ORAL | Status: DC
  Administered 2024-05-19 (×2): 40 mg via ORAL
  Filled 2024-05-18 (×6): qty 1

## 2024-05-18 MED ORDER — SERTRALINE HCL 50 MG PO TABS
200.0000 mg | ORAL_TABLET | Freq: Every day | ORAL | Status: DC
  Administered 2024-05-18 – 2024-05-19 (×2): 200 mg via ORAL
  Filled 2024-05-18 (×4): qty 4

## 2024-05-18 MED ORDER — ACETAMINOPHEN 325 MG PO TABS
ORAL_TABLET | ORAL | Status: AC
  Filled 2024-05-18: qty 2

## 2024-05-18 MED ORDER — AMANTADINE HCL 100 MG PO CAPS
100.0000 mg | ORAL_CAPSULE | Freq: Two times a day (BID) | ORAL | Status: DC
  Administered 2024-05-18 – 2024-05-19 (×2): 100 mg via ORAL
  Filled 2024-05-18 (×9): qty 1

## 2024-05-18 MED ORDER — HYDROXYZINE HCL 25 MG PO TABS
25.0000 mg | ORAL_TABLET | Freq: Three times a day (TID) | ORAL | Status: DC | PRN
  Administered 2024-05-21: 25 mg via ORAL
  Filled 2024-05-18: qty 1

## 2024-05-18 MED ORDER — PRAZOSIN HCL 2 MG PO CAPS
2.0000 mg | ORAL_CAPSULE | Freq: Every day | ORAL | Status: DC
  Administered 2024-05-19: 2 mg via ORAL
  Filled 2024-05-18 (×3): qty 1
  Filled 2024-05-18: qty 2
  Filled 2024-05-18 (×2): qty 1

## 2024-05-18 MED ORDER — TRAZODONE HCL 50 MG PO TABS
150.0000 mg | ORAL_TABLET | Freq: Every day | ORAL | Status: DC
  Administered 2024-05-18 – 2024-05-19 (×3): 150 mg via ORAL
  Filled 2024-05-18 (×5): qty 1

## 2024-05-18 MED ORDER — ACETAMINOPHEN 325 MG PO TABS
650.0000 mg | ORAL_TABLET | ORAL | Status: DC | PRN
  Administered 2024-05-18: 650 mg via ORAL
  Filled 2024-05-18 (×2): qty 2

## 2024-05-18 MED ORDER — MELATONIN 5 MG PO TABS
2.5000 mg | ORAL_TABLET | Freq: Every day | ORAL | Status: DC
  Administered 2024-05-18 – 2024-05-19 (×3): 2.5 mg via ORAL
  Filled 2024-05-18 (×5): qty 1

## 2024-05-18 MED ORDER — CLONAZEPAM 0.5 MG PO TABS
0.5000 mg | ORAL_TABLET | Freq: Two times a day (BID) | ORAL | Status: DC
  Administered 2024-05-18 – 2024-05-19 (×3): 0.5 mg via ORAL
  Filled 2024-05-18 (×7): qty 1

## 2024-05-18 NOTE — ED Notes (Signed)
Case manager with patient.

## 2024-05-18 NOTE — ED Notes (Signed)
 Pt given cereal and milk and is eating sitting up at foot of bed.

## 2024-05-18 NOTE — ED Notes (Signed)
 Pt speaking with Henri Loft via telephone

## 2024-05-18 NOTE — ED Notes (Signed)
Parents at bedside visiting.

## 2024-05-18 NOTE — ED Provider Notes (Signed)
-----------------------------------------   7:31 AM on 05/18/2024 -----------------------------------------   Blood pressure 118/70, pulse 61, temperature 97.9 F (36.6 C), temperature source Oral, resp. rate 16, height 5' 2 (1.575 m), weight 94 kg, SpO2 96%.  The patient is calm and cooperative at this time.  There have been no acute events since the last update.  Awaiting disposition plan from Social Work team.   Antrone Walla J, MD 05/18/24 [phone]

## 2024-05-18 NOTE — ED Provider Notes (Signed)
 BP 120/76   Pulse 87   Temp 97.9 F (36.6 C) (Oral)   Resp 17   Ht 5' 2 (1.575 m)   Wt 94 kg   SpO2 95%   BMI 37.90 kg/m   The patient is calm and cooperative at this time. There have been no acute events since the last update. Awaiting disposition plan from Social Work team.    Collis Deaner, MD 05/18/24 [phone]
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
## 2024-05-19 DIAGNOSIS — T7421XA Adult sexual abuse, confirmed, initial encounter: Secondary | ICD-10-CM | POA: Diagnosis not present

## 2024-05-19 NOTE — ED Notes (Signed)
 Pt sleeping with eyes closed, even and unlabored breathing noted. Visible chest rise and fall.

## 2024-05-19 NOTE — TOC Progression Note (Addendum)
 Transition of Care [facility]) - Progression Note    Patient Details  Name: Jamie Ross MRN: [id number] Date of Birth: 1986/02/26  [facility] [place] CM/SW Contact  Elsie Halo, RN Phone Number: 05/19/2024, 1:01 PM  Clinical Narrative:     TOC received call from the patient's caseworker, Resa Cass. She started the group home search when the patient was at [facility], but her family decided to take her home. The group home bed search ceased at that time. She will be out today to complete the evaluation to reinitiate the search for a female Group Home bed. TOC will continue to follow.   TOC completed the FL2 for this patient.  Expected Discharge Plan: Group Home Barriers to Discharge: Continued Medical Work up  Expected Discharge Plan and Services   Discharge Planning Services: CM Consult   Living arrangements for the past 2 months: Single Family Home                                       Social Determinants of Health (SDOH) Interventions SDOH Screenings   Housing: Unknown (04/15/2024)   Received from [facility]  Depression [phone]): Low Risk  (02/04/2024)  Tobacco Use: Low Risk  (05/10/2024)    Readmission Risk Interventions     No data to display

## 2024-05-19 NOTE — ED Notes (Signed)
 Pt spilled coffee on her L thigh, Dr. Dodson Freestone at bedside to evaluate. Redness noted. Pt given lotion at this time to apply to area. No new orders per MD

## 2024-05-19 NOTE — ED Provider Notes (Signed)
-----------------------------------------   9:24 AM on 05/19/2024 -----------------------------------------   Blood pressure 109/64, pulse 87, temperature 98.4 F (36.9 C), temperature source Oral, resp. rate 17, height 5' 2 (1.575 m), weight 94 kg, SpO2 95%.  The patient is calm and cooperative at this time.  There have been no acute events since the last update.  Awaiting disposition plan from case management/social work.  This morning -spilled coffee on herself I evaluated with no abnormalities of the skin.    Viviano Ground, MD 05/19/24 [phone]

## 2024-05-20 DIAGNOSIS — T7421XA Adult sexual abuse, confirmed, initial encounter: Secondary | ICD-10-CM | POA: Diagnosis not present
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
## 2024-05-20 NOTE — ED Provider Notes (Signed)
-----------------------------------------   6:12 AM on 05/20/2024 -----------------------------------------   Blood pressure 122/70, pulse 96, temperature (!) 97.5 F (36.4 C), temperature source Oral, resp. rate 16, height 5' 2 (1.575 m), weight 94 kg, SpO2 93%.  The patient is calm and cooperative at this time.  There have been no acute events since the last update.  Awaiting disposition plan from case management/social work.   Arline Bennett, MD 05/20/24 [phone]
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
## 2024-05-20 NOTE — ED Notes (Signed)
 Pt given shower supplies and taken to shower by this tech. Pt cleaned herself while this tech stayed outside the shower and bathroom door. After pt finished, this tech walked her back to her room. Pt has no other needs at this time.

## 2024-05-20 NOTE — ED Notes (Signed)
 Pt given night meds.  Pt very pleasant, took meds.  All VSS

## 2024-05-20 NOTE — ED Notes (Signed)
 TOC involved

## 2024-05-20 NOTE — ED Notes (Signed)
Pt resting, no needs at this time.

## 2024-05-21 DIAGNOSIS — T7421XA Adult sexual abuse, confirmed, initial encounter: Secondary | ICD-10-CM | POA: Diagnosis not present

## 2024-05-21 DIAGNOSIS — F4310 Post-traumatic stress disorder, unspecified: Secondary | ICD-10-CM

## 2024-05-21 NOTE — ED Notes (Signed)
 Pt to desk asking for a nerve pill.
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
## 2024-05-21 NOTE — ED Notes (Signed)
 Pt asking for towels and wash cloth to take shower, pt has own shower supplies and change of clothes. Provided towels to pt and escorted to shower.
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
## 2024-05-21 NOTE — ED Notes (Signed)
 Pt is not dressed out per policy. This RN had pt dressed out per policy.  Purple shirt Black shorts Blue sandals Earrings

## 2024-05-21 NOTE — ED Notes (Signed)
 Pt returned to room

## 2024-05-21 NOTE — Discharge Instructions (Signed)
 Please follow-up with your doctor on Monday for recheck/reevaluation.  Return immediately to the emergency department for any concern.

## 2024-05-21 NOTE — ED Notes (Signed)
Pt given coke to drink 

## 2024-05-21 NOTE — ED Notes (Signed)
 PSYCH at bedside.
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
## 2024-05-30 ENCOUNTER — Encounter: Payer: Self-pay | Admitting: Nurse Practitioner

## 2024-05-30 MED ORDER — PROPRANOLOL HCL ER 60 MG PO CP24
60.0000 mg | ORAL_CAPSULE | Freq: Every day | ORAL | 3 refills | Status: AC
Start: 2024-05-30 — End: ?

## 2024-05-30 MED ORDER — ONDANSETRON 4 MG PO TBDP: 4.0000 mg | ORAL_TABLET | Freq: Three times a day (TID) | ORAL | 5 refills | Status: AC | PRN
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
## 2024-05-30 NOTE — Assessment & Plan Note (Addendum)
 Adequately controlled with Zofran  as needed. Continue. Refills sent.

## 2024-05-30 NOTE — Assessment & Plan Note (Signed)
 Blood pressure well controlled on Propranolol  ER 60 mg daily. Continue.

## 2024-06-11 ENCOUNTER — Other Ambulatory Visit: Payer: Self-pay | Admitting: Family

## 2024-06-11 DIAGNOSIS — H6993 Unspecified Eustachian tube disorder, bilateral: Secondary | ICD-10-CM

## 2024-06-13 ENCOUNTER — Ambulatory Visit: Payer: Self-pay | Admitting: Nurse Practitioner

## 2024-06-14 ENCOUNTER — Ambulatory Visit

## 2024-06-14 ENCOUNTER — Other Ambulatory Visit: Payer: Self-pay | Admitting: Nurse Practitioner

## 2024-06-14 DIAGNOSIS — D352 Benign neoplasm of pituitary gland: Secondary | ICD-10-CM

## 2024-06-14 DIAGNOSIS — R7989 Other specified abnormal findings of blood chemistry: Secondary | ICD-10-CM

## 2024-07-07 ENCOUNTER — Other Ambulatory Visit: Payer: Self-pay | Admitting: Nurse Practitioner

## 2024-07-07 DIAGNOSIS — H6993 Unspecified Eustachian tube disorder, bilateral: Secondary | ICD-10-CM

## 2024-07-13 ENCOUNTER — Other Ambulatory Visit: Payer: Self-pay | Admitting: Family

## 2024-07-13 DIAGNOSIS — F39 Unspecified mood [affective] disorder: Secondary | ICD-10-CM

## 2024-07-13 DIAGNOSIS — R1033 Periumbilical pain: Secondary | ICD-10-CM

## 2024-07-14 NOTE — Telephone Encounter (Signed)
 PLAN:Patient notified of labs and recommendations

## 2024-07-15 ENCOUNTER — Ambulatory Visit
Admission: RE | Admit: 2024-07-15 | Discharge: 2024-07-15 | Disposition: A | Discharge status: Home / Self Care | Source: Ambulatory Visit | Attending: Family | Admitting: Family

## 2024-07-15 DIAGNOSIS — F39 Unspecified mood [affective] disorder: Secondary | ICD-10-CM | POA: Diagnosis present

## 2024-07-15 DIAGNOSIS — R1033 Periumbilical pain: Secondary | ICD-10-CM | POA: Diagnosis present
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
## 2024-09-16 ENCOUNTER — Other Ambulatory Visit: Payer: Self-pay | Admitting: Nurse Practitioner

## 2024-09-16 DIAGNOSIS — G4700 Insomnia, unspecified: Secondary | ICD-10-CM

## 2024-09-27 ENCOUNTER — Encounter (INDEPENDENT_AMBULATORY_CARE_PROVIDER_SITE_OTHER): Payer: Self-pay | Admitting: Ophthalmology

## 2024-09-27 ENCOUNTER — Ambulatory Visit (INDEPENDENT_AMBULATORY_CARE_PROVIDER_SITE_OTHER): Payer: 59 | Admitting: Ophthalmology

## 2024-09-27 DIAGNOSIS — H47323 Drusen of optic disc, bilateral: Secondary | ICD-10-CM

## 2024-09-27 DIAGNOSIS — H35033 Hypertensive retinopathy, bilateral: Secondary | ICD-10-CM

## 2024-09-27 DIAGNOSIS — H50112 Monocular exotropia, left eye: Secondary | ICD-10-CM

## 2024-09-27 DIAGNOSIS — I10 Essential (primary) hypertension: Secondary | ICD-10-CM

## 2024-09-27 DIAGNOSIS — H3589 Other specified retinal disorders: Secondary | ICD-10-CM

## 2024-09-27 DIAGNOSIS — H53002 Unspecified amblyopia, left eye: Secondary | ICD-10-CM

## 2024-09-28 ENCOUNTER — Other Ambulatory Visit: Payer: Self-pay | Admitting: Nurse Practitioner

## 2024-09-28 DIAGNOSIS — K219 Gastro-esophageal reflux disease without esophagitis: Secondary | ICD-10-CM

## 2024-10-21 ENCOUNTER — Other Ambulatory Visit: Payer: Self-pay | Admitting: Orthopedic Surgery

## 2024-10-21 DIAGNOSIS — M75102 Unspecified rotator cuff tear or rupture of left shoulder, not specified as traumatic: Secondary | ICD-10-CM

## 2024-10-21 DIAGNOSIS — R29898 Other symptoms and signs involving the musculoskeletal system: Secondary | ICD-10-CM

## 2024-10-21 DIAGNOSIS — M25512 Pain in left shoulder: Secondary | ICD-10-CM

## 2024-10-25 ENCOUNTER — Ambulatory Visit
Admission: RE | Admit: 2024-10-25 | Discharge: 2024-10-25 | Disposition: A | Discharge status: Home / Self Care | Source: Ambulatory Visit | Attending: Orthopedic Surgery | Admitting: Orthopedic Surgery

## 2024-10-25 DIAGNOSIS — M25512 Pain in left shoulder: Secondary | ICD-10-CM | POA: Diagnosis present

## 2024-10-25 DIAGNOSIS — M75102 Unspecified rotator cuff tear or rupture of left shoulder, not specified as traumatic: Secondary | ICD-10-CM | POA: Insufficient documentation

## 2024-10-25 DIAGNOSIS — R29898 Other symptoms and signs involving the musculoskeletal system: Secondary | ICD-10-CM | POA: Insufficient documentation

## 2024-11-12 ENCOUNTER — Other Ambulatory Visit: Payer: Self-pay | Admitting: Nurse Practitioner

## 2024-11-12 DIAGNOSIS — K5909 Other constipation: Secondary | ICD-10-CM

## 2024-12-14 ENCOUNTER — Ambulatory Visit
Admission: RE | Admit: 2024-12-14 | Discharge: 2024-12-14 | Disposition: A | Discharge status: Home / Self Care | Source: Ambulatory Visit | Attending: Family | Admitting: Family

## 2024-12-14 ENCOUNTER — Other Ambulatory Visit: Payer: Self-pay | Admitting: Family

## 2024-12-14 DIAGNOSIS — R1084 Generalized abdominal pain: Secondary | ICD-10-CM | POA: Insufficient documentation

## 2024-12-14 DIAGNOSIS — K219 Gastro-esophageal reflux disease without esophagitis: Secondary | ICD-10-CM | POA: Insufficient documentation

## 2024-12-14 DIAGNOSIS — R1013 Epigastric pain: Secondary | ICD-10-CM

## 2024-12-27 ENCOUNTER — Other Ambulatory Visit: Payer: Self-pay

## 2024-12-27 ENCOUNTER — Ambulatory Visit: Admitting: Anesthesiology

## 2024-12-27 ENCOUNTER — Ambulatory Visit
Admission: RE | Admit: 2024-12-27 | Discharge: 2024-12-27 | Disposition: A | Attending: Gastroenterology | Admitting: Gastroenterology

## 2024-12-27 ENCOUNTER — Encounter: Payer: Self-pay | Admitting: Gastroenterology

## 2024-12-27 ENCOUNTER — Encounter
Admission: RE | Disposition: A | Discharge status: Home / Self Care | Payer: Self-pay | Source: Home / Self Care | Attending: Gastroenterology

## 2024-12-27 DIAGNOSIS — K219 Gastro-esophageal reflux disease without esophagitis: Secondary | ICD-10-CM | POA: Diagnosis not present

## 2024-12-27 DIAGNOSIS — J4489 Other specified chronic obstructive pulmonary disease: Secondary | ICD-10-CM | POA: Insufficient documentation

## 2024-12-27 DIAGNOSIS — G4730 Sleep apnea, unspecified: Secondary | ICD-10-CM | POA: Insufficient documentation

## 2024-12-27 DIAGNOSIS — R197 Diarrhea, unspecified: Secondary | ICD-10-CM | POA: Diagnosis not present

## 2024-12-27 DIAGNOSIS — K2970 Gastritis, unspecified, without bleeding: Secondary | ICD-10-CM | POA: Insufficient documentation

## 2024-12-27 DIAGNOSIS — R1013 Epigastric pain: Secondary | ICD-10-CM | POA: Diagnosis present

## 2024-12-27 DIAGNOSIS — K3189 Other diseases of stomach and duodenum: Secondary | ICD-10-CM | POA: Diagnosis not present

## 2024-12-27 DIAGNOSIS — F32A Depression, unspecified: Secondary | ICD-10-CM | POA: Diagnosis not present

## 2024-12-27 DIAGNOSIS — F419 Anxiety disorder, unspecified: Secondary | ICD-10-CM | POA: Insufficient documentation

## 2024-12-27 HISTORY — PX: ESOPHAGOGASTRODUODENOSCOPY: SHX5428

## 2024-12-27 HISTORY — PX: SKIN BIOPSY: SHX1

## 2024-12-27 LAB — POCT PREGNANCY, URINE: Preg Test, Ur: NEGATIVE

## 2024-12-27 MED ORDER — DEXMEDETOMIDINE HCL IN NACL 80 MCG/20ML IV SOLN
INTRAVENOUS | Status: DC | PRN
  Administered 2024-12-27: 12 ug via INTRAVENOUS

## 2024-12-27 MED ORDER — LIDOCAINE HCL (CARDIAC) PF 100 MG/5ML IV SOSY
PREFILLED_SYRINGE | INTRAVENOUS | Status: DC | PRN
  Administered 2024-12-27: 50 mg via INTRAVENOUS

## 2024-12-27 MED ORDER — PROPOFOL 10 MG/ML IV BOLUS
INTRAVENOUS | Status: DC | PRN
  Administered 2024-12-27 (×3): 20 mg via INTRAVENOUS
  Administered 2024-12-27: 80 mg via INTRAVENOUS

## 2024-12-27 MED ORDER — SODIUM CHLORIDE 0.9 % IV SOLN: INTRAVENOUS | Status: DC
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
## 2024-12-27 NOTE — Transfer of Care (Signed)
 Immediate Anesthesia Transfer of Care Note  Patient: Jamie Ross  Procedure(s) Performed: EGD (ESOPHAGOGASTRODUODENOSCOPY) BIOPSY, SKIN  Patient Location: PACU and Endoscopy Unit  Anesthesia Type:General  Level of Consciousness: drowsy and patient cooperative  Airway & Oxygen  Therapy: Patient Spontanous Breathing  Post-op Assessment: Report given to RN and Post -op Vital signs reviewed and stable  Post vital signs: Reviewed and stable  Last Vitals:  Vitals Value Taken Time  BP 143/88 12/27/24 12:00  Temp    Pulse    Resp    SpO2 90 % 12/27/24 12:00    Last Pain:  Vitals:   12/27/24 1049  TempSrc: Temporal  PainSc: 0-No pain         Complications: No notable events documented.
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
## 2024-12-27 NOTE — Op Note (Signed)
 [facility] Patient Name: Jamie Ross Procedure Date: 12/27/2024 11:29 AM MRN: [id number] Account #: [id number] Date of Birth: Sep 07, 1986 Admit Type: Outpatient Age: 39 Room: [facility] ENDO ROOM 3 Gender: Female Note Status: Finalized Instrument Name: Endoscope [id number] Procedure:             Upper GI endoscopy Indications:           Epigastric abdominal pain, Esophageal reflux symptoms                         that persist despite appropriate therapy, Diarrhea Providers:             Corinn Jess Brooklyn MD, MD Referring MD:          No Local Md, MD (Referring MD) Medicines:             General Anesthesia Complications:         No immediate complications. Estimated blood loss: None. Procedure:             Pre-Anesthesia Assessment:                        - Prior to the procedure, a History and Physical was                         performed, and patient medications and allergies were                         reviewed. The patient is competent. The risks and                         benefits of the procedure and the sedation options and                         risks were discussed with the patient. All questions                         were answered and informed consent was obtained.                         Patient identification and proposed procedure were                         verified by the physician, the nurse, the                         anesthesiologist, the anesthetist and the technician                         in the pre-procedure area in the procedure room in the                         endoscopy suite. Mental Status Examination: alert and                         oriented. Airway Examination: normal oropharyngeal                         airway and neck mobility. Respiratory Examination:  clear to auscultation. CV Examination: normal.                         Prophylactic Antibiotics: The patient does not require                          prophylactic antibiotics. Prior Anticoagulants: The                         patient has taken no anticoagulant or antiplatelet                         agents. ASA Grade Assessment: III - A patient with                         severe systemic disease. After reviewing the risks and                         benefits, the patient was deemed in satisfactory                         condition to undergo the procedure. The anesthesia                         plan was to use general anesthesia. Immediately prior                         to administration of medications, the patient was                         re-assessed for adequacy to receive sedatives. The                         heart rate, respiratory rate, oxygen  saturations,                         blood pressure, adequacy of pulmonary ventilation, and                         response to care were monitored throughout the                         procedure. The physical status of the patient was                         re-assessed after the procedure.                        After obtaining informed consent, the endoscope was                         passed under direct vision. Throughout the procedure,                         the patient's blood pressure, pulse, and oxygen                          saturations were monitored continuously. The Endoscope  was introduced through the mouth, and advanced to the                         second part of duodenum. The upper GI endoscopy was                         accomplished without difficulty. The patient tolerated                         the procedure well. Findings:      The duodenal bulb and second portion of the duodenum were normal.       Biopsies for histology were taken with a cold forceps for evaluation of       celiac disease.      Striped mildly erythematous mucosa without bleeding was found in the       gastric body. Biopsies were taken with a cold forceps  for Helicobacter       pylori testing.      The incisura and gastric antrum were normal. Biopsies were taken with a       cold forceps for Helicobacter pylori testing.      The cardia and gastric fundus were normal on retroflexion.      Esophagogastric landmarks were identified: the gastroesophageal junction       was found at 38 cm from the incisors.      The gastroesophageal junction and examined esophagus were normal.       Biopsies were taken with a cold forceps for histology. Impression:            - Normal duodenal bulb and second portion of the                         duodenum. Biopsied.                        - Erythematous mucosa in the gastric body. Biopsied.                        - Normal incisura and antrum. Biopsied.                        - Esophagogastric landmarks identified.                        - Normal gastroesophageal junction and esophagus.                         Biopsied. Recommendation:        - Await pathology results.                        - Discharge patient to home (with escort).                        - Resume previous diet today.                        - Continue present medications. Procedure Code(s):     --- Professional ---                        [phone], Esophagogastroduodenoscopy, flexible,  transoral; with biopsy, single or multiple Diagnosis Code(s):     --- Professional ---                        K31.89, Other diseases of stomach and duodenum                        R10.13, Epigastric pain                        R19.7, Diarrhea, unspecified                        K21.9, Gastro-esophageal reflux disease without                         esophagitis CPT copyright 2022 American Medical Association. All rights reserved. The codes documented in this report are preliminary and upon coder review may  be revised to meet current compliance requirements. Dr. Corinn Brooklyn Corinn Jess Brooklyn MD, MD 12/27/2024 11:54:37 AM This report  has been signed electronically. Number of Addenda: 0 Note Initiated On: 12/27/2024 11:29 AM Estimated Blood Loss:  Estimated blood loss: none.      [facility]

## 2024-12-28 LAB — SURGICAL PATHOLOGY

## 2024-12-29 ENCOUNTER — Ambulatory Visit: Payer: Self-pay | Admitting: Gastroenterology

## 2025-01-09 NOTE — Anesthesia Postprocedure Evaluation (Signed)
"   Anesthesia Post Note  Patient: Jamie Ross  Procedure(s) Performed: EGD (ESOPHAGOGASTRODUODENOSCOPY) BIOPSY, GI  Patient location during evaluation: PACU Anesthesia Type: General Level of consciousness: awake and alert Pain management: pain level controlled Vital Signs Assessment: post-procedure vital signs reviewed and stable Respiratory status: spontaneous breathing, nonlabored ventilation, respiratory function stable and patient connected to nasal cannula oxygen  Cardiovascular status: blood pressure returned to baseline and stable Postop Assessment: no apparent nausea or vomiting Anesthetic complications: no   No notable events documented.   Last Vitals:  Vitals:   12/27/24 1209 12/27/24 1219  BP: 130/76 (!) 137/97  Pulse: 65 65  Resp: (!) 24 19  Temp:    SpO2: 93% 94%    Last Pain:  Vitals:   12/27/24 1209  TempSrc:   PainSc: 0-No pain                 Lynwood KANDICE Clause      "
